# Patient Record
Sex: Female | Born: 1945 | Race: White | Hispanic: No | Marital: Married | Smoking: Never smoker
Health system: Southern US, Community
[De-identification: ages and names within clinical notes are randomized; demographics above are authoritative.]

## PROBLEM LIST (undated history)

## (undated) DIAGNOSIS — Z9889 Other specified postprocedural states: Secondary | ICD-10-CM

## (undated) DIAGNOSIS — M545 Low back pain, unspecified: Secondary | ICD-10-CM

## (undated) DIAGNOSIS — R740 Nonspecific elevation of levels of transaminase and lactic acid dehydrogenase [LDH]: Secondary | ICD-10-CM

## (undated) DIAGNOSIS — R002 Palpitations: Secondary | ICD-10-CM

## (undated) DIAGNOSIS — R142 Eructation: Secondary | ICD-10-CM

## (undated) DIAGNOSIS — M2550 Pain in unspecified joint: Secondary | ICD-10-CM

## (undated) DIAGNOSIS — R7309 Other abnormal glucose: Secondary | ICD-10-CM

## (undated) DIAGNOSIS — F411 Generalized anxiety disorder: Secondary | ICD-10-CM

## (undated) DIAGNOSIS — R5383 Other fatigue: Secondary | ICD-10-CM

## (undated) DIAGNOSIS — Z87440 Personal history of urinary (tract) infections: Secondary | ICD-10-CM

## (undated) DIAGNOSIS — R0789 Other chest pain: Secondary | ICD-10-CM

## (undated) DIAGNOSIS — R7401 Elevation of levels of liver transaminase levels: Secondary | ICD-10-CM

## (undated) DIAGNOSIS — K449 Diaphragmatic hernia without obstruction or gangrene: Secondary | ICD-10-CM

## (undated) DIAGNOSIS — R143 Flatulence: Secondary | ICD-10-CM

## (undated) DIAGNOSIS — R112 Nausea with vomiting, unspecified: Secondary | ICD-10-CM

## (undated) DIAGNOSIS — C679 Malignant neoplasm of bladder, unspecified: Secondary | ICD-10-CM

## (undated) DIAGNOSIS — R141 Gas pain: Secondary | ICD-10-CM

## (undated) DIAGNOSIS — R269 Unspecified abnormalities of gait and mobility: Secondary | ICD-10-CM

## (undated) DIAGNOSIS — R5381 Other malaise: Secondary | ICD-10-CM

## (undated) DIAGNOSIS — F0280 Dementia in other diseases classified elsewhere without behavioral disturbance: Secondary | ICD-10-CM

## (undated) DIAGNOSIS — K219 Gastro-esophageal reflux disease without esophagitis: Secondary | ICD-10-CM

## (undated) DIAGNOSIS — N3281 Overactive bladder: Secondary | ICD-10-CM

## (undated) DIAGNOSIS — R1013 Epigastric pain: Secondary | ICD-10-CM

## (undated) DIAGNOSIS — E785 Hyperlipidemia, unspecified: Secondary | ICD-10-CM

## (undated) DIAGNOSIS — G2581 Restless legs syndrome: Secondary | ICD-10-CM

## (undated) DIAGNOSIS — G3183 Dementia with Lewy bodies: Secondary | ICD-10-CM

## (undated) DIAGNOSIS — Z8669 Personal history of other diseases of the nervous system and sense organs: Secondary | ICD-10-CM

## (undated) DIAGNOSIS — M1990 Unspecified osteoarthritis, unspecified site: Secondary | ICD-10-CM

## (undated) DIAGNOSIS — R7402 Elevation of levels of lactic acid dehydrogenase (LDH): Secondary | ICD-10-CM

## (undated) DIAGNOSIS — M542 Cervicalgia: Secondary | ICD-10-CM

## (undated) DIAGNOSIS — E559 Vitamin D deficiency, unspecified: Secondary | ICD-10-CM

## (undated) DIAGNOSIS — K589 Irritable bowel syndrome without diarrhea: Secondary | ICD-10-CM

## (undated) HISTORY — DX: Other fatigue: R53.83

## (undated) HISTORY — DX: Cervicalgia: M54.2

## (undated) HISTORY — DX: Gastro-esophageal reflux disease without esophagitis: K21.9

## (undated) HISTORY — DX: Other chest pain: R07.89

## (undated) HISTORY — DX: Eructation: R14.2

## (undated) HISTORY — DX: Flatulence: R14.3

## (undated) HISTORY — PX: APPENDECTOMY: SHX54

## (undated) HISTORY — PX: TRIGGER FINGER RELEASE: SHX641

## (undated) HISTORY — PX: OTHER SURGICAL HISTORY: SHX169

## (undated) HISTORY — DX: Generalized anxiety disorder: F41.1

## (undated) HISTORY — DX: Epigastric pain: R10.13

## (undated) HISTORY — DX: Pain in unspecified joint: M25.50

## (undated) HISTORY — DX: Restless legs syndrome: G25.81

## (undated) HISTORY — DX: Overactive bladder: N32.81

## (undated) HISTORY — PX: BLADDER TUMOR EXCISION: SHX238

## (undated) HISTORY — DX: Malignant neoplasm of bladder, unspecified: C67.9

## (undated) HISTORY — DX: Personal history of other diseases of the nervous system and sense organs: Z86.69

## (undated) HISTORY — DX: Low back pain: M54.5

## (undated) HISTORY — DX: Other malaise: R53.81

## (undated) HISTORY — DX: Unspecified abnormalities of gait and mobility: R26.9

## (undated) HISTORY — DX: Vitamin D deficiency, unspecified: E55.9

## (undated) HISTORY — PX: LUMBAR LAMINECTOMY: SHX95

## (undated) HISTORY — DX: Irritable bowel syndrome, unspecified: K58.9

## (undated) HISTORY — DX: Palpitations: R00.2

## (undated) HISTORY — DX: Hyperlipidemia, unspecified: E78.5

## (undated) HISTORY — DX: Diaphragmatic hernia without obstruction or gangrene: K44.9

## (undated) HISTORY — PX: UPPER GI ENDOSCOPY: SHX6162

## (undated) HISTORY — DX: Low back pain, unspecified: M54.50

## (undated) HISTORY — DX: Elevation of levels of lactic acid dehydrogenase (LDH): R74.02

## (undated) HISTORY — DX: Dementia in other diseases classified elsewhere, unspecified severity, without behavioral disturbance, psychotic disturbance, mood disturbance, and anxiety: F02.80

## (undated) HISTORY — DX: Flatulence: R14.1

## (undated) HISTORY — PX: BUNIONECTOMY: SHX129

## (undated) HISTORY — DX: Dementia with Lewy bodies: G31.83

## (undated) HISTORY — DX: Elevation of levels of liver transaminase levels: R74.01

## (undated) HISTORY — DX: Nonspecific elevation of levels of transaminase and lactic acid dehydrogenase (ldh): R74.0

## (undated) HISTORY — DX: Other abnormal glucose: R73.09

## (undated) HISTORY — PX: BREAST SURGERY: SHX581

---

## 1998-06-04 ENCOUNTER — Other Ambulatory Visit: Admission: RE | Admit: 1998-06-04 | Discharge: 1998-06-04 | Payer: Self-pay | Admitting: Obstetrics and Gynecology

## 1998-10-04 ENCOUNTER — Other Ambulatory Visit: Admission: RE | Admit: 1998-10-04 | Discharge: 1998-10-04 | Payer: Self-pay | Admitting: Gastroenterology

## 1998-10-04 ENCOUNTER — Encounter (INDEPENDENT_AMBULATORY_CARE_PROVIDER_SITE_OTHER): Payer: Self-pay

## 1998-12-13 ENCOUNTER — Encounter: Admission: RE | Admit: 1998-12-13 | Discharge: 1999-03-13 | Payer: Self-pay | Admitting: Gastroenterology

## 1999-10-11 ENCOUNTER — Other Ambulatory Visit: Admission: RE | Admit: 1999-10-11 | Discharge: 1999-10-11 | Payer: Self-pay | Admitting: Obstetrics and Gynecology

## 2001-02-19 ENCOUNTER — Other Ambulatory Visit: Admission: RE | Admit: 2001-02-19 | Discharge: 2001-02-19 | Payer: Self-pay | Admitting: Obstetrics and Gynecology

## 2003-01-23 ENCOUNTER — Other Ambulatory Visit: Admission: RE | Admit: 2003-01-23 | Discharge: 2003-01-23 | Payer: Self-pay | Admitting: Obstetrics and Gynecology

## 2004-03-02 ENCOUNTER — Other Ambulatory Visit: Admission: RE | Admit: 2004-03-02 | Discharge: 2004-03-02 | Payer: Self-pay | Admitting: Obstetrics and Gynecology

## 2004-03-08 ENCOUNTER — Ambulatory Visit: Payer: Self-pay | Admitting: Gastroenterology

## 2004-04-04 ENCOUNTER — Ambulatory Visit: Payer: Self-pay | Admitting: Gastroenterology

## 2004-04-22 ENCOUNTER — Ambulatory Visit: Payer: Self-pay | Admitting: Gastroenterology

## 2004-05-06 ENCOUNTER — Ambulatory Visit: Payer: Self-pay | Admitting: Gastroenterology

## 2004-11-21 ENCOUNTER — Ambulatory Visit: Payer: Self-pay | Admitting: Internal Medicine

## 2005-02-06 ENCOUNTER — Encounter (INDEPENDENT_AMBULATORY_CARE_PROVIDER_SITE_OTHER): Payer: Self-pay | Admitting: *Deleted

## 2005-03-06 ENCOUNTER — Other Ambulatory Visit: Admission: RE | Admit: 2005-03-06 | Discharge: 2005-03-06 | Payer: Self-pay | Admitting: Obstetrics and Gynecology

## 2005-09-27 ENCOUNTER — Ambulatory Visit: Payer: Self-pay | Admitting: Internal Medicine

## 2005-10-04 ENCOUNTER — Ambulatory Visit: Payer: Self-pay

## 2005-10-16 ENCOUNTER — Ambulatory Visit: Payer: Self-pay | Admitting: Internal Medicine

## 2006-04-05 ENCOUNTER — Other Ambulatory Visit: Admission: RE | Admit: 2006-04-05 | Discharge: 2006-04-05 | Payer: Self-pay | Admitting: Obstetrics and Gynecology

## 2006-05-01 ENCOUNTER — Encounter: Admission: RE | Admit: 2006-05-01 | Discharge: 2006-05-01 | Payer: Self-pay | Admitting: Obstetrics and Gynecology

## 2006-05-09 ENCOUNTER — Ambulatory Visit: Payer: Self-pay | Admitting: Internal Medicine

## 2006-05-11 ENCOUNTER — Ambulatory Visit: Payer: Self-pay | Admitting: Internal Medicine

## 2006-05-11 LAB — CONVERTED CEMR LAB
ALT: 32 units/L (ref 0–40)
Total CHOL/HDL Ratio: 3.2
Triglycerides: 117 mg/dL (ref 0–149)
VLDL: 23 mg/dL (ref 0–40)

## 2006-05-14 ENCOUNTER — Ambulatory Visit: Payer: Self-pay | Admitting: Internal Medicine

## 2006-12-13 ENCOUNTER — Telehealth (INDEPENDENT_AMBULATORY_CARE_PROVIDER_SITE_OTHER): Payer: Self-pay | Admitting: *Deleted

## 2007-01-07 ENCOUNTER — Encounter (INDEPENDENT_AMBULATORY_CARE_PROVIDER_SITE_OTHER): Payer: Self-pay | Admitting: *Deleted

## 2007-01-07 DIAGNOSIS — E785 Hyperlipidemia, unspecified: Secondary | ICD-10-CM

## 2007-01-07 DIAGNOSIS — K219 Gastro-esophageal reflux disease without esophagitis: Secondary | ICD-10-CM | POA: Insufficient documentation

## 2007-01-07 DIAGNOSIS — Z8669 Personal history of other diseases of the nervous system and sense organs: Secondary | ICD-10-CM | POA: Insufficient documentation

## 2007-04-02 ENCOUNTER — Encounter: Admission: RE | Admit: 2007-04-02 | Discharge: 2007-04-02 | Payer: Self-pay | Admitting: Obstetrics and Gynecology

## 2007-04-15 ENCOUNTER — Other Ambulatory Visit: Admission: RE | Admit: 2007-04-15 | Discharge: 2007-04-15 | Payer: Self-pay | Admitting: Obstetrics and Gynecology

## 2007-04-26 ENCOUNTER — Ambulatory Visit: Payer: Self-pay | Admitting: Internal Medicine

## 2007-04-26 DIAGNOSIS — R0789 Other chest pain: Secondary | ICD-10-CM | POA: Insufficient documentation

## 2007-04-26 DIAGNOSIS — R002 Palpitations: Secondary | ICD-10-CM | POA: Insufficient documentation

## 2007-04-29 ENCOUNTER — Encounter (INDEPENDENT_AMBULATORY_CARE_PROVIDER_SITE_OTHER): Payer: Self-pay | Admitting: *Deleted

## 2007-05-06 ENCOUNTER — Encounter (INDEPENDENT_AMBULATORY_CARE_PROVIDER_SITE_OTHER): Payer: Self-pay | Admitting: *Deleted

## 2007-09-12 ENCOUNTER — Telehealth (INDEPENDENT_AMBULATORY_CARE_PROVIDER_SITE_OTHER): Payer: Self-pay | Admitting: *Deleted

## 2008-05-05 ENCOUNTER — Encounter: Admission: RE | Admit: 2008-05-05 | Discharge: 2008-05-05 | Payer: Self-pay | Admitting: Obstetrics and Gynecology

## 2008-05-13 ENCOUNTER — Other Ambulatory Visit: Admission: RE | Admit: 2008-05-13 | Discharge: 2008-05-13 | Payer: Self-pay | Admitting: Obstetrics and Gynecology

## 2008-05-13 ENCOUNTER — Encounter: Payer: Self-pay | Admitting: Obstetrics and Gynecology

## 2008-05-13 ENCOUNTER — Ambulatory Visit: Payer: Self-pay | Admitting: Obstetrics and Gynecology

## 2008-06-24 ENCOUNTER — Telehealth (INDEPENDENT_AMBULATORY_CARE_PROVIDER_SITE_OTHER): Payer: Self-pay | Admitting: *Deleted

## 2008-06-25 ENCOUNTER — Telehealth (INDEPENDENT_AMBULATORY_CARE_PROVIDER_SITE_OTHER): Payer: Self-pay | Admitting: *Deleted

## 2008-07-01 ENCOUNTER — Ambulatory Visit: Payer: Self-pay | Admitting: Internal Medicine

## 2008-07-01 LAB — CONVERTED CEMR LAB
Bilirubin Urine: NEGATIVE
Ketones, urine, test strip: NEGATIVE
Specific Gravity, Urine: <1.005
Urobilinogen, UA: 0.2
pH: 8

## 2008-07-05 LAB — CONVERTED CEMR LAB
ALT: 127 units/L — ABNORMAL HIGH (ref 0–35)
AST: 72 units/L — ABNORMAL HIGH (ref 0–37)
Albumin: 4.2 g/dL (ref 3.5–5.2)
Alkaline Phosphatase: 83 units/L (ref 39–117)
Basophils Absolute: 0 10*3/uL (ref 0.0–0.1)
Calcium: 9.4 mg/dL (ref 8.4–10.5)
Cholesterol: 196 mg/dL (ref 0–200)
Creatinine, Ser: 0.8 mg/dL (ref 0.4–1.2)
Eosinophils Absolute: 0.1 10*3/uL (ref 0.0–0.7)
Hgb A1c MFr Bld: 5.9 % (ref 4.6–6.5)
Lymphocytes Relative: 36.6 % (ref 12.0–46.0)
Lymphs Abs: 1.7 10*3/uL (ref 0.7–4.0)
MCHC: 35.1 g/dL (ref 30.0–36.0)
Monocytes Relative: 6.6 % (ref 3.0–12.0)
Platelets: 244 10*3/uL (ref 150.0–400.0)
RDW: 12.9 % (ref 11.5–14.6)
TSH: 2.04 microintl units/mL (ref 0.35–5.50)
Total Protein: 7.1 g/dL (ref 6.0–8.3)
Triglycerides: 170 mg/dL — ABNORMAL HIGH (ref 0.0–149.0)
Vitamin B-12: 240 pg/mL (ref 211–911)

## 2008-07-07 ENCOUNTER — Encounter (INDEPENDENT_AMBULATORY_CARE_PROVIDER_SITE_OTHER): Payer: Self-pay | Admitting: *Deleted

## 2008-07-20 ENCOUNTER — Ambulatory Visit: Payer: Self-pay | Admitting: Internal Medicine

## 2008-07-20 DIAGNOSIS — F411 Generalized anxiety disorder: Secondary | ICD-10-CM | POA: Insufficient documentation

## 2008-07-20 DIAGNOSIS — R5381 Other malaise: Secondary | ICD-10-CM

## 2008-07-20 DIAGNOSIS — R740 Nonspecific elevation of levels of transaminase and lactic acid dehydrogenase [LDH]: Secondary | ICD-10-CM

## 2008-07-20 DIAGNOSIS — R5383 Other fatigue: Secondary | ICD-10-CM

## 2008-08-04 ENCOUNTER — Ambulatory Visit: Payer: Self-pay | Admitting: Internal Medicine

## 2008-08-05 LAB — CONVERTED CEMR LAB
Albumin: 4.1 g/dL (ref 3.5–5.2)
Alkaline Phosphatase: 50 units/L (ref 39–117)
Bilirubin, Direct: 0.1 mg/dL (ref 0.0–0.3)

## 2008-08-06 ENCOUNTER — Ambulatory Visit: Payer: Self-pay | Admitting: Internal Medicine

## 2008-08-06 ENCOUNTER — Encounter (INDEPENDENT_AMBULATORY_CARE_PROVIDER_SITE_OTHER): Payer: Self-pay | Admitting: *Deleted

## 2008-08-06 LAB — CONVERTED CEMR LAB
HDL goal, serum: 50 mg/dL
LDL Goal: 100 mg/dL

## 2008-10-22 ENCOUNTER — Ambulatory Visit: Payer: Self-pay | Admitting: Internal Medicine

## 2008-10-22 DIAGNOSIS — M542 Cervicalgia: Secondary | ICD-10-CM

## 2008-10-22 DIAGNOSIS — M545 Low back pain: Secondary | ICD-10-CM

## 2008-10-26 ENCOUNTER — Encounter (INDEPENDENT_AMBULATORY_CARE_PROVIDER_SITE_OTHER): Payer: Self-pay | Admitting: *Deleted

## 2008-10-27 ENCOUNTER — Telehealth (INDEPENDENT_AMBULATORY_CARE_PROVIDER_SITE_OTHER): Payer: Self-pay | Admitting: *Deleted

## 2009-02-06 DIAGNOSIS — C679 Malignant neoplasm of bladder, unspecified: Secondary | ICD-10-CM

## 2009-02-06 HISTORY — DX: Malignant neoplasm of bladder, unspecified: C67.9

## 2009-02-06 HISTORY — PX: COLONOSCOPY: SHX174

## 2009-06-29 ENCOUNTER — Encounter: Admission: RE | Admit: 2009-06-29 | Discharge: 2009-06-29 | Payer: Self-pay | Admitting: Obstetrics and Gynecology

## 2009-07-07 ENCOUNTER — Ambulatory Visit: Payer: Self-pay | Admitting: Obstetrics and Gynecology

## 2009-07-07 ENCOUNTER — Other Ambulatory Visit: Admission: RE | Admit: 2009-07-07 | Discharge: 2009-07-07 | Payer: Self-pay | Admitting: Obstetrics and Gynecology

## 2009-07-21 ENCOUNTER — Ambulatory Visit: Payer: Self-pay | Admitting: Internal Medicine

## 2009-08-18 ENCOUNTER — Encounter: Payer: Self-pay | Admitting: Internal Medicine

## 2009-09-07 ENCOUNTER — Ambulatory Visit: Payer: Self-pay | Admitting: Obstetrics and Gynecology

## 2009-09-23 ENCOUNTER — Ambulatory Visit: Payer: Self-pay | Admitting: Internal Medicine

## 2009-09-23 ENCOUNTER — Encounter: Payer: Self-pay | Admitting: Internal Medicine

## 2009-09-23 DIAGNOSIS — R7309 Other abnormal glucose: Secondary | ICD-10-CM

## 2009-09-23 DIAGNOSIS — M2550 Pain in unspecified joint: Secondary | ICD-10-CM | POA: Insufficient documentation

## 2009-09-23 DIAGNOSIS — R269 Unspecified abnormalities of gait and mobility: Secondary | ICD-10-CM | POA: Insufficient documentation

## 2009-09-24 LAB — CONVERTED CEMR LAB: Hgb A1c MFr Bld: 5.9 % (ref 4.6–6.5)

## 2009-10-04 ENCOUNTER — Encounter (INDEPENDENT_AMBULATORY_CARE_PROVIDER_SITE_OTHER): Payer: Self-pay | Admitting: *Deleted

## 2009-11-08 ENCOUNTER — Ambulatory Visit: Payer: Self-pay | Admitting: Internal Medicine

## 2009-11-08 LAB — CONVERTED CEMR LAB
Bilirubin Urine: NEGATIVE
Nitrite: NEGATIVE
Protein, U semiquant: NEGATIVE
Urobilinogen, UA: 0.2

## 2009-11-09 ENCOUNTER — Encounter: Payer: Self-pay | Admitting: Internal Medicine

## 2009-11-10 LAB — CONVERTED CEMR LAB
Albumin: 4.4 g/dL (ref 3.5–5.2)
BUN: 15 mg/dL (ref 6–23)
Basophils Absolute: 0 10*3/uL (ref 0.0–0.1)
CO2: 26 meq/L (ref 19–32)
Calcium: 9.3 mg/dL (ref 8.4–10.5)
Eosinophils Absolute: 0.1 10*3/uL (ref 0.0–0.7)
GFR calc non Af Amer: 64.12 mL/min (ref >60)
Glucose, Bld: 102 mg/dL — ABNORMAL HIGH (ref 70–99)
HCT: 41.8 % (ref 36.0–46.0)
Hemoglobin: 14.4 g/dL (ref 12.0–15.0)
Lymphs Abs: 2.1 10*3/uL (ref 0.7–4.0)
MCHC: 34.3 g/dL (ref 30.0–36.0)
Neutro Abs: 3.6 10*3/uL (ref 1.4–7.7)
Platelets: 295 10*3/uL (ref 150.0–400.0)
Potassium: 4.2 meq/L (ref 3.5–5.1)
RDW: 13.3 % (ref 11.5–14.6)
Sodium: 140 meq/L (ref 135–145)
TSH: 0.93 microintl units/mL (ref 0.35–5.50)
Total Bilirubin: 0.5 mg/dL (ref 0.3–1.2)

## 2009-11-11 ENCOUNTER — Telehealth (INDEPENDENT_AMBULATORY_CARE_PROVIDER_SITE_OTHER): Payer: Self-pay | Admitting: *Deleted

## 2009-12-02 ENCOUNTER — Ambulatory Visit: Payer: Self-pay | Admitting: Internal Medicine

## 2009-12-02 DIAGNOSIS — R142 Eructation: Secondary | ICD-10-CM

## 2009-12-02 DIAGNOSIS — R143 Flatulence: Secondary | ICD-10-CM

## 2009-12-02 DIAGNOSIS — K589 Irritable bowel syndrome without diarrhea: Secondary | ICD-10-CM

## 2009-12-02 DIAGNOSIS — R141 Gas pain: Secondary | ICD-10-CM

## 2009-12-06 LAB — CONVERTED CEMR LAB
Cholesterol: 220 mg/dL — ABNORMAL HIGH (ref 0–200)
HDL: 45.5 mg/dL (ref >39.00)
Triglycerides: 163 mg/dL — ABNORMAL HIGH (ref 0.0–149.0)
VLDL: 32.6 mg/dL (ref 0.0–40.0)

## 2009-12-07 ENCOUNTER — Encounter: Admission: RE | Admit: 2009-12-07 | Discharge: 2009-12-07 | Payer: Self-pay | Admitting: Internal Medicine

## 2009-12-13 ENCOUNTER — Telehealth (INDEPENDENT_AMBULATORY_CARE_PROVIDER_SITE_OTHER): Payer: Self-pay | Admitting: *Deleted

## 2009-12-14 ENCOUNTER — Ambulatory Visit: Payer: Self-pay | Admitting: Internal Medicine

## 2009-12-17 LAB — CONVERTED CEMR LAB
Ketones, ur: NEGATIVE mg/dL
Leukocytes, UA: NEGATIVE
Specific Gravity, Urine: 1.025 (ref 1.000–1.030)
Total Protein, Urine: NEGATIVE mg/dL
Urine Glucose: NEGATIVE mg/dL
pH: 6 (ref 5.0–8.0)

## 2009-12-20 ENCOUNTER — Ambulatory Visit: Payer: Self-pay | Admitting: Cardiology

## 2009-12-20 ENCOUNTER — Encounter: Payer: Self-pay | Admitting: Internal Medicine

## 2009-12-20 DIAGNOSIS — N329 Bladder disorder, unspecified: Secondary | ICD-10-CM | POA: Insufficient documentation

## 2009-12-23 ENCOUNTER — Telehealth: Payer: Self-pay | Admitting: Internal Medicine

## 2010-01-18 ENCOUNTER — Encounter: Payer: Self-pay | Admitting: Internal Medicine

## 2010-01-20 ENCOUNTER — Encounter: Payer: Self-pay | Admitting: Internal Medicine

## 2010-01-24 ENCOUNTER — Ambulatory Visit
Admission: RE | Admit: 2010-01-24 | Discharge: 2010-01-24 | Payer: Self-pay | Source: Home / Self Care | Attending: Urology | Admitting: Urology

## 2010-01-28 ENCOUNTER — Ambulatory Visit: Payer: Self-pay | Admitting: Internal Medicine

## 2010-01-28 DIAGNOSIS — R1013 Epigastric pain: Secondary | ICD-10-CM | POA: Insufficient documentation

## 2010-02-01 LAB — CONVERTED CEMR LAB
H Pylori IgG: NEGATIVE
Lipase: 29 units/L (ref 11.0–59.0)

## 2010-02-03 ENCOUNTER — Encounter: Payer: Self-pay | Admitting: Internal Medicine

## 2010-03-06 LAB — CONVERTED CEMR LAB
ALT: 34 units/L (ref 0–35)
AST: 22 units/L (ref 0–37)
BUN: 16 mg/dL (ref 6–23)
Basophils Absolute: 0 10*3/uL (ref 0.0–0.1)
Basophils Relative: 0.6 % (ref 0.0–1.0)
Bilirubin, Direct: 0.1 mg/dL (ref 0.0–0.3)
Bilirubin, Direct: 0.1 mg/dL (ref 0.0–0.3)
CO2: 32 meq/L (ref 19–32)
Calcium: 9.9 mg/dL (ref 8.4–10.5)
Chloride: 103 meq/L (ref 96–112)
Chloride: 99 meq/L (ref 96–112)
Cholesterol: 217 mg/dL — ABNORMAL HIGH (ref 0–200)
Creatinine, Ser: 0.7 mg/dL (ref 0.4–1.2)
Direct LDL: 133.6 mg/dL
Eosinophils Absolute: 0 10*3/uL (ref 0.0–0.7)
Eosinophils Relative: 0.5 % (ref 0.0–5.0)
GFR calc non Af Amer: 68 mL/min
Glucose, Bld: 103 mg/dL — ABNORMAL HIGH (ref 70–99)
Glucose, Bld: 106 mg/dL — ABNORMAL HIGH (ref 70–99)
HCT: 42.6 % (ref 36.0–46.0)
HCT: 44.8 % (ref 36.0–46.0)
Hemoglobin: 15 g/dL (ref 12.0–15.0)
LDL Cholesterol: 87 mg/dL (ref 0–99)
Lymphs Abs: 1.8 10*3/uL (ref 0.7–4.0)
MCHC: 34.7 g/dL (ref 30.0–36.0)
MCV: 89.7 fL (ref 78.0–100.0)
Monocytes Absolute: 0.3 10*3/uL (ref 0.1–1.0)
Monocytes Absolute: 0.3 10*3/uL (ref 0.2–0.7)
Neutrophils Relative %: 61.3 % (ref 43.0–77.0)
Neutrophils Relative %: 69.6 % (ref 43.0–77.0)
Platelets: 263 10*3/uL (ref 150.0–400.0)
RDW: 12.5 % (ref 11.5–14.6)
RDW: 13.6 % (ref 11.5–14.6)
Rhuematoid fact SerPl-aCnc: 25.2 intl units/mL — ABNORMAL HIGH (ref 0.0–20.0)
Sed Rate: 10 mm/hr (ref 0–22)
TSH: 1.09 microintl units/mL (ref 0.35–5.50)
TSH: 1.22 microintl units/mL (ref 0.35–5.50)
Total Bilirubin: 0.7 mg/dL (ref 0.3–1.2)
VLDL: 25 mg/dL (ref 0–40)
VLDL: 41.4 mg/dL — ABNORMAL HIGH (ref 0.0–40.0)
Vit D, 1,25-Dihydroxy: 41 (ref 30–89)
Vitamin B-12: 182 pg/mL — ABNORMAL LOW (ref 211–911)
WBC: 5.5 10*3/uL (ref 4.5–10.5)
WBC: 6.4 10*3/uL (ref 4.5–10.5)

## 2010-03-08 ENCOUNTER — Telehealth (INDEPENDENT_AMBULATORY_CARE_PROVIDER_SITE_OTHER): Payer: Self-pay | Admitting: *Deleted

## 2010-03-08 NOTE — Miscellaneous (Signed)
Summary: Orders Update  Clinical Lists Changes  Problems: Added new problem of HYPERGLYCEMIA, FASTING (ICD-790.29) Added new problem of ARTHRALGIA (ICD-719.40) Orders: Added new Service order of Venipuncture (16109) - Signed Added new Test order of TLB-A1C / Hgb A1C (Glycohemoglobin) (83036-A1C) - Signed Added new Test order of T- * Misc. Laboratory test [phone]) - Signed

## 2010-03-08 NOTE — Progress Notes (Signed)
Summary: Culture Results  Phone Note Outgoing Call Call back at Home Phone [phone]   Call placed by: Shonna Chock CMA,  November 11, 2009 4:46 PM Call placed to: Patient Details for Reason: Culture Results Summary of Call: Left message on machine(Home Number) for patient to return call when avaliable, Reason for call:   Significant UTI would have > 100,000 colonies. Cystitis suggested. Recommend generic Septra DS two times a day # 6 if still having dysuria. Force water up to 40 oz water/ day.   Shonna Chock CMA  November 11, 2009 4:47 PM   Follow-up for Phone Call        I called patient on [phone] number (cell) and discussed culture results. Patient in [place], Patient will try to locate a pharmacy and call back with their number Follow-up by: Shonna Chock CMA,  November 11, 2009 4:50 PM  Additional Follow-up for Phone Call Additional follow up Details #1::        I called patient back because the phones was shut off, Patient gave me the pharmacy number [phone], I called rx in (spoke with [name]) Additional Follow-up by: Shonna Chock CMA,  November 11, 2009 5:04 PM    New/Updated Medications: SEPTRA DS 800-160 MG TABS (SULFAMETHOXAZOLE-TRIMETHOPRIM) 1 by mouth two times a day

## 2010-03-08 NOTE — Miscellaneous (Signed)
Summary: Orders Update  Clinical Lists Changes  Problems: Added new problem of LESION, BLADDER (ICD-596.9) - Signed Orders: Added new Referral order of Urology Referral (Urology) - Signed

## 2010-03-08 NOTE — Consult Note (Signed)
Summary: [facility]  [facility]   Imported By: Lennie Odor 09/02/2009 09:58:30  _____________________________________________________________________  External Attachment:    Type:   Image     Comment:   External Document

## 2010-03-08 NOTE — Progress Notes (Signed)
Summary: Symptoms--Call from sister  Phone Note Call from Patient   Summary of Call: Patient sister called stating that she is calling against the pt wishes, but she is worried about her sister. She states that the patient has multiple vague symptoms/issues. She states that the patient was probably not up front with her multitude of symptoms. She states that the patient has been suffering from memory issues/forgetfulness, tremors, fatigue, upper abd pain/burning, bloating, and decreased eating.   Patient sister was advised that I can pass the information along, but we cannot discuss these issues or the patient care. She expressed understanding. I advised that she talk with her sister in order to come to visits with her or have her sister sign the DPR form stating that we can speak with her sister.  She is aware that until that time, we cannot violate the patients privacy rights. Initial call taken by: Lucious Groves CMA,  December 23, 2009 12:26 PM  Follow-up for Phone Call        Neurology appt is pending. The first appt was cancelled by Thayer Ohm due to  "diagnosis" & insurance concerns Follow-up by: Marga Melnick MD,  December 24, 2009 6:21 AM

## 2010-03-08 NOTE — Progress Notes (Signed)
Summary: XR results  Phone Note Call from Patient Call back at Home Phone [phone]   Summary of Call: Patient called for XR results. Please advise. Initial call taken by: Lucious Groves CMA,  December 13, 2009 4:45 PM  Follow-up for Phone Call        I apologize; when I saw vaginal ultrsound I thought this was study ordered by her Gyn. Most importantly there are no ovarian issues. Abenign 1/2inch fibroid of uterus is present. Incidentally a tiny (13 mm... there are 254 mm in an inch) bladder lesion is present. Neither of these should cause symptoms, but a CT of the bladder should be done simply to be cautios. Also please collect a urinalysis for completeness sake. Follow-up by: Marga Melnick MD,  December 14, 2009 5:48 AM  Additional Follow-up for Phone Call Additional follow up Details #1::        Patient aware of results, ok'd all information. Scheduled appointment for elam to do clean catch UA. Additional Follow-up by: Shonna Chock CMA,  December 14, 2009 11:29 AM

## 2010-03-08 NOTE — Assessment & Plan Note (Signed)
Summary: Overall not feeling well/scm   Vital Signs:  Patient profile:   65 year old female Weight:      167.2 pounds BMI:     27.92 Temp:     98.4 degrees F oral Pulse rate:   80 / minute Resp:     14 per minute BP sitting:   118 / 74  (left arm) Cuff size:   large  Vitals Entered By: Shonna Chock CMA (December 02, 2009 9:22 AM) CC: Overall not feeling well. Patient states " Something is not right, I am not myself and other people noticed it." , Fatigue, Heartburn, Abdominal pain   CC:  Overall not feeling well. Patient states " Something is not right, I am not myself and other people noticed it." , Fatigue, Heartburn, and Abdominal pain.  History of Present Illness:      This is a 65 year old woman who presents with multiple symptoms including fatigue.  The patient reports persistent fatigue.  The patient denies fever, night sweats, weight loss, exertional chest pain, dyspnea, cough, and hemoptysis.  The patient denies the following symptoms: leg swelling, orthopnea, PND, melena, adenopathy, severe snoring, daytime sleepiness, and skin changes.       The patient reports acid reflux, but denies sour taste in mouth, epigastric pain, and trouble swallowing.  The patient denies the following alarm features: dysphagia, hematemesis, and vomiting.  Symptoms are worse with spicy foods & lying down.  Prior evaluation has included EGD remotely.  The patient has found the following treatments to be effective: an antacid ( TUMS).        The patient also presents with abdominal  discomfort & bloating.Last Gyn exam < 6 months ; no issues found. She is unsure if bloating were present then.  The patient reports nausea,  intermittent diarrhea & constipation from IBS, and slight  anorexia.  The location of the pain is suprapubic.  The pain is described as intermittent, dull to  burning in quality.  The patient denies the following symptoms: dysuria, jaundice, dark urine, and vaginal bleeding.  Labs done 10/03  reviewed ;glucose non fasting was 102. Lipids due.  Current Medications (verified): 1)  Valium 5 Mg  Tabs (Diazepam) .... Take 1 Tablet By Mouth As Needed 2)  Ambien 5 Mg  Tabs (Zolpidem Tartrate) .Marland Kitchen.. 1 By Mouth Hs As Needed  Allergies: 1)  ! Prilosec 2)  ! Effexor 3)  ! Simvastatin  Review of Systems General:  Complains of sweats; denies chills. Eyes:  Denies blurring, double vision, and vision loss-both eyes. ENT:  Denies hoarseness. CV:  Complains of palpitations. GU:  Denies discharge and hematuria. Derm:  Denies changes in nail beds, dryness, and hair loss. Neuro:  Tingling in neck & LLE; Neurology appt pending. Heme:  Denies abnormal bruising and bleeding.  Physical Exam  General:  well-nourished,in no acute distress; alert,appropriate and cooperative throughout examination Eyes:  No corneal or conjunctival inflammation noted. Nolid lag Perrla.  Mouth:  Oral mucosa and oropharynx without lesions or exudates.  Teeth in good repair. No pharyngeal erythema.   Neck:  No deformities, masses, or tenderness noted. Lungs:  Normal respiratory effort, chest expands symmetrically. Lungs are clear to auscultation, no crackles or wheezes. Heart:  Normal rate and regular rhythm. S1 and S2 normal without gallop, murmur, click, rub or other extra sounds. Abdomen:  Bowel sounds positive,abdomen soft and non-tender without masses, organomegaly or hernias noted. Rectal:  Stool cards given to her by Dr Eda Paschal Pulses:  R and L carotid,radial,dorsalis pedis and posterior tibial pulses are full and equal bilaterally Extremities:  No clubbing, cyanosis, edema Neurologic:  alert & oriented X3 and DTRs symmetrical and normal.   Skin:  Intact without suspicious lesions or rashes Cervical Nodes:  No lymphadenopathy noted Axillary Nodes:  No palpable lymphadenopathy Psych:  memory intact for recent and remote, flat affect, and subdued.     Impression & Recommendations:  Problem # 1:  FATIGUE  (ICD-780.79)  Problem # 2:  IRRITABLE BOWEL SYNDROME (ICD-564.1)  Orders: Radiology Referral (Radiology)  Problem # 3:  ABDOMINAL BLOATING (ICD-787.3)  Orders: Radiology Referral (Radiology)  Problem # 4:  HYPERGLYCEMIA, FASTING (ICD-790.29)  Orders: Venipuncture (75643) TLB-A1C / Hgb A1C (Glycohemoglobin) (83036-A1C)  Complete Medication List: 1)  Valium 5 Mg Tabs (Diazepam) .... Take 1 tablet by mouth as needed 2)  Ambien 5 Mg Tabs (Zolpidem tartrate) .Marland Kitchen.. 1 by mouth hs as needed 3)  Hyoscyamine Sulfate 0.125 Mg Subl (Hyoscyamine sulfate) .Marland Kitchen.. 1 every 6 hrs as needed for bloating  Other Orders: TLB-Lipid Panel (80061-LIPID)  Patient Instructions: 1)  Complete stool cards . Align once daily until bowels are normal. Prescriptions: HYOSCYAMINE SULFATE 0.125 MG SUBL (HYOSCYAMINE SULFATE) 1 every 6 hrs as needed for bloating  #30 x 1   Entered and Authorized by:   Marga Melnick MD   Signed by:   Marga Melnick MD on 12/02/2009   Method used:   Print then Give to Patient   RxID:   [id number]    Orders Added: 1)  Est. Patient Level IV [60109] 2)  Venipuncture [32355] 3)  TLB-Lipid Panel [80061-LIPID] 4)  TLB-A1C / Hgb A1C (Glycohemoglobin) [83036-A1C] 5)  Radiology Referral [Radiology]

## 2010-03-08 NOTE — Assessment & Plan Note (Signed)
Summary: burning when urinating /blood/cbs   Vital Signs:  Patient profile:   65 year old female Weight:      172 pounds Temp:     98.4 degrees F oral Pulse rate:   76 / minute Resp:     16 per minute BP sitting:   144 / 80  (left arm) Cuff size:   large  Vitals Entered By: Shonna Chock CMA (November 08, 2009 2:35 PM) CC: Burning x 1.5 weeks and blood in urine x 1 day (Saturday), Dysuria, Fatigue   CC:  Burning x 1.5 weeks and blood in urine x 1 day (Saturday), Dysuria, and Fatigue.  History of Present Illness: Dysuria      This is a 65 year old woman who presents with Dysuria X 7 days.  The patient reports burning with urination, urinary frequency(also a chronic issue), urgency, and hematuria, but denies vaginal discharge.  Associated symptoms include nausea and back pain( also chronic issue).  The patient denies the following associated symptoms: vomiting, fever, shaking chills, flank pain, abdominal pain, pelvic pain, and arthralgias. Rx: none thus far.The patient denies the following risk factors: diabetes, prior antibiotics, immunosuppression, and history of pyelonephritis.  History is significant for no chronic urinary tract problems. PMH of UTIs X 3-4 with frank hematuria , last  treated  in 2009.Dr Eda Paschal , [facility],  was seen < 6 months ago. Fatigue      The patient also presents with Fatigue.  The patient reports persistent fatigue and fatigue without  exertion.  The patient denies night sweats, weight loss, exertional chest pain, dyspnea, cough, and hemoptysis.  The patient denies the following symptoms: leg swelling, orthopnea, PND, melena, adenopathy, severe snoring, daytime sleepiness, and skin changes.  Depressive symptoms include poor sleep.    Current Medications (verified): 1)  Valium 5 Mg  Tabs (Diazepam) .... Take 1 Tablet By Mouth As Needed 2)  Ambien 5 Mg  Tabs (Zolpidem Tartrate) .Marland Kitchen.. 1 By Mouth Hs As Needed  Allergies: 1)  ! Prilosec 2)  ! Effexor 3)  !  Simvastatin  Review of Systems Derm:  Denies changes in nail beds and hair loss. Neuro:  Complains of disturbances in coordination and poor balance; She deferrred Neurology consult pending long term care  coverage evaluation.  Physical Exam  General:  well-nourished,in no acute distress; alert,appropriate and cooperative throughout examination Head:  Decreased facial expression Neck:  No deformities, masses, or tenderness noted. Lungs:  Normal respiratory effort, chest expands symmetrically. Lungs are clear to auscultation, no crackles or wheezes. Heart:  Normal rate and regular rhythm. S1 and S2 normal without gallop, murmur, click, rub or other extra sounds. Abdomen:  Bowel sounds positive,abdomen soft and non-tender without masses, organomegaly or hernias noted. Msk:  No flank tenderness; negative SLR Extremities:  No cogwheeling Neurologic:  alert & oriented X3, strength normal in all extremities, gait abnormal ( decreased arm swing; gait slightly choppy), and DTRs symmetrical and normal.  Fine tremor of RUE > LUE Skin:  Intact without suspicious lesions or rashes Cervical Nodes:  No lymphadenopathy noted Axillary Nodes:  No palpable lymphadenopathy Psych:  memory intact for recent and remote, flat affect, and subdued.     Impression & Recommendations:  Problem # 1:  DYSURIA (ICD-788.1)  Orders: UA Dipstick w/o Micro (manual) (46962) Specimen Handling (99000) T-Culture, Urine (95284-13244)  Problem # 2:  FATIGUE (ICD-780.79)  Orders: Venipuncture (01027) TLB-BMP (Basic Metabolic Panel-BMET) (80048-METABOL) TLB-CBC Platelet - w/Differential (85025-CBCD) TLB-Hepatic/Liver Function Pnl (80076-HEPATIC) TLB-TSH (  Thyroid Stimulating Hormone) (84443-TSH) Specimen Handling (40981)  Problem # 3:  GAIT DISTURBANCE (ICD-781.2)  R/O Parkinson's  Orders: Neurology Referral (Neuro)  Complete Medication List: 1)  Valium 5 Mg Tabs (Diazepam) .... Take 1 tablet by mouth as  needed 2)  Ambien 5 Mg Tabs (Zolpidem tartrate) .Marland Kitchen.. 1 by mouth hs as needed  Other Orders: Admin 1st Vaccine (19147) Flu Vaccine 46yrs + (82956)  Patient Instructions: 1)  Drink as much fluid as you can tolerate for the next few days.  Laboratory Results   Urine Tests    Routine Urinalysis   Color: lt. yellow Appearance: Clear Glucose: negative   (Normal Range: Negative) Bilirubin: negative   (Normal Range: Negative) Ketone: negative   (Normal Range: Negative) Spec. Gravity: 1.010   (Normal Range: 1.003-1.035) Blood: negative   (Normal Range: Negative) pH: 7.0   (Normal Range: 5.0-8.0) Protein: negative   (Normal Range: Negative) Urobilinogen: 0.2   (Normal Range: 0-1) Nitrite: negative   (Normal Range: Negative) Leukocyte Esterace: negative   (Normal Range: Negative)    Comments: Sent for culture due to symptoms   Flu Vaccine Consent Questions     Do you have a history of severe allergic reactions to this vaccine? no    Any prior history of allergic reactions to egg and/or gelatin? no    Do you have a sensitivity to the preservative Thimersol? no    Do you have a past history of Guillan-Barre Syndrome? no    Do you currently have an acute febrile illness? no    Have you ever had a severe reaction to latex? no    Vaccine information given and explained to patient? yes    Are you currently pregnant? no    Lot Number:[id number]   Exp Date:6/3 0/2012   Site Given  Left Deltoid IMyte Esterace: negative   (Normal Range: Negative)    Comments: Sent for culture due to symptoms    .lbflu

## 2010-03-08 NOTE — Assessment & Plan Note (Signed)
Summary: cpx//pt will be fasting/lch   Vital Signs:  Patient profile:   65 year old female Height:      65 inches Weight:      173.8 pounds BMI:     29.03 Temp:     98.6 degrees F oral Resp:     16 per minute BP sitting:   108 / 76  (left arm) Cuff size:   large  Vitals Entered By: Shonna Chock (July 21, 2009 11:17 AM) CC: CPX with fasting labs , Lipid Management Comments REVIEWED MED LIST, PATIENT AGREED DOSE AND INSTRUCTION CORRECT  **Height recorded with shoes on**   CC:  CPX with fasting labs  and Lipid Management.  History of Present Illness: Kristina Carr is here for a physical; she has diffuse back pain since age 89. It has been progressive over 2 years up  to  level 8 with  dramatic impact on gait & balance.Cervical & lumbar spine films  in 10/2008 revealed spondylosis with decreased ROM. Physical Therapy of no benefit.  Lipid Management History:      Positive NCEP/ATP III risk factors include female age 29 years old or older and HDL cholesterol less than 40.  Negative NCEP/ATP III risk factors include non-diabetic, no family history for ischemic heart disease, non-tobacco-user status, non-hypertensive, no ASHD (atherosclerotic heart disease), no prior stroke/TIA, no peripheral vascular disease, and no history of aortic aneurysm.     Allergies: 1)  ! Prilosec 2)  ! Effexor 3)  ! Simvastatin  Past History:  Past Medical History: ACID REFLUX DISEASE (ICD-530.81) RESTLESS LEG SYNDROME, HX OF (ICD-V12.49) DYSLIPIDEMIA (ICD-272.4); NMR 2007: LDL 148(2458/1873) ,HDL 41, TG 154. LDL goal = < 100.[place] LDL goal = < 130.  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 Eyes:  Denies blurring, double vision, and vision loss-both eyes. ENT:  Denies difficulty swallowing and hoarseness. CV:  Complains of palpitations; denies chest pain or discomfort, leg cramps with exertion, swelling of feet, and swelling of hands; Mild DOE. Resp:  Denies cough and sputum productive. GI:  Denies abdominal pain, bloody stools, dark tarry stools, and indigestion; Gyn gave her stool cards. GU:  Complains of urinary frequency; denies discharge, dysuria, and hematuria; Dr Eda Paschal Rxed med for frequency. MS:  See HPI; Complains of loss of strength, low back pain, mid back pain, muscle weakness, and thoracic pain; denies joint redness, joint swelling, and cramps. Derm:  Denies changes in nail beds, dryness, hair loss, lesion(s), and rash. Neuro:  Complains of tingling; denies brief paralysis, numbness, and weakness; Tingling L shoulder. Monor tremor RUE. Psych:  Denies anxiety, depression, easily angered, easily tearful, and irritability. Endo:  Denies cold intolerance, excessive hunger, excessive thirst, excessive urination, and heat intolerance. Heme:  Denies abnormal bruising and bleeding. Allergy:  Complains of itching eyes, seasonal allergies, and sneezing; Claritin as  needed .  Physical Exam  General:   well-nourished but uncomfortable w/o acute distress; alert,appropriate and cooperative throughout examination Head:  Normocephalic and atraumatic without obvious abnormalities. Eyes:  No corneal or conjunctival inflammation noted. Perrla. Funduscopic exam benign,  without hemorrhages, exudates or papilledema. Ears:  External ear exam shows no significant lesions or deformities.  Otoscopic examination reveals clear canals, tympanic membranes are intact bilaterally without bulging, retraction, inflammation or discharge. Hearing is grossly normal bilaterally. Nose:  External nasal examination shows no deformity or inflammation. Nasal mucosa are pink and moist without lesions or exudates. Mouth:  Oral mucosa and oropharynx without lesions or exudates.  Teeth in good repair. Neck:  No deformities, masses, or tenderness noted. Breasts:  Dr Eda Paschal Lungs:  Normal respiratory effort, chest expands symmetrically. Lungs are clear to auscultation, no crackles or wheezes. Heart:  Normal rate and regular rhythm. S1 and S2 normal without gallop, murmur, click, rub . S4 Abdomen:  Bowel sounds positive,abdomen soft and non-tender without masses, organomegaly or hernias noted. Rectal:  to complete stool cards Genitalia:  Dr Eda Paschal Msk:  No deformity or scoliosis noted of thoracic or lumbar spine but spine straightened.  Classic "crawl" down exam table Pulses:  R and L carotid,radial,dorsalis pedis and posterior tibial pulses are full and equal bilaterally Extremities:  No clubbing, cyanosis, edema, or deformity noted. Neurologic:  alert & oriented X3, strength normal in all extremities, and DTRs symmetrical and normal.   Skin:  Intact without suspicious lesions or rashes Cervical Nodes:  No lymphadenopathy noted Axillary Nodes:  No palpable lymphadenopathy Psych:  memory intact for recent and remote, normally interactive, and good eye contact.     Impression & Recommendations:  Problem # 1:  ROUTINE GENERAL MEDICAL EXAM@HEALTH  CARE FACL (ICD-V70.0)  Orders: Venipuncture (29562) TLB-Lipid Panel (80061-LIPID) TLB-BMP (Basic Metabolic Panel-BMET) (80048-METABOL) TLB-CBC Platelet - w/Differential (85025-CBCD) TLB-Hepatic/Liver Function Pnl  (80076-HEPATIC) TLB-TSH (Thyroid Stimulating Hormone) (84443-TSH) TLB-Rheumatoid Factor (RA) (13086-VH) TLB-Sedimentation Rate (ESR) (85652-ESR) EKG w/ Interpretation (93000) TLB-Magnesium (Mg) (83735-MG)  Problem # 2:  NECK PAIN, CHRONIC (ICD-723.1)  Her updated medication list for this problem includes:    Adult Aspirin Ec Low Strength 81 Mg Tbec (Aspirin) .Marland Kitchen... Take 1 tablet by mouth daily as directed    Propoxyphene N-apap 100-650 Mg Tabs (Propoxyphene n-apap) .Marland Kitchen... 1 q 6 hrs as needed  Orders: Venipuncture (84696) TLB-Rheumatoid Factor (RA) (29528-UX) TLB-Sedimentation Rate (ESR) (85652-ESR) Orthopedic Referral (Ortho)  Problem # 3:  LOW BACK PAIN, CHRONIC (ICD-724.2)  Her updated medication list for this problem includes:    Adult Aspirin Ec Low Strength 81 Mg Tbec (Aspirin) .Marland Kitchen... Take 1 tablet by mouth daily as directed    Propoxyphene N-apap 100-650 Mg Tabs (Propoxyphene n-apap) .Marland Kitchen... 1 q 6 hrs as needed  Orders: Venipuncture (32440) TLB-Rheumatoid Factor (RA) (10272-ZD) TLB-Sedimentation Rate (ESR) (85652-ESR) Orthopedic Referral (Ortho)  Problem # 4:  PALPITATIONS (ICD-785.1)  Orders: Venipuncture (66440) TLB-BMP (Basic Metabolic Panel-BMET) (80048-METABOL) TLB-TSH (Thyroid Stimulating Hormone) (84443-TSH) TLB-Magnesium (Mg) (83735-MG)  Problem # 5:  DYSLIPIDEMIA (ICD-272.4)  Orders: Venipuncture (34742) TLB-Lipid Panel (80061-LIPID)  Complete Medication List: 1)  Adult Aspirin Ec Low Strength 81 Mg Tbec (Aspirin) .... Take 1 tablet by mouth daily as directed 2)  Multivitamins Tabs (Multiple vitamin) .... Take 1 tablet by mouth daily 3)  Valium 5 Mg Tabs (Diazepam) .... Take 1 tablet by mouth as needed 4)  Ambien 5 Mg Tabs (Zolpidem tartrate) .Marland Kitchen.. 1 by mouth hs as needed 5)  Citalopram Hydrobromide 20 Mg Tabs (Citalopram  hydrobromide) .Marland Kitchen.. 1 once daily 6)  Propoxyphene N-apap 100-650 Mg Tabs (Propoxyphene n-apap) .Marland Kitchen.. 1 q 6 hrs as needed  Lipid  Assessment/Plan:      Based on NCEP/ATP III, the patient's risk factor category is "2 or more risk factors and a calculated 10 year CAD risk of < 20%".  The patient's lipid goals are as follows: Total cholesterol goal is 200; LDL cholesterol goal is 100; HDL cholesterol goal is 50; Triglyceride goal is 150.  Her LDL cholesterol goal has not been met.  Secondary causes for hyperlipidemia have been ruled out.  She has been counseled on adjunctive measures for lowering her cholesterol and has been provided with dietary instructions.    Patient Instructions: 1)  Assess response to Cymbalta  samples.Avoid stimulants as discussed because of palpitations.Complete stool cards.

## 2010-03-08 NOTE — Assessment & Plan Note (Signed)
Summary: eval parkinson/cbs   Vital Signs:  Patient profile:   65 year old female Height:      65 inches (165.10 cm) Weight:      170.38 pounds (77.45 kg) BMI:     28.46 Temp:     98.7 degrees F (37.06 degrees C) oral Resp:     15 per minute BP sitting:   120 / 70  (left arm) Cuff size:   regular  Vitals Entered By: Lucious Groves CMA (September 23, 2009 1:43 PM) CC: Parkinsons eval./kb Is Patient Diabetic? No Pain Assessment Patient in pain? no      Comments Patient states that she is not taking Citalopram or Propoxyphene. Lucious Groves CMA  September 23, 2009 1:44 PM    CC:  Parkinsons eval./kb.  History of Present Illness: Dr Ethelene Hal had referred her to PT; the Physical Therapist  questioned Parkinson's based on gait & posture. She has noted "micrographia", some anosmia,  loss of balance,  & decreased arm swings.No FH of Parinson's Disease.Med list reviewed ; no agent which might cause pseudo Parkinson's present.  Current Medications (verified): 1)  Adult Aspirin Ec Low Strength 81 Mg  Tbec (Aspirin) .... Take 1 Tablet By Mouth Daily As Directed 2)  Multivitamins   Tabs (Multiple Vitamin) .... Take 1 Tablet By Mouth Daily 3)  Valium 5 Mg  Tabs (Diazepam) .... Take 1 Tablet By Mouth As Needed 4)  Ambien 5 Mg  Tabs (Zolpidem Tartrate) .Marland Kitchen.. 1 By Mouth Hs As Needed 5)  Citalopram Hydrobromide 20 Mg Tabs (Citalopram Hydrobromide) .Marland Kitchen.. 1 Once Daily 6)  Propoxyphene N-Apap 100-650 Mg Tabs (Propoxyphene N-Apap) .Marland Kitchen.. 1 Q 6 Hrs As Needed  Allergies (verified): 1)  ! Prilosec 2)  ! Effexor 3)  ! Simvastatin  Review of Systems GU:  Complains of incontinence. Neuro:  Complains of difficulty with concentration, disturbances in coordination, memory loss, numbness, poor balance, and tingling; denies brief paralysis; N&T L shoulder.  Physical Exam  General:  well-nourished,in no acute distress; alert,appropriate and cooperative throughout examination, no definite mask facies Eyes:  No corneal  or conjunctival inflammation noted. EOMI. Perrla. Field of  Vision grossly normal. Extremities:  No clubbing, cyanosis, edema.No cogwheeling  Neurologic:  alert & oriented X3, cranial nerves II-XII intact, strength normal in all extremities, sensation intact to light touch over face  gait with reduced arm swing & choppy steps with turns , and DTRs symmetrical and 1&1/2. RAM WNL but brisk tremor of hands when stopped   Impression & Recommendations:  Problem # 1:  GAIT DISTURBANCE (ICD-781.2) R/O Parkinson's  Orders: Neurology Referral (Neuro)  Complete Medication List: 1)  Adult Aspirin Ec Low Strength 81 Mg Tbec (Aspirin) .... Take 1 tablet by mouth daily as directed 2)  Multivitamins Tabs (Multiple vitamin) .... Take 1 tablet by mouth daily 3)  Valium 5 Mg Tabs (Diazepam) .... Take 1 tablet by mouth as needed 4)  Ambien 5 Mg Tabs (Zolpidem tartrate) .Marland Kitchen.. 1 by mouth hs as needed 5)  Citalopram Hydrobromide 20 Mg Tabs (Citalopram hydrobromide) .Marland Kitchen.. 1 once daily 6)  Propoxyphene N-apap 100-650 Mg Tabs (Propoxyphene n-apap) .Marland Kitchen.. 1 q 6 hrs as needed  Patient Instructions: 1)  Continue Physical Therapy until Neurology  consultation.

## 2010-03-08 NOTE — Letter (Signed)
Summary: Primary Care Consult Scheduled Letter  [facility]  [address] [place], [place] [address]   Phone: [phone]  Fax: [phone]      10/04/2009 MRN: [id number]  [facility] [address] [place], [place]  [address]    Dear Ms. Pond,    We have scheduled an appointment for you.  At the recommendation of Dr. Marga Melnick, we have scheduled you a consult with Dr. Lesia Sago of [facility] on 10-29-2009 at 2:00pm.  Their address is [address], [place] [address]. The office phone number is [phone].  If this appointment day and time is not convenient for you, please feel free to call the office of the doctor you are being referred to at the number listed above and reschedule the appointment.    It is important for you to keep your scheduled appointments. We are here to make sure you are given good patient care.   Thank you,    Renee, Patient Care Coordinator [facility] [facility]

## 2010-03-10 DIAGNOSIS — K449 Diaphragmatic hernia without obstruction or gangrene: Secondary | ICD-10-CM | POA: Insufficient documentation

## 2010-03-10 NOTE — Assessment & Plan Note (Signed)
Summary: DISCUSS PARKINSONS AND NAUSEA/KB   Vital Signs:  Patient profile:   65 year old female Weight:      161.8 pounds BMI:     27.02 Temp:     98.4 degrees F oral Pulse rate:   92 / minute Resp:     16 per minute BP sitting:   122 / 80  (left arm) Cuff size:   large  Vitals Entered By: Shonna Chock CMA (January 28, 2010 7:59 AM) CC: Overall not feeling well x several months , Heartburn   CC:  Overall not feeling well x several months  and Heartburn.  History of Present Illness:      This is a 65 year old woman who presents with  intermittent nausea for months.This preceded starting  the Requip. No other definite triggers present.  The patient reports acid reflux, sour taste in mouth, occasioanl  epigastric pain, and trouble swallowing pills occasionally, but denies weight loss and weight gain.  The patient denies the following alarm features: melena, dysphagia, hematemesis, and vomiting. She has had occasional "pink" on tissue post BMs. FOB done @ Dr Verl Dicker office. Prior evaluation has included EGD which was negative.  The patient has found the following treatments to be partially effective: an H2 blocker.  Single minimal LFT elevated in 6/11 resolved on F/U.  Current Medications (verified): 1)  Valium 5 Mg  Tabs (Diazepam) .... Take 1 Tablet By Mouth As Needed 2)  Ambien 5 Mg  Tabs (Zolpidem Tartrate) .Marland Kitchen.. 1 By Mouth Hs As Needed 3)  Requip-? Dose .Marland Kitchen.. 1 By Mouth Once Daily Then Increase To 2 By Mouth Once Daily  Allergies: 1)  ! Prilosec 2)  ! Effexor 3)  ! Simvastatin  Physical Exam  General:  in no acute distress; alert,appropriate and cooperative throughout examination Eyes:  No corneal or conjunctival inflammation noted.No icterus Mouth:  No pharyngeal erythema.   Lungs:  Normal respiratory effort, chest expands symmetrically. Lungs are clear to auscultation, no crackles or wheezes. Heart:  Normal rate and regular rhythm. S1 and S2 normal without gallop, murmur,  click, rub or other extra sounds. Abdomen:  Bowel sounds positive,abdomen soft and non-tender without masses, organomegaly or hernias noted. Skin:  Intact without suspicious lesions or rashes. No juaundice Cervical Nodes:  No lymphadenopathy noted Axillary Nodes:  No palpable lymphadenopathy Psych:  flat affect and subdued.     Impression & Recommendations:  Problem # 1:  ESOPHAGEAL REFLUX (ICD-530.81)   Nausea; some epigastric pain The following medications were removed from the medication list:    Hyoscyamine Sulfate 0.125 Mg Subl (Hyoscyamine sulfate) .Marland Kitchen... 1 every 6 hrs as needed for bloating Her updated medication list for this problem includes:    Ranitidine Hcl 150 Mg Tabs (Ranitidine hcl) .Marland Kitchen... 1 two times a day pre meals  Orders: Venipuncture (04540) TLB-Amylase (82150-AMYL) TLB-Lipase (83690-LIPASE) TLB-H. Pylori Abs(Helicobacter Pylori) (86677-HELICO)  Problem # 2:  EPIGASTRIC DISCOMFORT (ICD-789.06)  Orders: Venipuncture (98119) TLB-Amylase (82150-AMYL) TLB-Lipase (83690-LIPASE) TLB-H. Pylori Abs(Helicobacter Pylori) (86677-HELICO)  Complete Medication List: 1)  Valium 5 Mg Tabs (Diazepam) .... Take 1 tablet by mouth as needed 2)  Ambien 5 Mg Tabs (Zolpidem tartrate) .Marland Kitchen.. 1 by mouth hs as needed 3)  Requip-? Dose  .Marland KitchenMarland Kitchen. 1 by mouth once daily then increase to 2 by mouth once daily 4)  Ranitidine Hcl 150 Mg Tabs (Ranitidine hcl) .Marland Kitchen.. 1 two times a day pre meals  Patient Instructions: 1)  Verify stool card results.Gi referral if no better  with ranitidine . 2)  Avoid foods high in acid (tomatoes, citrus juices, spicy foods). Avoid eating within two hours of lying down or before exercising. Do not over eat; try smaller more frequent meals. Elevate head of bed twelve inches when sleeping. Prescriptions: RANITIDINE HCL 150 MG TABS (RANITIDINE HCL) 1 two times a day pre meals  #60 x 2   Entered and Authorized by:   Marga Melnick MD   Signed by:   Marga Melnick MD on  01/28/2010   Method used:   Print then Give to Patient   RxID:   [id number]    Orders Added: 1)  Est. Patient Level III [72536] 2)  Venipuncture [64403] 3)  TLB-Amylase [82150-AMYL] 4)  TLB-Lipase [83690-LIPASE] 5)  TLB-H. Pylori Abs(Helicobacter Pylori) [86677-HELICO]  Appended Document: DISCUSS PARKINSONS AND NAUSEA/KB

## 2010-03-10 NOTE — Consult Note (Signed)
Summary: [facility]  [facility]   Imported By: Lanelle Bal 02/02/2010 10:31:07  _____________________________________________________________________  External Attachment:    Type:   Image     Comment:   External Document

## 2010-03-10 NOTE — Consult Note (Signed)
Summary: [facility]  [facility]   Imported By: Lanelle Bal 02/02/2010 11:09:10  _____________________________________________________________________  External Attachment:    Type:   Image     Comment:   External Document

## 2010-03-10 NOTE — Letter (Signed)
Summary: [facility]  [facility]   Imported By: Lanelle Bal 02/14/2010 09:43:12  _____________________________________________________________________  External Attachment:    Type:   Image     Comment:   External Document

## 2010-03-11 ENCOUNTER — Ambulatory Visit (INDEPENDENT_AMBULATORY_CARE_PROVIDER_SITE_OTHER): Payer: 59 | Admitting: Gastroenterology

## 2010-03-11 ENCOUNTER — Other Ambulatory Visit: Payer: Self-pay | Admitting: Gastroenterology

## 2010-03-11 ENCOUNTER — Encounter (INDEPENDENT_AMBULATORY_CARE_PROVIDER_SITE_OTHER): Payer: Self-pay | Admitting: *Deleted

## 2010-03-11 ENCOUNTER — Other Ambulatory Visit: Payer: 59

## 2010-03-11 ENCOUNTER — Encounter: Payer: Self-pay | Admitting: Gastroenterology

## 2010-03-11 DIAGNOSIS — R141 Gas pain: Secondary | ICD-10-CM

## 2010-03-11 DIAGNOSIS — J328 Other chronic sinusitis: Secondary | ICD-10-CM | POA: Insufficient documentation

## 2010-03-11 DIAGNOSIS — R1084 Generalized abdominal pain: Secondary | ICD-10-CM | POA: Insufficient documentation

## 2010-03-11 DIAGNOSIS — G20 Parkinson's disease: Secondary | ICD-10-CM | POA: Insufficient documentation

## 2010-03-11 DIAGNOSIS — R142 Eructation: Secondary | ICD-10-CM

## 2010-03-11 DIAGNOSIS — G20A1 Parkinson's disease without dyskinesia, without mention of fluctuations: Secondary | ICD-10-CM | POA: Insufficient documentation

## 2010-03-11 DIAGNOSIS — K219 Gastro-esophageal reflux disease without esophagitis: Secondary | ICD-10-CM

## 2010-03-11 DIAGNOSIS — Z800 Family history of malignant neoplasm of digestive organs: Secondary | ICD-10-CM

## 2010-03-11 DIAGNOSIS — R143 Flatulence: Secondary | ICD-10-CM

## 2010-03-11 LAB — SEDIMENTATION RATE: Sed Rate: 12 mm/hr (ref 0–22)

## 2010-03-16 NOTE — Procedures (Signed)
Summary: Colonoscopy   Colonoscopy  Procedure date:  04/04/2004  Findings:      Location:  [facility].   Patient Name: Kristina Carr, Kristina Carr MRN:  Procedure Procedures: Colonoscopy CPT: [phone].  Personnel: Endoscopist: Vania Rea. Jarold Motto, MD.  Referred By: Titus Dubin. Alwyn Ren, MD.  Exam Location: Exam performed in Outpatient Clinic. Outpatient  Patient Consent: Procedure, Alternatives, Risks and Benefits discussed, consent obtained, from patient. Consent was obtained by the RN.  Indications Symptoms: Constipation Diarrhea Abdominal pain / bloating.  History  Current Medications: Patient is not currently taking Coumadin.  Pre-Exam Physical: Performed Apr 04, 2004. Cardio-pulmonary exam, Rectal exam, Abdominal exam, Extremity exam, Mental status exam WNL.  Exam Exam: Extent of exam reached: Cecum, extent intended: Cecum.  The cecum was identified by appendiceal orifice and IC valve. Patient position: left side to back. Duration of exam: 25 minutes. Colon retroflexion performed. Images taken. ASA Classification: I. Tolerance: excellent.  Monitoring: Pulse and BP monitoring, Oximetry used. Supplemental O2 given. at 2 Liters.  Colon Prep Used Golytely for colon prep. Prep results: excellent.  Sedation Meds: Patient assessed and found to be appropriate for moderate (conscious) sedation. Fentanyl 100 mcg. given IV. Versed 9 mg. given IV.  Instrument(s): CF 140L. Serial [id number].  Findings - NORMAL EXAM: Cecum to Rectum. Not Seen: Polyps. AVM's. Colitis. Tumors. Melanosis. Crohn's. Diverticulosis. Hemorrhoids.    Comments: Could not see the cecal tip area,....Marland Kitchenvery redundant and tortuous colon noted. Assessment Normal examination.  Comments: Chronic IBS. Events  Unplanned Interventions: No intervention was required.  Plans Medication Plan: Continue current medications.  Disposition: After procedure patient sent to recovery. After recovery  patient sent home.  Scheduling/Referral: EGD, to [facility]. Jarold Motto, MD, on Apr 04, 2004.    CC: Titus Dubin. Alwyn Ren, MD  This report was created from the original endoscopy report, which was reviewed and signed by the above listed endoscopist.

## 2010-03-16 NOTE — Progress Notes (Signed)
Summary: referral  Phone Note Call from Patient   Summary of Call: Pt called stating that despite Ranitidine two times a day it does not resolve symptoms. Per dr Alfonse Flavors advisement will refer to GI for reassessment. Pt aware referral put in awaiting appt info.............Marland KitchenFelecia Deloach CMA  March 08, 2010 3:58 PM

## 2010-03-16 NOTE — Procedures (Signed)
Summary: Endoscopy   EGD  Procedure date:  04/04/2004  Findings:      Location: [facility]    EGD  Procedure date:  04/04/2004  Findings:      Location: [facility]   Patient Name: Kristina Carr, [place] MRN:  Procedure Procedures: Panendoscopy (EGD) CPT: 43235.    with biopsy(s)/brushing(s). CPT: [id number].  Personnel: Endoscopist: Vania Rea. Jarold Motto, MD.  Referred By: Titus Dubin. Alwyn Ren, MD.  Exam Location: Exam performed in Outpatient Clinic. Outpatient  Patient Consent: Procedure, Alternatives, Risks and Benefits discussed, consent obtained, from patient. Consent was obtained by the RN.  Indications Symptoms: Diarrhea. Reflux symptoms  History  Current Medications: Patient is not currently taking Coumadin.  Pre-Exam Physical: Performed Apr 04, 2004  Cardio-pulmonary exam, Abdominal exam, Extremity exam, Mental status exam WNL.  Exam Exam Info: Maximum depth of insertion Duodenum, intended Duodenum. Patient position: on left side. Duration of exam: 15 minutes. Vocal cords visualized. Gastric retroflexion performed. Images taken. ASA Classification: I. Tolerance: excellent.  Sedation Meds: Patient assessed and found to be appropriate for moderate (conscious) sedation. Cetacaine Spray 1 sprays given aerosolized. Versed 1 mg. given IV.  Monitoring: BP and pulse monitoring done. Oximetry used. Supplemental O2 given at 2 Liters.  Instrument(s): GIF 140. Serial [id number].   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 ICD9: Hernia, Hiatal: 553.3. - OTHER FINDING: in Distal Esophagus. Biopsy/Other Finding taken. Comments: Irregular Z-line biopsied.Marland KitchenMarland KitchenR/O Barrett's mucosa.  - Normal: Body to Duodenal 2nd Portion. Not Seen: Tumor. Mucosal abnormality. AVM's. Foreign body. Biopsy/Normal taken. Comments: R/O celiac disease...  - DIAGNOSTIC TEST: from Body. RUT done, results pending   Assessment  Diagnoses: 553.3: Hernia,  Hiatal. GERD.   Comments: Small bowel Bx. done. Events  Unplanned Intervention: No unplanned interventions were required.  Plans Medication(s): Continue current medications.  Patient Education: Patient given standard instructions for: Reflux.  Disposition: After procedure patient sent to recovery. After recovery patient sent home.  Scheduling: Follow-up prn.    CC: Titus Dubin. Alwyn Ren, MD  This report was created from the original endoscopy report, which was reviewed and signed by the above listed endoscopist.

## 2010-03-16 NOTE — Assessment & Plan Note (Addendum)
Summary: epigastric disconfort sch w renee [facility] copay and cx fee ad...   Vital Signs:  Patient profile:   65 year old female Height:      65 inches Weight:      162 pounds BMI:     27.06 Pulse rate:   84 / minute Pulse rhythm:   regular BP sitting:   98 / 68  (left arm)  Vitals Entered By: Milford Cage NCMA (March 11, 2010 9:20 AM)  History of Present Illness Visit Type: Initial Consult Primary GI MD: Sheryn Bison MD FACP FAGA Primary Provider: Marga Melnick, MD Requesting Provider: Marga Melnick, MD Chief Complaint: Epigastric discomfort after eating primarily off and on x 1 year diagnosed witih Parkinson's Disease and bladder Ca in December History of Present Illness:   65 year old Caucasian female who has had urologic surgery in December for bladder cancer. Also at that time she was diagnosed with Parkinson's disease and is under the care of Dr. Lesia Sago and takes Requip 2 mg b.i.d. She is doing well with her neurological and urologic problems.  She complains of chronic fatigue, vague diffuse discomfort, and severe constipation with lower bowel no gas, bloating, but no rectal bleeding or melena. She also denies specific upper GI complaints except for rather typical GERD with some associated throat clearing and acid reflux at night. Has been mild early satiety, anorexia, and a 10 pound weight loss. In the past she has been treated for GERD with Prilosec and add urticaria, and is now on ranitidine 150 mg twice a day with mild improvement in her upper GI complaints. There is no known history of gallbladder or liver disease, pancreatitis or hepatitis. Last endoscopy and colonoscopy were 5-6 years ago.  She denies any specific food intolerances but does have excessive gas and bloating. 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 Aleve. Family history is remarkable for a mother with colon cancer age 31 and a sister with polyps. She has had previous lumbar  laminectomies and appendectomy. She denies abuse of alcohol or cigarettes. Other problems include restless leg syndrome and hyperlipidemia.   GI Review of Systems    Reports abdominal pain, acid reflux, belching, and  bloating.     Location of  Abdominal pain: upper abdomen.    Denies chest pain, dysphagia with liquids, dysphagia with solids, heartburn, loss of appetite, nausea, vomiting, vomiting blood, weight loss, and  weight gain.      Reports change in bowel habits, constipation, and  diarrhea.     Denies anal fissure, black tarry stools, diverticulosis, fecal incontinence, heme positive stool, hemorrhoids, irritable bowel syndrome, jaundice, light color stool, liver problems, rectal bleeding, and  rectal pain.  Current Medications (verified): 1)  Valium 5 Mg  Tabs (Diazepam) .... Take 1 Tablet By Mouth As Needed 2)  Ambien 5 Mg  Tabs (Zolpidem Tartrate) .Marland Kitchen.. 1 By Mouth Hs As Needed 3)  Requip 2 Mg Tabs (Ropinirole Hcl) .Marland Kitchen.. 1 Po  Two Times A Day 4)  Ranitidine Hcl 150 Mg Tabs (Ranitidine Hcl) .Marland Kitchen.. 1 Two Times A Day Pre Meals 5)  Vesicare 5 Mg Tabs (Solifenacin Succinate) .Marland Kitchen.. 1 By Mouth Once Daily 6)  Aspirin 81 Mg Tbec (Aspirin) .Marland Kitchen.. 1 By Mouth Once Daily  Allergies (verified): 1)  ! Prilosec 2)  ! Effexor 3)  ! Simvastatin  Past History:  Past medical, surgical, family and social histories (including risk factors) reviewed for relevance to current acute and chronic problems.  Past Medical History: ACID REFLUX DISEASE (ICD-530.81) RESTLESS LEG SYNDROME, HX OF (ICD-V12.49) DYSLIPIDEMIA (ICD-272.4); NMR 2007: LDL 148(2458/1873) ,HDL 41, TG 154. LDL goal = < 100.[place] LDL goal = < 130.  Current Problems:  HIATAL HERNIA WITH REFLUX (ICD-553.3) EPIGASTRIC DISCOMFORT (ICD-789.06) ESOPHAGEAL REFLUX (ICD-530.81) LESION, BLADDER (ICD-596.9)  Bladder Cancer ABDOMINAL BLOATING (ICD-787.3) IRRITABLE BOWEL SYNDROME (ICD-564.1) ARTHRALGIA (ICD-719.40) HYPERGLYCEMIA, FASTING  (ICD-790.29) GAIT DISTURBANCE (ICD-781.2) NECK PAIN, CHRONIC (ICD-723.1) LOW BACK PAIN, CHRONIC (ICD-724.2) NONSPEC ELEVATION OF LEVELS OF TRANSAMINASE/LDH (ICD-790.4) FATIGUE (ICD-780.79) ANXIETY STATE, UNSPECIFIED (ICD-300.00) PALPITATIONS (ICD-785.1) CHEST PAIN, ATYPICAL (ICD-786.59) Hx of ACID REFLUX DISEASE (ICD-530.81) RESTLESS LEG SYNDROME, HX OF (ICD-V12.49) DYSLIPIDEMIA (ICD-272.4)] Parkinson's Disease  Past Surgical History: Appendectomy Lumbar laminectomy x 3, Dr Phillipd & Dr Roxan Hockey Right breast surgery- benign lesion Left foot surgery for bunion 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 Father: Cardiac arrthymias, lung cancer Mother: Colon cancer, breast cancer, Gyn  cancer, HTN Siblings: sister: palpitations MGM:  stomach cancer Maternal aunt:  Breast cancer  (died 56) PGF:  Prostate cancer PGGF:  Prostate cancer  Social History: Reviewed history from 07/21/2009 and no changes required. Never Smoked Alcohol use-no Diet none Occupation: Volunteer @ [employer] Regular exercise-no due to back issues Married 1 boy  Review of Systems       The patient complains of anxiety-new, back pain, fatigue, urination - excessive, and voice change.  The patient denies allergy/sinus, anemia, arthritis/joint pain, blood in urine, breast changes/lumps, change in vision, confusion, cough, coughing up blood, depression-new, fainting, fever, headaches-new, hearing problems, heart murmur, heart rhythm changes, itching, menstrual pain, muscle pains/cramps, night sweats, nosebleeds, pregnancy symptoms, shortness of breath, skin rash, sleeping problems, sore throat, swelling of feet/legs, swollen lymph glands, thirst - excessive , urination - excessive , urination  changes/pain, urine leakage, and vision changes.   ENT:  Complains of hoarseness; denies earache, ear discharge, tinnitus, decreased hearing, nasal congestion, loss of smell, nosebleeds, sore throat, and difficulty swallowing. GI:  Complains of indigestion/heartburn, abdominal pain, gas/bloating, and constipation; denies difficulty swallowing, pain on swallowing, nausea, vomiting, vomiting blood, jaundice, diarrhea, change in bowel habits, bloody BM's, black BMs, and fecal incontinence. GU:  Complains of urinary frequency; denies urinary burning, blood in urine, nocturnal urination, abnormal vaginal bleeding, amenorrhea, menorrhagia, vaginal discharge, pelvic pain, genital sores, painful intercourse, and decreased libido. Neuro:  Complains of abnormal sensation and restless legs; denies weakness, paralysis, seizures, syncope, tremors, vertigo, transient blindness, frequent falls, frequent headaches, difficulty walking, headache, sciatica, radiculopathy other:, memory loss, and confusion; some movement disorder better on Requip medication. She also takes VESIcare for spastic bladder, daily aspirin, and p.r.n. Ambien and Valium. Psych:  Complains of depression, anxiety, and memory loss; denies suicidal ideation, hallucinations, paranoia, phobia, and confusion. Heme:  Denies bruising, bleeding, enlarged lymph nodes, and pagophagia.  Physical Exam  General:  Well developed, well nourished, no acute distress.healthy appearing.   Head:  Normocephalic and atraumatic. Eyes:  PERRLA, no icterus.exam deferred to patient's ophthalmologist.   Neck:  Supple; no masses or thyromegaly. Lungs:  Clear throughout to auscultation. Heart:  Regular rate and rhythm; no murmurs, rubs,  or bruits. Abdomen:  Soft, nontender and nondistended. No masses, hepatosplenomegaly or hernias noted. Normal bowel sounds. Rectal:  Normal exam.hemocult negative.   Msk:  Symmetrical with no gross deformities. Normal  posture.  Extremities:  No clubbing, cyanosis, edema or deformities noted. Neurologic:  Alert and  oriented x4;  grossly normal neurologically. Skin:  Intact without significant lesions or rashes. Cervical Nodes:  No significant cervical adenopathy. Psych:  Alert and cooperative. Normal mood and affect.depressed affect and anxious.     Impression & Recommendations:  Problem # 1:  ABDOMINAL PAIN, GENERALIZED (ICD-789.07) Assessment Unchanged Probable constipation predominant IBS exacerbated by recent medical illnesses, anxiety and depression. I have given her anti-spasmodic to use on a p.r.n. basis and have scheduled endoscopy and colonoscopy. Also recommended regular MiraLax at bedtime. Orders: Colon/Endo (Colon/Endo) TLB-Sedimentation Rate (ESR) (85652-ESR) TLB-CRP-High Sensitivity (C-Reactive Protein) (86140-FCRP)  Problem # 2:  ESOPHAGEAL REFLUX (ICD-530.81) Assessment: Deteriorated Previous reaction to PPI medications. She is to continue ranitidine 150 mg twice a day with standard antireflux maneuvers. At the time of endoscopy we will do small bowel biopsy to exclude celiac disease and also examination for H. pylori. Orders: Colon/Endo (Colon/Endo) TLB-Sedimentation Rate (ESR) (85652-ESR) TLB-CRP-High Sensitivity (C-Reactive Protein) (86140-FCRP)  Problem # 3:  IRRITABLE BOWEL SYNDROME (ICD-564.1) Assessment: Deteriorated  Problem # 4:  FATIGUE (ICD-780.79) Assessment: Deteriorated probably related to anxiety and depression.  Problem # 5:  PARKINSON'S DISEASE (ICD-332.0) Assessment: Improved continue followup with Dr. Lesia Sago as planned.  Patient Instructions: 1)  You have been scheduled for an endoscopy and colonoscopy. Please follow written instructions that were given to you at your office visit today.  2)  Please pick up your prescription for Moviprep at the pharmacy. An electronic presription has already been sent. 3)  Your physician requests that you go to the  basement floor of our office to have the following labwork completed before leaving today: CRP, Sedmimentation Rate 4)  Copy sent to : Dr Marga Melnick and Dr. Lesia Sago in neurology 5)  The medication list was reviewed and reconciled.  All changed / newly prescribed medications were explained.  A complete medication list was provided to the patient / caregiver. 6)  Constipation and Hemorrhoids brochure given.  Prescriptions: MOVIPREP 100 GM  SOLR (PEG-KCL-NACL-NASULF-NA ASC-C) As per prep instructions.  #1 x 0   Entered by:   Lamona Curl CMA (AAMA)   Authorized by:   Mardella Layman MD [facility]   Signed by:   Lamona Curl CMA (AAMA) on 03/11/2010   Method used:   Electronically to        [facility] [phone]* (retail)       [address]       [place], [place]  [address]       Ph: [phone]       Fax:    RxID:   [id number]    Orders Added: 1)  Colon/Endo [Colon/Endo] 2)  TLB-Sedimentation Rate (ESR) [85652-ESR] 3)  TLB-CRP-High Sensitivity (C-Reactive Protein) [86140-FCRP]

## 2010-03-16 NOTE — Letter (Signed)
Summary: [facility] Instructions  [facility]  [address] [place], [place] [address]   Phone: [phone]  Fax: [phone]       Kristina Carr    August 25, 1945    MRN: [id number]        Procedure Day /Date: Monday 03/21/10     Arrival Time: 2:00 pm     Procedure Time: 3:00 pm     Location of Procedure:                    _ x_  [facility] (4th Floor)  PREPARATION FOR COLONOSCOPY WITH MOVIPREP   Starting 5 days prior to your procedure 03/17/10 do not eat nuts, seeds, popcorn, corn, beans, peas,  salads, or any raw vegetables.  Do not take any fiber supplements (e.g. Metamucil, Citrucel, and Benefiber).  THE DAY BEFORE YOUR PROCEDURE         DATE: 03/20/10  DAY: Sunday  1.  Drink clear liquids the entire day-NO SOLID FOOD  2.  Do not drink anything colored red or purple.  Avoid juices with pulp.  No orange juice.  3.  Drink at least 64 oz. (8 glasses) of fluid/clear liquids during the day to prevent dehydration and help the prep work efficiently.  CLEAR LIQUIDS INCLUDE: Water Jello Ice Popsicles Tea (sugar ok, no milk/cream) Powdered fruit flavored drinks Coffee (sugar ok, no milk/cream) Gatorade Juice: apple, white grape, white cranberry  Lemonade Clear bullion, consomm, broth Carbonated beverages (any kind) Strained chicken noodle soup Hard Candy                             4.  In the morning, mix first dose of MoviPrep solution:    Empty 1 Pouch A and 1 Pouch B into the disposable container    Add lukewarm drinking water to the top line of the container. Mix to dissolve    Refrigerate (mixed solution should be used within 24 hrs)  5.  Begin drinking the prep at 5:00 p.m. The MoviPrep container is divided by 4 marks.   Every 15 minutes drink the solution down to the next mark (approximately 8 oz) until the full liter is complete.   6.  Follow completed prep with 16 oz of clear liquid of your choice (Nothing red or purple).  Continue  to drink clear liquids until bedtime.  7.  Before going to bed, mix second dose of MoviPrep solution:    Empty 1 Pouch A and 1 Pouch B into the disposable container    Add lukewarm drinking water to the top line of the container. Mix to dissolve    Refrigerate  THE DAY OF YOUR PROCEDURE      DATE: 03/21/10 DAY: Monday  Beginning at 10:00 a.m. (5 hours before procedure):         1. Every 15 minutes, drink the solution down to the next mark (approx 8 oz) until the full liter is complete.  2. Follow completed prep with 16 oz. of clear liquid of your choice.    3. You may drink clear liquids until 1:00 pm (2 HOURS BEFORE PROCEDURE).   MEDICATION INSTRUCTIONS  Unless otherwise instructed, you should take regular prescription medications with a small sip of water   as early as possible the morning of your procedure.        OTHER INSTRUCTIONS  You will need a responsible adult at least 63 years  of age to accompany you and drive you home.   This person must remain in the waiting room during your procedure.  Wear loose fitting clothing that is easily removed.  Leave jewelry and other valuables at home.  However, you may wish to bring a book to read or  an iPod/MP3 player to listen to music as you wait for your procedure to start.  Remove all body piercing jewelry and leave at home.  Total time from sign-in until discharge is approximately 2-3 hours.  You should go home directly after your procedure and rest.  You can resume normal activities the  day after your procedure.  The day of your procedure you should not:   Drive   Make legal decisions   Operate machinery   Drink alcohol   Return to work  You will receive specific instructions about eating, activities and medications before you leave.    The above instructions have been reviewed and explained to me by   _______________________    I fully understand and can verbalize these instructions  _____________________________ Date _________

## 2010-03-18 ENCOUNTER — Telehealth: Payer: Self-pay | Admitting: Gastroenterology

## 2010-03-21 ENCOUNTER — Encounter: Payer: Self-pay | Admitting: Gastroenterology

## 2010-03-21 ENCOUNTER — Other Ambulatory Visit: Payer: Self-pay | Admitting: Gastroenterology

## 2010-03-21 ENCOUNTER — Encounter (AMBULATORY_SURGERY_CENTER): Payer: 59 | Admitting: Gastroenterology

## 2010-03-21 DIAGNOSIS — Z1211 Encounter for screening for malignant neoplasm of colon: Secondary | ICD-10-CM

## 2010-03-21 DIAGNOSIS — K5730 Diverticulosis of large intestine without perforation or abscess without bleeding: Secondary | ICD-10-CM

## 2010-03-21 DIAGNOSIS — K2980 Duodenitis without bleeding: Secondary | ICD-10-CM

## 2010-03-21 DIAGNOSIS — K2940 Chronic atrophic gastritis without bleeding: Secondary | ICD-10-CM

## 2010-03-21 DIAGNOSIS — K2990 Gastroduodenitis, unspecified, without bleeding: Secondary | ICD-10-CM

## 2010-03-21 DIAGNOSIS — K2900 Acute gastritis without bleeding: Secondary | ICD-10-CM | POA: Insufficient documentation

## 2010-03-21 DIAGNOSIS — R109 Unspecified abdominal pain: Secondary | ICD-10-CM

## 2010-03-22 LAB — HELICOBACTER PYLORI SCREEN-BIOPSY: UREASE: NEGATIVE

## 2010-03-23 ENCOUNTER — Ambulatory Visit: Payer: 59 | Attending: Neurology | Admitting: Physical Therapy

## 2010-03-23 ENCOUNTER — Encounter: Payer: 59 | Admitting: Gastroenterology

## 2010-03-23 ENCOUNTER — Encounter (INDEPENDENT_AMBULATORY_CARE_PROVIDER_SITE_OTHER): Payer: Self-pay | Admitting: *Deleted

## 2010-03-23 DIAGNOSIS — R269 Unspecified abnormalities of gait and mobility: Secondary | ICD-10-CM | POA: Insufficient documentation

## 2010-03-23 DIAGNOSIS — IMO0001 Reserved for inherently not codable concepts without codable children: Secondary | ICD-10-CM | POA: Insufficient documentation

## 2010-03-23 DIAGNOSIS — G20 Parkinson's disease: Secondary | ICD-10-CM | POA: Insufficient documentation

## 2010-03-23 DIAGNOSIS — G20A1 Parkinson's disease without dyskinesia, without mention of fluctuations: Secondary | ICD-10-CM | POA: Insufficient documentation

## 2010-03-23 DIAGNOSIS — R259 Unspecified abnormal involuntary movements: Secondary | ICD-10-CM | POA: Insufficient documentation

## 2010-03-24 NOTE — Progress Notes (Signed)
Summary: resend rx  Phone Note Call from Patient Call back at Home Phone [phone]   Caller: Patient Call For: Dr Jarold Motto Reason for Call: Talk to Nurse Summary of Call: Patient states that her movi prep is not in the pharmacy please resend it. Initial call taken by: Tawni Levy,  March 18, 2010 8:25 AM  Follow-up for Phone Call        resent to [address] [facility] Follow-up by: Harlow Mares CMA Duncan Dull),  March 18, 2010 8:31 AM    Prescriptions: MOVIPREP 100 GM  SOLR (PEG-KCL-NACL-NASULF-NA ASC-C) As per prep instructions.  #1 x 0   Entered by:   Harlow Mares CMA (AAMA)   Authorized by:   Mardella Layman MD [facility]   Signed by:   Harlow Mares CMA (AAMA) on 03/18/2010   Method used:   Electronically to        [facility]. [phone]* (retail)       [address]       [place], [place]  [address]       Ph: [phone]       Fax: [phone]   RxID:   [id number]

## 2010-03-25 ENCOUNTER — Encounter: Payer: Self-pay | Admitting: Gastroenterology

## 2010-03-28 ENCOUNTER — Telehealth: Payer: Self-pay | Admitting: Gastroenterology

## 2010-03-29 ENCOUNTER — Encounter: Payer: Self-pay | Admitting: Gastroenterology

## 2010-03-30 NOTE — Procedures (Addendum)
Summary: Upper Endoscopy  Patient: [name] Note: All result statuses are Final unless otherwise noted.  Tests: (1) Upper Endoscopy (EGD)   EGD Upper Endoscopy       DONE     [facility]     [address]     [place], [place]  [address]           ENDOSCOPY PROCEDURE REPORT           PATIENT:  Kristina Carr, Kristina Carr  MR#:  [id number]     BIRTHDATE:  1946-02-02, 64 yrs. old  GENDER:  female           ENDOSCOPIST:  Vania Rea. Jarold Motto, MD, [facility]     Referred by:  Marga Melnick, M.D.           PROCEDURE DATE:  03/21/2010     PROCEDURE:  EGD with biopsy, [address]     ASA CLASS:  Class II     INDICATIONS:  abdominal pain despite treatment           MEDICATIONS:   There was residual sedation effect present from     prior procedure., Fentanyl 12.5 mcg IV, Versed 2 mg IV     TOPICAL ANESTHETIC:  Exactacain Spray           DESCRIPTION OF PROCEDURE:   After the risks benefits and     alternatives of the procedure were thoroughly explained, informed     consent was obtained.  The LB GIF-H180 [id number] endoscope was     introduced through the mouth and advanced to the second portion of     the duodenum, without limitations.  The instrument was slowly     withdrawn as the mucosa was fully examined.     <<PROCEDUREIMAGES>>           ULTRASONIC FINDINGS:   A hiatal hernia was found.  CM. HH NOTED.     Moderate gastritis was found in the body and the antrum of the     stomach. SEE PICTURES.DRIED HEME IN ANTRAL AREA.CLO AND REGULAR     BX. DONE.  Duodenitis was found. NO ULCERATION.SMALL BOWEL BX,     DONE.  The esophagus and gastroesophageal junction were completely     normal in appearance.    Retroflexed views revealed a hiatal     hernia.    The scope was then withdrawn from the patient and the     procedure completed.           COMPLICATIONS:  None           ENDOSCOPIC IMPRESSION:     1) Moderate gastritis in the body and the antrum of the stomach           2) Duodenitis     3)  Hiatal hernia     4) Normal esophagus     NSAID DAMAGE.R/O H.PYLORI.     RECOMMENDATIONS:     1) Await biopsy results     2) Rx CLO if positive     3) continue PPI           REPEAT EXAM:  No           ______________________________     Vania Rea. Jarold Motto, MD, [facility]           CC:           n.     eSIGNED:   Vania Rea. Jazzlynn Rawe at 03/21/2010 03:57 PM  Kristina Carr, Kristina Carr, [id number]  Note: An exclamation mark (!) indicates a result that was not dispersed into the flowsheet. Document Creation Date: 03/21/2010 3:57 PM _______________________________________________________________________  (1) Order result status: Final Collection or observation date-time: 03/21/2010 15:48 Requested date-time:  Receipt date-time:  Reported date-time:  Referring Physician:   Ordering Physician: Sheryn Bison [phone]) Specimen Source:  Source: Launa Grill Order Number: [id number] Lab site:

## 2010-03-30 NOTE — Letter (Signed)
Summary: Appt Reminder 2  [facility]  [address] [place], [place] [address]   Phone: [phone]  Fax: [phone]        March 23, 2010 MRN: [id number]    [facility] [address] [place], [place]  [address]    Dear Ms. Gordy,   You have a return appointment with Dr.Patterson on 04/12/2010 at 10:30am. Please remember to bring a complete list of the medicines you are taking, your insurance card and your co-pay.  If you have to cancel or reschedule this appointment, please call before 5:00 pm the evening before to avoid a cancellation fee.  If you have any questions or concerns, please call [phone].    Sincerely,    Harlow Mares CMA (AAMA)  Appended Document: Appt Reminder 2 tried multiple times to contact pt about his appt without a reurn call back so I mailed a letter.

## 2010-03-30 NOTE — Miscellaneous (Signed)
Summary: Carafate RX  Clinical Lists Changes  Medications: Removed medication of MOVIPREP 100 GM  SOLR (PEG-KCL-NACL-NASULF-NA ASC-C) As per prep instructions. Added new medication of CARAFATE 1 GM/10ML SUSP (SUCRALFATE) take after meals and at bedtime - Signed Rx of CARAFATE 1 GM/10ML SUSP (SUCRALFATE) take after meals and at bedtime;  #1 month x 1;  Signed;  Entered by: Harlow Mares CMA (AAMA);  Authorized by: Mardella Layman MD [facility];  Method used: Electronically to [facility] [phone]*, [address], [place], [place]  [address], Ph: [phone], Fax:    Prescriptions: CARAFATE 1 GM/10ML SUSP (SUCRALFATE) take after meals and at bedtime  #1 month x 1   Entered by:   Harlow Mares CMA (AAMA)   Authorized by:   Mardella Layman MD [facility]   Signed by:   Harlow Mares CMA (AAMA) on 03/21/2010   Method used:   Electronically to        [facility] [phone]* (retail)       [address]       [place], [place]  [address]       Ph: [phone]       Fax:    RxID:   [id number]

## 2010-03-30 NOTE — Miscellaneous (Signed)
Summary: clo test  Clinical Lists Changes  Problems: Added new problem of GASTRITIS, ACUTE W/O HEMORRHAGE (ICD-535.00) Orders: Added new Test order of TLB-H Pylori Screen Gastric Biopsy (83013-CLOTEST) - Signed 

## 2010-03-30 NOTE — Procedures (Signed)
Summary: Colonoscopy  Patient: [name] Note: All result statuses are Final unless otherwise noted.  Tests: (1) Colonoscopy (COL)   COL Colonoscopy           DONE     [facility]     [address]     [place], [place]  [address]           COLONOSCOPY PROCEDURE REPORT           PATIENT:  Kristina Carr, Kristina Carr  MR#:  [id number]     BIRTHDATE:  [birth year], 64 yrs. old  GENDER:  female     ENDOSCOPIST:  Vania Rea. Jarold Motto, MD, [facility]     REF. BY:  Marga Melnick, M.D.     PROCEDURE DATE:  03/21/2010     PROCEDURE:  Average-risk screening colonoscopy     G0121     ASA CLASS:  Class II     INDICATIONS:  Routine Risk Screening     MEDICATIONS:   Fentanyl 75 mcg IV, Versed 8 mg IV           DESCRIPTION OF PROCEDURE:   After the risks benefits and     alternatives of the procedure were thoroughly explained, informed     consent was obtained.  Digital rectal exam was performed and     revealed no abnormalities.   The LB 180AL [id number] endoscope was     introduced through the anus and advanced to the cecum, which was     identified by both the appendix and ileocecal valve, limited by a     redundant colon.    The quality of the prep was good, using     MoviPrep.  The instrument was then slowly withdrawn as the colon     was fully examined.     <<PROCEDUREIMAGES>>           FINDINGS:  Moderate diverticulosis was found in the sigmoid to     descending colon segments.  No polyps or cancers were seen.  This     was otherwise a normal examination of the colon.   Retroflexed     views in the rectum revealed no abnormalities.    The scope was     then withdrawn from the patient and the procedure completed.           COMPLICATIONS:  None     ENDOSCOPIC IMPRESSION:     1) Moderate diverticulosis in the sigmoid to descending colon     segments     2) No polyps or cancers     3) Otherwise normal examination     RECOMMENDATIONS:     1) Continue current colorectal screening  recommendations for     "routine risk" patients with a repeat colonoscopy in 10 years.     2) high fiber diet     REPEAT EXAM:  No           ______________________________     Vania Rea. Jarold Motto, MD, [facility]           CC:           n.     eSIGNED:   Vania Rea. Patterson at 03/21/2010 03:51 PM           Drucilla Schmidt, [id number]  Note: An exclamation mark (!) indicates a result that was not dispersed into the flowsheet. Document Creation Date: 03/21/2010 3:51 PM _______________________________________________________________________  (1) Order result status: Final Collection or observation date-time: 03/21/2010 15:38  Requested date-time:  Receipt date-time:  Reported date-time:  Referring Physician:   Ordering Physician: Sheryn Bison [phone]) Specimen Source:  Source: Launa Grill Order Number: [id number] Lab site:   Appended Document: Colonoscopy    Clinical Lists Changes  Observations: Added new observation of COLONNXTDUE: 03/2020 (03/21/2010 16:25)

## 2010-04-05 NOTE — Progress Notes (Signed)
Summary: Medication ?s  Phone Note Call from Patient Call back at [facility] Phone [phone]   Caller: Patient Call For: dR. pATTERSON Reason for Call: Talk to Nurse Summary of Call: pt has some questions/concerns for the nurse about her medications Initial call taken by: [place] Johnson,  March 28, 2010 12:59 PM  Follow-up for Phone Call        advised pt to take carafate after meals. also reminded her of her appt 04/12/2010 Follow-up by: Harlow Mares CMA (AAMA),  March 28, 2010 1:06 PM

## 2010-04-05 NOTE — Letter (Signed)
Summary: Patient [facility] Biopsy Results  [facility]  [address] [place], [place] [address]   Phone: [phone]  Fax: [phone]        March 29, 2010 MRN: [id number]    [facility] [address] [place], [place]  [address]    Dear Ms. Hankin,  I am pleased to inform you that the biopsies taken during your recent endoscopic examination did not show any evidence of cancer upon pathologic examination.  Additional information/recommendations:  __No further action is needed at this time.  Please follow-up with      your primary care physician for your other healthcare needs.  __ Please call [phone] to schedule a return visit to review      your condition.  x__ Continue with the treatment plan as outlined on the day of your      exam.Biopsies for helicobacter infection were negative. Please avoid any aspirin or nonsteroidal anti-inflammatory medication if possible.  __ You should have a repeat endoscopic examination for this problem              in _ months/years.   Please call us if you are having persistent problems or have questions about your condition that have not been fully answered at this time.  Sincerely,  Mardella Layman MD [facility]  This letter has been electronically signed by your physician.  Appended Document: Patient Notice-Endo Biopsy Results letter mailed

## 2010-04-05 NOTE — Letter (Signed)
Summary: Patient [name] Biopsy Results  [facility]  [address] [place], [place] [address]   Phone: [phone]  Fax: [phone]        March 25, 2010 MRN: [id number]    [facility] [address] [place], [place]  [address]    Dear Kristina Carr,  I am pleased to inform you that the biopsies taken during your recent endoscopic examination did not show any evidence of cancer upon pathologic examination.  Additional information/recommendations:  __No further action is needed at this time.  Please follow-up with      your primary care physician for your other healthcare needs.  __ Please call [phone] to schedule a return visit to review      your condition.  _x_ Continue with the treatment plan as outlined on the day of your      exam.H.pylori exam negative...  __ You should have a repeat endoscopic examination for this problem              in _ months/years.   Please call us if you are having persistent problems or have questions about your condition that have not been fully answered at this time.  Sincerely,  Mardella Layman MD [facility]  This letter has been electronically signed by your physician.  Appended Document: Patient Notice-Endo Biopsy Results letter mailed

## 2010-04-11 ENCOUNTER — Ambulatory Visit: Payer: 59 | Attending: Neurology | Admitting: Physical Therapy

## 2010-04-11 DIAGNOSIS — G20 Parkinson's disease: Secondary | ICD-10-CM | POA: Insufficient documentation

## 2010-04-11 DIAGNOSIS — R259 Unspecified abnormal involuntary movements: Secondary | ICD-10-CM | POA: Insufficient documentation

## 2010-04-11 DIAGNOSIS — IMO0001 Reserved for inherently not codable concepts without codable children: Secondary | ICD-10-CM | POA: Insufficient documentation

## 2010-04-11 DIAGNOSIS — R269 Unspecified abnormalities of gait and mobility: Secondary | ICD-10-CM | POA: Insufficient documentation

## 2010-04-11 DIAGNOSIS — G20A1 Parkinson's disease without dyskinesia, without mention of fluctuations: Secondary | ICD-10-CM | POA: Insufficient documentation

## 2010-04-12 ENCOUNTER — Ambulatory Visit: Payer: 59 | Admitting: Physical Therapy

## 2010-04-12 ENCOUNTER — Ambulatory Visit (INDEPENDENT_AMBULATORY_CARE_PROVIDER_SITE_OTHER): Payer: 59 | Admitting: Gastroenterology

## 2010-04-12 ENCOUNTER — Encounter: Payer: Self-pay | Admitting: Gastroenterology

## 2010-04-12 ENCOUNTER — Other Ambulatory Visit: Payer: Self-pay | Admitting: Gastroenterology

## 2010-04-12 DIAGNOSIS — R1013 Epigastric pain: Secondary | ICD-10-CM

## 2010-04-13 ENCOUNTER — Ambulatory Visit: Payer: 59 | Admitting: Physical Therapy

## 2010-04-13 ENCOUNTER — Ambulatory Visit (INDEPENDENT_AMBULATORY_CARE_PROVIDER_SITE_OTHER)
Admission: RE | Admit: 2010-04-13 | Discharge: 2010-04-13 | Disposition: A | Discharge status: Home / Self Care | Payer: 59 | Source: Ambulatory Visit | Attending: Gastroenterology | Admitting: Gastroenterology

## 2010-04-13 DIAGNOSIS — R109 Unspecified abdominal pain: Secondary | ICD-10-CM

## 2010-04-13 MED ORDER — IOHEXOL 300 MG/ML  SOLN
100.0000 mL | Freq: Once | INTRAMUSCULAR | Status: AC | PRN
  Administered 2010-04-13: 100 mL via INTRAVENOUS

## 2010-04-14 ENCOUNTER — Ambulatory Visit: Payer: 59 | Admitting: Physical Therapy

## 2010-04-18 ENCOUNTER — Ambulatory Visit (INDEPENDENT_AMBULATORY_CARE_PROVIDER_SITE_OTHER): Payer: 59 | Admitting: Physical Therapy

## 2010-04-18 ENCOUNTER — Ambulatory Visit: Payer: 59 | Admitting: Physical Therapy

## 2010-04-18 LAB — POCT HEMOGLOBIN-HEMACUE: Hemoglobin: 15.1 g/dL — ABNORMAL HIGH (ref 12.0–15.0)

## 2010-04-19 ENCOUNTER — Ambulatory Visit: Payer: 59 | Admitting: Rehabilitative and Restorative Service Providers"

## 2010-04-19 NOTE — Assessment & Plan Note (Signed)
Summary: follow up after ECL/lk   History of Present Illness Visit Type: Follow-up Visit Primary GI MD: Sheryn Bison MD FACP FAGA Primary Provider: Marga Melnick, MD Requesting Provider: n/a Chief Complaint: Patient here to f/u after endoscopy/colonoscopy. She states that she did get better, however, she is now having generalized abdominal discomfort and burning.  History of Present Illness:   This Patient is abdominal pain for many years which has defied diagnosis. Recent endoscopy and colonoscopy were unremarkable except for some minor gastroduodenitis with negative biopsies for H. pylori and also celiac disease. She the past had urticaria with Prilosec and takes ranitidine 150 mg twice a day. She's had no improvement with liquid Carafate and continues to complain of vague epigastric discomfort with nausea and bloating. She refuses to accept the diagnosis of irritable bowel syndrome.   GI Review of Systems    Reports abdominal pain, loss of appetite, and  vomiting.     Location of  Abdominal pain: generalized.    Denies acid reflux, belching, bloating, chest pain, dysphagia with liquids, dysphagia with solids, heartburn, nausea, vomiting blood, weight loss, and  weight gain.      Reports constipation, diverticulosis, and  irritable bowel syndrome.     Denies anal fissure, black tarry stools, change in bowel habit, diarrhea, fecal incontinence, heme positive stool, hemorrhoids, jaundice, light color stool, liver problems, rectal bleeding, and  rectal pain.    Current Medications (verified): 1)  Valium 5 Mg  Tabs (Diazepam) .... Take 1 Tablet By Mouth As Needed 2)  Ambien 5 Mg  Tabs (Zolpidem Tartrate) .Marland Kitchen.. 1 By Mouth Hs As Needed 3)  Requip 2 Mg Tabs (Ropinirole Hcl) .Marland Kitchen.. 1 Po  Two Times A Day 4)  Ranitidine Hcl 150 Mg Tabs (Ranitidine Hcl) .Marland Kitchen.. 1 Two Times A Day Pre Meals 5)  Vesicare 5 Mg Tabs (Solifenacin Succinate) .Marland Kitchen.. 1 By Mouth Once Daily 6)  Aspirin 81 Mg Tbec (Aspirin) .Marland Kitchen.. 1  By Mouth Once Daily 7)  Carafate 1 Gm/69ml Susp (Sucralfate) .... Take After Meals and At Bedtime  Allergies (verified): 1)  ! Prilosec 2)  ! Effexor 3)  ! Simvastatin  Past History:  Past medical, surgical, family and social histories (including risk factors) reviewed for relevance to current acute and chronic problems.  Past Medical History: Reviewed history from 03/11/2010 and no changes required. ACID REFLUX DISEASE (ICD-530.81) RESTLESS LEG SYNDROME, HX OF (ICD-V12.49) DYSLIPIDEMIA (ICD-272.4); NMR 2007: LDL 148(2458/1873) ,HDL 41, TG 154. LDL goal = < 100.[place] LDL goal = < 130.  Current Problems:  HIATAL HERNIA WITH REFLUX (ICD-553.3) EPIGASTRIC DISCOMFORT (ICD-789.06) ESOPHAGEAL REFLUX (ICD-530.81) LESION, BLADDER (ICD-596.9)  Bladder Cancer ABDOMINAL BLOATING (ICD-787.3) IRRITABLE BOWEL SYNDROME (ICD-564.1) ARTHRALGIA (ICD-719.40) HYPERGLYCEMIA, FASTING (ICD-790.29) GAIT DISTURBANCE (ICD-781.2) NECK PAIN, CHRONIC (ICD-723.1) LOW BACK PAIN, CHRONIC (ICD-724.2) NONSPEC ELEVATION OF LEVELS OF TRANSAMINASE/LDH (ICD-790.4) FATIGUE (ICD-780.79) ANXIETY STATE, UNSPECIFIED (ICD-300.00) PALPITATIONS (ICD-785.1) CHEST PAIN, ATYPICAL (ICD-786.59) Hx of ACID REFLUX DISEASE (ICD-530.81) RESTLESS LEG SYNDROME, HX OF (ICD-V12.49) DYSLIPIDEMIA (ICD-272.4)] Parkinson's Disease  Past Surgical History: Appendectomy Lumbar laminectomy x 3, Dr Phillipd & Dr Roxan Hockey Right breast surgery- benign lesion Left foot surgery for bunion 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 Father: Cardiac arrthymias, lung cancer Mother: Colon cancer, breast cancer, Gyn  cancer, HTN Siblings: sister: palpitations MGM:  stomach cancer  Maternal aunt:  Breast cancer   (died 50) PGF:  Prostate cancer PGGF:  Prostate cancer  Social History: Reviewed history from 03/11/2010 and no changes required. Never Smoked Alcohol use-yes-rare Diet none Occupation: Volunteer @ [employer] Regular exercise-no due to back issues-physical therapy Married 1 boy  Review of Systems       The patient complains of anxiety-new, back pain, depression-new, sleeping problems, sore throat, and voice change.  The patient denies allergy/sinus, anemia, arthritis/joint pain, blood in urine, breast changes/lumps, change in vision, confusion, cough, coughing up blood, fainting, fatigue, fever, headaches-new, hearing problems, heart murmur, heart rhythm changes, itching, menstrual pain, muscle pains/cramps, night sweats, nosebleeds, pregnancy symptoms, shortness of breath, skin rash, swelling of feet/legs, swollen lymph glands, thirst - excessive , urination - excessive , urination changes/pain, urine leakage, and vision changes.    Vital Signs:  Patient profile:   65 year old female Height:      65 inches Weight:      162 pounds BMI:     27.06 BSA:     1.81 Pulse rate:   84 / minute Pulse rhythm:   regular BP sitting:   100 / 62  (left arm)  Vitals Entered By: Lamona Curl CMA Duncan Dull) (April 12, 2010 11:11 AM)  Physical Exam  General:  Well developed, well nourished, no acute distress.healthy appearing.   Head:  Normocephalic and atraumatic. Eyes:  PERRLA, no icterus.exam deferred to patient's ophthalmologist.   Abdomen:  Soft, nontender and nondistended. No masses, hepatosplenomegaly or hernias noted. Normal bowel sounds.No distention masses or tenderness. Psych:  depressed affect and anxious.     Impression & Recommendations:  Problem # 1:  ABDOMINAL PAIN, GENERALIZED (ICD-789.07) Assessment Unchanged I am convinced this patient has functional GI complaints that are chronic in nature. Review of all her x-rays, endoscopic procedures, and labs was performed the day  over many years time. I decided to complete her workup a CT scan of the abdomen. Should this be unremarkable we will try AcipHex 20 mg a day and discontinue ranitidine. If she is not happy with this decision we'll refer her to [facility] for another opinion. Orders: CT Abdomen (CT Abd)  Problem # 2:  PARKINSON'S DISEASE (ICD-332.0) Assessment: Unchanged she is under neurology care on Requip 2 mg b.i.d.  Problem # 3:  ANXIETY STATE, UNSPECIFIED (ICD-300.00) Assessment: Unchanged she also recently has had cystoscopic removal some bladder tumors. She's had multiple surgical procedures as listed in her chart. I think we may need to consider psychiatric referral for management of her anxiety and depression. She currently is on Valium and Ambien. Only this decision to Dr. Alwyn Ren her primary care physician.  Patient Instructions: 1)  Copy sent to : Marga Melnick, MD 2)  Stop Carafate. 3)  Your CT Scan is schedule for 04/13/2010, please follow the seperate instructions. 4)  The medication list was reviewed and reconciled.  All changed / newly prescribed medications were explained.  A complete medication list was provided to the patient / caregiver.

## 2010-04-20 ENCOUNTER — Ambulatory Visit: Payer: 59 | Admitting: Physical Therapy

## 2010-04-21 ENCOUNTER — Ambulatory Visit: Payer: 59 | Admitting: Physical Therapy

## 2010-04-25 ENCOUNTER — Ambulatory Visit: Payer: 59 | Admitting: Physical Therapy

## 2010-04-26 ENCOUNTER — Telehealth: Payer: Self-pay | Admitting: Gastroenterology

## 2010-04-26 NOTE — Telephone Encounter (Signed)
She needs ov 

## 2010-04-26 NOTE — Telephone Encounter (Signed)
Ov made for 05/10/2010, pt aware

## 2010-04-26 NOTE — Telephone Encounter (Signed)
Patient states that she is doing no better still having some generalized abdominal pain now that her CT scan and EGD are normal she would like to know what the next step is for her since she is no better.

## 2010-04-27 ENCOUNTER — Ambulatory Visit: Payer: 59 | Admitting: Physical Therapy

## 2010-04-27 MED ORDER — RABEPRAZOLE SODIUM 20 MG PO TBEC: 20.0000 mg | DELAYED_RELEASE_TABLET | Freq: Every day | ORAL | Status: DC

## 2010-04-27 NOTE — Telephone Encounter (Signed)
Addended by: Harlow Mares on: 04/27/2010 08:21 AM   Modules accepted: Orders

## 2010-04-28 ENCOUNTER — Ambulatory Visit: Payer: 59 | Admitting: Physical Therapy

## 2010-05-02 ENCOUNTER — Ambulatory Visit: Payer: 59 | Admitting: Physical Therapy

## 2010-05-04 ENCOUNTER — Ambulatory Visit: Payer: 59 | Admitting: Physical Therapy

## 2010-05-05 ENCOUNTER — Ambulatory Visit: Payer: 59 | Admitting: Physical Therapy

## 2010-05-10 ENCOUNTER — Ambulatory Visit: Payer: 59 | Attending: Internal Medicine | Admitting: Physical Therapy

## 2010-05-10 ENCOUNTER — Ambulatory Visit (INDEPENDENT_AMBULATORY_CARE_PROVIDER_SITE_OTHER): Payer: 59 | Admitting: Gastroenterology

## 2010-05-10 ENCOUNTER — Encounter: Payer: Self-pay | Admitting: Gastroenterology

## 2010-05-10 DIAGNOSIS — G20 Parkinson's disease: Secondary | ICD-10-CM | POA: Insufficient documentation

## 2010-05-10 DIAGNOSIS — IMO0001 Reserved for inherently not codable concepts without codable children: Secondary | ICD-10-CM | POA: Insufficient documentation

## 2010-05-10 DIAGNOSIS — R269 Unspecified abnormalities of gait and mobility: Secondary | ICD-10-CM | POA: Insufficient documentation

## 2010-05-10 DIAGNOSIS — G20A1 Parkinson's disease without dyskinesia, without mention of fluctuations: Secondary | ICD-10-CM | POA: Insufficient documentation

## 2010-05-10 DIAGNOSIS — R259 Unspecified abnormal involuntary movements: Secondary | ICD-10-CM | POA: Insufficient documentation

## 2010-05-10 DIAGNOSIS — K219 Gastro-esophageal reflux disease without esophagitis: Secondary | ICD-10-CM

## 2010-05-10 MED ORDER — LUBIPROSTONE 8 MCG PO CAPS: 8.0000 ug | ORAL_CAPSULE | Freq: Two times a day (BID) | ORAL | Status: AC

## 2010-05-10 MED ORDER — POLYETHYLENE GLYCOL 3350 17 GM/SCOOP PO POWD: 17.0000 g | Freq: Every day | ORAL | Status: AC

## 2010-05-10 NOTE — Progress Notes (Signed)
This is a 65 year old Caucasian female with Parkinson's disease improved on Requip 2 mg twice a day, she's followed by Dr. Lesia Sago in neurology. She has gastroesophageal reflux disease which is improved on AcipHex 20 mg a day. In the past she was intolerant to PPIs therapy. She also has constipation predominant IBS with abdominal gas, Lodine, and vague abdominal discomfort. Of her symptoms have been complicated by anxiety and depression, right pain syndrome, and a history of bladder cancer with bladder emptying problems alleviated by VESIcare 5 mg a day. Unfortunately this does worsen her constipation, gas and bleeding. She's had extensive GI workups recently including endoscopy, colonoscopy, and CT scans of the abdomen. She had recent urologic referral and repeat cystoscopy by Dr. Manon Hilding.  I have reviewed this patient's present history, medical and surgical past history, allergies and medications.    Pertinent Review of Systems Negative   Physical Exam: Well-developed Caucasian female appearing her stated age with no obvious tremor or movement disorder. Her gait is slightly wide based but appears normal. There is no resting or intentional tremor. Examination oropharynx is unremarkable. Chest is clear there are no significant murmurs gallops or rubs. Her abdomen is nondistended without organomegaly, masses or tenderness. Bowels sounds are normal. There is no peripheral edema, phlebitis, or swollen joints. Mental status is generally normal.   Assessment and plan : Her as reflux is actually improved on PPI therapy, is difficult to convince this patient of any positive health initiatives. I've urged to continue daily AcipHex, ranitidine at bedtime, and office followup in several weeks' time. For her constipation I have advised nightly MiraLax 8 ounces, and we will give her a trial of Amitiza 8 mcg twice a day. Also she's been advised to use a high fiber diet with liberal by mouth fluids.  Encounter  Diagnoses  Name Primary?  . Esophageal reflux   . Constipation

## 2010-05-10 NOTE — Patient Instructions (Signed)
Your prescription(s) have been sent to you pharmacy.  Take Amitiza one tablet with meals twice a day. Take Miralax nightly.

## 2010-05-12 ENCOUNTER — Ambulatory Visit: Payer: 59

## 2010-05-12 ENCOUNTER — Ambulatory Visit: Payer: 59 | Admitting: Physical Therapy

## 2010-05-16 ENCOUNTER — Telehealth: Payer: Self-pay | Admitting: Gastroenterology

## 2010-05-16 NOTE — Telephone Encounter (Signed)
NO

## 2010-05-16 NOTE — Telephone Encounter (Signed)
Pt reports the Amitiza made her more nauseous- she took 6 tabs, but couldn't tolerate any more. Asked if she was following Dr Norval Gable instructions for high fiber diet, Miralax daily and to increase fluids; pt stated she is doing all these interventions. Dr Jarold Motto, pt wants to know if you have any more suggestions for her?

## 2010-05-16 NOTE — Telephone Encounter (Signed)
Informed pt Dr Jarold Motto has no new meds to offer; please continue the hi fiber diet, increased fluids and Miralax. Pt verbalized understanding.

## 2010-05-17 ENCOUNTER — Ambulatory Visit: Payer: 59 | Admitting: Occupational Therapy

## 2010-05-18 ENCOUNTER — Ambulatory Visit: Payer: 59 | Admitting: Occupational Therapy

## 2010-05-23 ENCOUNTER — Ambulatory Visit: Payer: 59 | Admitting: Physical Therapy

## 2010-05-23 ENCOUNTER — Encounter: Payer: 59 | Admitting: Occupational Therapy

## 2010-05-26 ENCOUNTER — Ambulatory Visit: Payer: 59 | Admitting: Physical Therapy

## 2010-05-31 ENCOUNTER — Ambulatory Visit: Payer: 59 | Admitting: Occupational Therapy

## 2010-05-31 ENCOUNTER — Encounter: Payer: Self-pay | Admitting: Gastroenterology

## 2010-05-31 ENCOUNTER — Ambulatory Visit (INDEPENDENT_AMBULATORY_CARE_PROVIDER_SITE_OTHER): Payer: 59 | Admitting: Gastroenterology

## 2010-05-31 VITALS — BP 120/68 | HR 88 | Ht 64.0 in | Wt 163.0 lb

## 2010-05-31 DIAGNOSIS — K5900 Constipation, unspecified: Secondary | ICD-10-CM

## 2010-05-31 DIAGNOSIS — K3184 Gastroparesis: Secondary | ICD-10-CM

## 2010-05-31 DIAGNOSIS — K219 Gastro-esophageal reflux disease without esophagitis: Secondary | ICD-10-CM

## 2010-05-31 DIAGNOSIS — K589 Irritable bowel syndrome without diarrhea: Secondary | ICD-10-CM

## 2010-05-31 NOTE — Progress Notes (Signed)
History of Present Illness: This is a 65 year old Caucasian female who has had extensive negative GI workups. She has suspected Parkinson's disease is on Requip 8 mg a day. He continues with early satiety, nausea vomiting, and constipation--- all refractory to numerous different medications and therapies. Recent endoscopy and colonoscopy were unremarkable including multiple biopsies.She Is concerned as she has celiac disease, but explained to her the small bowel biopsy was normal. His not responded to PPI therapy, in fact says she is allergic to these meds, Amitiza, MiraLax, and fiber supplements. Despite all these complaints she has no anorexia, weight loss, or systemic complaints. Current Medications, Allergies, Past Medical History, Past Surgical History, Family History and Social History were reviewed in [employer] record.   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 Other considerations are strong functional problems with associated anxiety and depression. I have ordered technetium gastric emptying scan and we'll proceed accordingly. She is not satisfied with her diagnosis and therapy, we were for her to [facility] for another opinion. Encounter Diagnosis  Name Primary?  . Gastroparesis Yes

## 2010-05-31 NOTE — Patient Instructions (Addendum)
Your Gastric Empty Scan in scheduled on 06/15/2010 9:00am, please arrive 15 min early at [facility] and have nothing to eat or drink after midnight. STOP YOUR ACIPHEX AND ZANTAC 24 HOURS BEFORE THE TEST

## 2010-06-01 ENCOUNTER — Encounter: Payer: 59 | Admitting: Occupational Therapy

## 2010-06-07 ENCOUNTER — Encounter: Payer: 59 | Admitting: Occupational Therapy

## 2010-06-14 ENCOUNTER — Ambulatory Visit: Payer: 59 | Attending: Neurology | Admitting: Occupational Therapy

## 2010-06-14 DIAGNOSIS — IMO0001 Reserved for inherently not codable concepts without codable children: Secondary | ICD-10-CM | POA: Insufficient documentation

## 2010-06-14 DIAGNOSIS — R269 Unspecified abnormalities of gait and mobility: Secondary | ICD-10-CM | POA: Insufficient documentation

## 2010-06-14 DIAGNOSIS — G20A1 Parkinson's disease without dyskinesia, without mention of fluctuations: Secondary | ICD-10-CM | POA: Insufficient documentation

## 2010-06-14 DIAGNOSIS — R259 Unspecified abnormal involuntary movements: Secondary | ICD-10-CM | POA: Insufficient documentation

## 2010-06-14 DIAGNOSIS — G20 Parkinson's disease: Secondary | ICD-10-CM | POA: Insufficient documentation

## 2010-06-15 ENCOUNTER — Other Ambulatory Visit (HOSPITAL_COMMUNITY): Payer: 59

## 2010-06-16 ENCOUNTER — Encounter (HOSPITAL_COMMUNITY)
Admission: RE | Admit: 2010-06-16 | Discharge: 2010-06-16 | Disposition: A | Discharge status: Home / Self Care | Payer: 59 | Source: Ambulatory Visit | Attending: Gastroenterology | Admitting: Gastroenterology

## 2010-06-16 ENCOUNTER — Encounter: Payer: 59 | Admitting: Occupational Therapy

## 2010-06-16 DIAGNOSIS — R142 Eructation: Secondary | ICD-10-CM | POA: Insufficient documentation

## 2010-06-16 DIAGNOSIS — R110 Nausea: Secondary | ICD-10-CM | POA: Insufficient documentation

## 2010-06-16 DIAGNOSIS — K3184 Gastroparesis: Secondary | ICD-10-CM | POA: Insufficient documentation

## 2010-06-16 DIAGNOSIS — R141 Gas pain: Secondary | ICD-10-CM | POA: Insufficient documentation

## 2010-06-16 MED ORDER — TECHNETIUM TC 99M SULFUR COLLOID: 1.9000 | Freq: Once | INTRAVENOUS | Status: AC | PRN

## 2010-06-17 ENCOUNTER — Telehealth: Payer: Self-pay | Admitting: *Deleted

## 2010-06-17 NOTE — Telephone Encounter (Signed)
Patient will think about [facility] referral and call back.

## 2010-06-17 NOTE — Telephone Encounter (Signed)
Message copied by Harlow Mares on Fri Jun 17, 2010  9:15 AM ------      Message from: Jarold Motto, DAVID      Created: Fri Jun 17, 2010  8:38 AM       Normal exam...offer [facility] referral..

## 2010-08-03 ENCOUNTER — Other Ambulatory Visit: Payer: Self-pay | Admitting: Obstetrics and Gynecology

## 2010-08-03 DIAGNOSIS — Z1231 Encounter for screening mammogram for malignant neoplasm of breast: Secondary | ICD-10-CM

## 2010-08-16 ENCOUNTER — Ambulatory Visit
Admission: RE | Admit: 2010-08-16 | Discharge: 2010-08-16 | Disposition: A | Discharge status: Home / Self Care | Payer: 59 | Source: Ambulatory Visit | Attending: Obstetrics and Gynecology | Admitting: Obstetrics and Gynecology

## 2010-08-16 DIAGNOSIS — Z1231 Encounter for screening mammogram for malignant neoplasm of breast: Secondary | ICD-10-CM

## 2010-08-18 ENCOUNTER — Encounter: Payer: Self-pay | Admitting: Internal Medicine

## 2010-08-18 ENCOUNTER — Ambulatory Visit (INDEPENDENT_AMBULATORY_CARE_PROVIDER_SITE_OTHER): Payer: 59 | Admitting: Internal Medicine

## 2010-08-18 DIAGNOSIS — R079 Chest pain, unspecified: Secondary | ICD-10-CM

## 2010-08-18 LAB — CK TOTAL AND CKMB (NOT AT ARMC): Total CK: 90 U/L (ref 7–177)

## 2010-08-18 NOTE — Patient Instructions (Addendum)
The triggers for dyspepsia or "heart burn"  include stress; the "aspirin family" ; alcohol; peppermint; and caffeine (coffee, tea, cola, and chocolate). The aspirin family would include aspirin and the nonsteroidal agents such as ibuprofen &  Naproxen. Tylenol would not cause reflux. If having dyspepsia ; food & dink should be avoided for @ least 2 hors before going to bed. Please take  AcipHex before b'fast & eve meal.  Use pHisoHex to clean the buttocks area. Report  Pustules or fever.

## 2010-08-18 NOTE — Progress Notes (Signed)
  Subjective:    Patient ID: Kristina Carr, female    DOB: Feb 04, 1946, 65 y.o.   MRN: [id number]  HPI CHEST PAIN: Location: SS  Quality: sharp Duration: up to 25 minutes  Onset (rest, exertion): 7/7 in bed Radiation: no  Better with: no relievers  Worse with: no exacerbating factors  Symptoms History of Trauma/lifting: no  Nausea/vomiting: no  Diaphoresis: no  Shortness of breath: yes, minor  Pleuritic: no  Cough: no  Edema: no  Orthopnea: no  PND: no  Dizziness: no  Palpitations: yes; even w/o chest pain  Syncope: no  Indigestion: no, if Aciphex taken   Red Flags Worse with exertion: no  Recent Immobility: no  Tearing/radiation to back: no  FH: mother HTN; no CAD/MI       Review of Systems i She has  had skin lesions of the buttocks for years. They've recently   have become more tender with pressure     Objective:   Physical Exam General appearance is one of good health and nourishment w/o distress.  Eyes: No conjunctival inflammation or scleral icterus is present.  Oral exam: Dental hygiene is good; lips and gums are healthy appearing.There is no oropharyngeal erythema or exudate noted.   Heart:  Normal rate and regular rhythm. S1 and S2 normal without gallop, murmur, click, rub. S4 with slurring   Lungs:Chest clear to auscultation; no wheezes, rhonchi,rales ,or rubs present.No increased work of breathing.  There is no tenderness to palpation over the sternum   Abdomen: bowel sounds normal, soft and non-tender without masses, organomegaly or hernias noted.  No guarding or rebound   Skin:Warm & dry.  Intact without suspicious lesions or rashes ; no jaundice or tenting. Minor folliculitis over buttocks  All pulses intact without  bruits .No ischemic skin changes.    Lymphatic: No lymphadenopathy is noted about the head, neck, axilla  areas.  Psych:  Cognition and judgment appear intact. Alert, communicative  and cooperative with normal attention span and  concentration.             Assessment & Plan:  #1 chest pain atypical; most likely his reflux with esophageal spasm  #2 mild folliculitis of the buttocks without cellulitis. No evidence of any herpetic infection.  Plan: See orders and patient instructions

## 2010-08-23 ENCOUNTER — Encounter (INDEPENDENT_AMBULATORY_CARE_PROVIDER_SITE_OTHER): Payer: 59 | Admitting: Obstetrics and Gynecology

## 2010-08-23 ENCOUNTER — Other Ambulatory Visit (HOSPITAL_COMMUNITY)
Admission: RE | Admit: 2010-08-23 | Discharge: 2010-08-23 | Disposition: A | Discharge status: Home / Self Care | Payer: 59 | Source: Ambulatory Visit | Attending: Obstetrics and Gynecology | Admitting: Obstetrics and Gynecology

## 2010-08-23 ENCOUNTER — Other Ambulatory Visit: Payer: Self-pay | Admitting: Obstetrics and Gynecology

## 2010-08-23 DIAGNOSIS — Z124 Encounter for screening for malignant neoplasm of cervix: Secondary | ICD-10-CM | POA: Insufficient documentation

## 2010-08-23 DIAGNOSIS — Z01419 Encounter for gynecological examination (general) (routine) without abnormal findings: Secondary | ICD-10-CM

## 2010-08-23 DIAGNOSIS — R82998 Other abnormal findings in urine: Secondary | ICD-10-CM

## 2010-10-05 ENCOUNTER — Telehealth: Payer: Self-pay | Admitting: Internal Medicine

## 2010-10-05 MED ORDER — ZOSTER VACCINE LIVE 19400 UNT/0.65ML ~~LOC~~ SOLR: 0.6500 mL | Freq: Once | SUBCUTANEOUS | Status: DC

## 2010-10-05 NOTE — Telephone Encounter (Signed)
OK 

## 2010-10-05 NOTE — Telephone Encounter (Signed)
Patient aware rx sent to pharmacy.  

## 2010-11-14 ENCOUNTER — Ambulatory Visit (INDEPENDENT_AMBULATORY_CARE_PROVIDER_SITE_OTHER): Payer: 59

## 2010-11-14 DIAGNOSIS — Z23 Encounter for immunization: Secondary | ICD-10-CM

## 2011-01-12 ENCOUNTER — Encounter: Payer: Self-pay | Admitting: Internal Medicine

## 2011-01-12 ENCOUNTER — Ambulatory Visit (INDEPENDENT_AMBULATORY_CARE_PROVIDER_SITE_OTHER): Payer: 59 | Admitting: Internal Medicine

## 2011-01-12 VITALS — BP 120/88 | HR 92 | Temp 98.0°F | Resp 18 | Wt 175.0 lb

## 2011-01-12 DIAGNOSIS — J069 Acute upper respiratory infection, unspecified: Secondary | ICD-10-CM

## 2011-01-12 MED ORDER — AZITHROMYCIN 250 MG PO TABS: ORAL_TABLET | ORAL | Status: AC

## 2011-01-12 MED ORDER — BENZONATATE 100 MG PO CAPS
100.0000 mg | ORAL_CAPSULE | Freq: Three times a day (TID) | ORAL | Status: DC | PRN
Start: 2011-01-12 — End: 2011-01-26

## 2011-01-13 DIAGNOSIS — J069 Acute upper respiratory infection, unspecified: Secondary | ICD-10-CM | POA: Insufficient documentation

## 2011-01-13 NOTE — Progress Notes (Signed)
  Subjective:    Patient ID: Kristina Carr, female    DOB: 08-Apr-1945, 65 y.o.   MRN: [id number]  HPI Pt presents to clinic for evaluation of cough. Notes 4 day h/o NP cough. +associated nasal drainage. No f/c. No alleviating or exacerbating factors. No other complaints.  Past Medical History  Diagnosis Date  . Hiatal hernia   . Esophageal reflux   . Personal history of other disorders of nervous system and sense organs   . Other and unspecified hyperlipidemia   . Abdominal pain, epigastric   . Bladder cancer   . Flatulence, eructation, and gas pain   . Irritable bowel syndrome   . Pain in joint, site unspecified   . Other abnormal glucose   . Abnormality of gait   . Cervicalgia   . Lumbago   . Nonspecific elevation of levels of transaminase or lactic acid dehydrogenase (LDH)   . Other malaise and fatigue   . Anxiety state, unspecified   . Palpitations   . Other chest pain   . Restless leg syndrome   . Other and unspecified hyperlipidemia   . Parkinson disease    Past Surgical History  Procedure Date  . Appendectomy   . Lumbar laminectomy     X 3  . Breast surgery   . Bunionectomy     left  . Trigger finger release     X2  . Bladder tumor excision   . Cesarean section     reports that she has never smoked. She does not have any smokeless tobacco history on file. She reports that she does not drink alcohol or use illicit drugs. family history includes Breast cancer in her maternal aunt and mother; Colon cancer in her mother; Lung cancer in her father; Prostate cancer in her maternal grandfather, paternal grandfather, and unspecified family member; and Stomach cancer in her maternal grandmother. Allergies  Allergen Reactions  . Omeprazole     REACTION: rash  . Simvastatin     REACTION: LEG CRAMPS  . Venlafaxine     REACTION: nausea     Review of Systems see hpi     Objective:   Physical Exam  Nursing note and vitals reviewed. Constitutional: She appears  well-developed and well-nourished. No distress.  HENT:  Head: Normocephalic and atraumatic.  Right Ear: Tympanic membrane, external ear and ear canal normal.  Left Ear: Tympanic membrane, external ear and ear canal normal.  Nose: Nose normal.  Mouth/Throat: Oropharynx is clear and moist. No oropharyngeal exudate.  Eyes: Conjunctivae are normal. Right eye exhibits no discharge. Left eye exhibits no discharge. No scleral icterus.  Neck: Neck supple.  Cardiovascular: Normal rate, regular rhythm and normal heart sounds.   Pulmonary/Chest: Effort normal and breath sounds normal. No respiratory distress. She has no wheezes. She has no rales.  Lymphadenopathy:    She has no cervical adenopathy.  Neurological: She is alert.  Skin: Skin is warm and dry. She is not diaphoretic.  Psychiatric: She has a normal mood and affect.          Assessment & Plan:

## 2011-01-13 NOTE — Assessment & Plan Note (Signed)
Currently suspect viral etiology however given printed abx to hold. Begin abx if sx's do not improve after total duration of 8-10 days. Tessalon perles prn cough. Followup if no improvement or worsening.

## 2011-01-25 ENCOUNTER — Telehealth: Payer: Self-pay | Admitting: *Deleted

## 2011-01-25 NOTE — Telephone Encounter (Signed)
Pt c/o being in a store approx 1 hr ago and having chest pains that radiated down back of neck & throat, lasting about 10 minutes; she is not having any pain at this time but would like to know if you would like her to come into office for evaluation.?

## 2011-01-25 NOTE — Telephone Encounter (Signed)
Same day chest pain even if resolved should warrant [facility]  urgent care visit

## 2011-01-26 ENCOUNTER — Emergency Department (HOSPITAL_COMMUNITY)
Admission: EM | Admit: 2011-01-26 | Discharge: 2011-01-26 | Disposition: A | Discharge status: Nursing Home (Includes ICF) | Payer: 59 | Source: Home / Self Care | Attending: Emergency Medicine | Admitting: Emergency Medicine

## 2011-01-26 ENCOUNTER — Encounter (HOSPITAL_COMMUNITY): Payer: Self-pay | Admitting: *Deleted

## 2011-01-26 ENCOUNTER — Other Ambulatory Visit: Payer: Self-pay

## 2011-01-26 ENCOUNTER — Emergency Department (HOSPITAL_COMMUNITY): Payer: 59

## 2011-01-26 ENCOUNTER — Emergency Department (HOSPITAL_COMMUNITY)
Admission: EM | Admit: 2011-01-26 | Discharge: 2011-01-26 | Disposition: A | Discharge status: Home / Self Care | Payer: 59 | Attending: Emergency Medicine | Admitting: Emergency Medicine

## 2011-01-26 DIAGNOSIS — G20 Parkinson's disease: Secondary | ICD-10-CM | POA: Insufficient documentation

## 2011-01-26 DIAGNOSIS — R079 Chest pain, unspecified: Secondary | ICD-10-CM | POA: Insufficient documentation

## 2011-01-26 DIAGNOSIS — Z79899 Other long term (current) drug therapy: Secondary | ICD-10-CM | POA: Insufficient documentation

## 2011-01-26 DIAGNOSIS — K219 Gastro-esophageal reflux disease without esophagitis: Secondary | ICD-10-CM | POA: Insufficient documentation

## 2011-01-26 DIAGNOSIS — K589 Irritable bowel syndrome without diarrhea: Secondary | ICD-10-CM | POA: Insufficient documentation

## 2011-01-26 DIAGNOSIS — Z7982 Long term (current) use of aspirin: Secondary | ICD-10-CM | POA: Insufficient documentation

## 2011-01-26 DIAGNOSIS — E785 Hyperlipidemia, unspecified: Secondary | ICD-10-CM | POA: Insufficient documentation

## 2011-01-26 DIAGNOSIS — Z8551 Personal history of malignant neoplasm of bladder: Secondary | ICD-10-CM | POA: Insufficient documentation

## 2011-01-26 DIAGNOSIS — G20A1 Parkinson's disease without dyskinesia, without mention of fluctuations: Secondary | ICD-10-CM | POA: Insufficient documentation

## 2011-01-26 LAB — CBC
HCT: 43.1 % (ref 36.0–46.0)
Hemoglobin: 14.7 g/dL (ref 12.0–15.0)
MCH: 30.5 pg (ref 26.0–34.0)
MCHC: 34.1 g/dL (ref 30.0–36.0)
MCV: 89.4 fL (ref 78.0–100.0)
Platelets: 279 10*3/uL (ref 150–400)
RBC: 4.82 MIL/uL (ref 3.87–5.11)
RDW: 13.2 % (ref 11.5–15.5)
WBC: 7.8 10*3/uL (ref 4.0–10.5)

## 2011-01-26 LAB — BASIC METABOLIC PANEL
BUN: 18 mg/dL (ref 6–23)
CO2: 27 mEq/L (ref 19–32)
Calcium: 9.8 mg/dL (ref 8.4–10.5)
Chloride: 103 mEq/L (ref 96–112)
Creatinine, Ser: 0.83 mg/dL (ref 0.50–1.10)
GFR calc Af Amer: 85 mL/min — ABNORMAL LOW (ref >90)
GFR calc non Af Amer: 73 mL/min — ABNORMAL LOW (ref >90)
Glucose, Bld: 100 mg/dL — ABNORMAL HIGH (ref 70–99)
Potassium: 3.8 mEq/L (ref 3.5–5.1)
Sodium: 139 mEq/L (ref 135–145)

## 2011-01-26 NOTE — ED Notes (Signed)
Called to waiting area to evaluate this pt. Pt reports that "while at [employer] yesterday, I had some pain all of a sudden on my breasts. The pain went up my body and out my head. It lasted for about 15 minutes. I got to shaking, but that was probably my Parkinson's and nerves; I had to sit down. It lasted for about 15 minutes. I called my husband to come pick me up in case it happened again." Pt reports s/s resolved on their own. Pt denies nausea, vomiting, shortness of breath, dizziness during this episode. Called her doctor today and reported this...MD told her that she should be evaluated for same. At this time, pt is pain free, has no complaints. Pt speaks in complete multiple word sentences. Pt has not had any CP or associated s/s since this aforementioned event at [employer], per the pt. Pt

## 2011-01-26 NOTE — ED Notes (Signed)
Sent to ed from [facility] for eval of cp yesterday for 15 min while shopping. States the pain was in her epigastric area and radiated up 'behind my throat'. States she was 'shaking'. No nausea or vomiting.

## 2011-01-26 NOTE — ED Provider Notes (Addendum)
History     CSN: [id number]  Arrival date & time 01/26/11  1510   First MD Initiated Contact with Patient 01/26/11 1631      Chief Complaint  Patient presents with  . Chest Pain    HPI Comments: Pt with 15 min episode of sharp left sided CP lasting 15 min with radiation to neck and head with nausea, lightheadedness, weakness yesterday while shopping. Sat down and pain resolved. No diaphoresis, palpitations, cough, wheeze, SOB, abd pain. No palpitations, syncope. No change in diet. H/o atypical CP in past but only cardiac workup was stress test "years ago" per pt. No further episodes of pain since yesterday.   Patient is a 65 y.o. female presenting with chest pain. The history is provided by the patient. No language interpreter was used.  Chest Pain The chest pain began yesterday. Duration of episode(s) is 15 minutes. The pain is associated with exertion. The quality of the pain is described as sharp. Primary symptoms include fatigue. Pertinent negatives for primary symptoms include no fever, no shortness of breath, no cough, no wheezing, no palpitations and no abdominal pain.  Associated symptoms include weakness. She tried nothing for the symptoms. Risk factors include post-menopausal.  Her past medical history is significant for cancer and hyperlipidemia.     Past Medical History  Diagnosis Date  . Hiatal hernia   . Esophageal reflux   . Personal history of other disorders of nervous system and sense organs   . Other and unspecified hyperlipidemia   . Abdominal pain, epigastric   . Bladder cancer   . Flatulence, eructation, and gas pain   . Irritable bowel syndrome   . Pain in joint, site unspecified   . Other abnormal glucose   . Abnormality of gait   . Cervicalgia   . Lumbago   . Nonspecific elevation of levels of transaminase or lactic acid dehydrogenase (LDH)   . Other malaise and fatigue   . Anxiety state, unspecified   . Palpitations   . Other chest pain   .  Restless leg syndrome   . Other and unspecified hyperlipidemia   . Parkinson disease     Past Surgical History  Procedure Date  . Appendectomy   . Lumbar laminectomy     X 3  . Breast surgery   . Bunionectomy     left  . Trigger finger release     X2  . Bladder tumor excision   . Cesarean section     Family History  Problem Relation Age of Onset  . Lung cancer Father   . Colon cancer Mother   . Breast cancer Mother   . Stomach cancer Maternal Grandmother   . Breast cancer Maternal Aunt   . Prostate cancer Paternal Grandfather   . Prostate cancer      Paternal Elio Forget  . Prostate cancer Maternal Grandfather     History  Substance Use Topics  . Smoking status: Never Smoker   . Smokeless tobacco: Not on file  . Alcohol Use: No    OB History    Grav Para Term Preterm Abortions TAB SAB Ect Mult Living                  Review of Systems  Constitutional: Positive for fatigue. Negative for fever.  Respiratory: Negative for cough, shortness of breath and wheezing.   Cardiovascular: Positive for chest pain. Negative for palpitations.  Gastrointestinal: Negative for abdominal pain.  Musculoskeletal: Negative for back pain.  Skin: Negative for rash.  Neurological: Positive for tremors, weakness and light-headedness. Negative for syncope.    Allergies  Omeprazole; Simvastatin; and Venlafaxine  Home Medications   Current Outpatient Rx  Name Route Sig Dispense Refill  . ASPIRIN 81 MG PO TABS Oral Take 81 mg by mouth daily.      Marland Kitchen BENZONATATE 100 MG PO CAPS Oral Take 1 capsule (100 mg total) by mouth 3 (three) times daily as needed for cough. 30 capsule 0  . DIAZEPAM 5 MG PO TABS Oral Take 5 mg by mouth as needed.      Marland Kitchen METANX 2.8-25-2 MG PO TABS Oral Take 1 tablet by mouth daily.      . METHYLCELLULOSE (LAXATIVE) PO POWD Oral Take by mouth daily. Once daily     . POLYETHYLENE GLYCOL 3350 PO POWD Oral Take 17 g by mouth daily. Once daily     .  RABEPRAZOLE SODIUM 20 MG PO TBEC Oral Take 1 tablet (20 mg total) by mouth daily. 30 tablet 11  . ROPINIROLE HCL 12 MG PO TB24 Oral Take 1 tablet by mouth daily.      Marland Kitchen SOLIFENACIN SUCCINATE 5 MG PO TABS Oral Take 10 mg by mouth daily.      Marland Kitchen ZOLPIDEM TARTRATE 5 MG PO TABS Oral Take 5 mg by mouth at bedtime as needed.        BP 126/77  Pulse 85  Temp(Src) 98.4 F (36.9 C) (Oral)  Resp 18  SpO2 98%  Physical Exam  Nursing note and vitals reviewed. Constitutional: She is oriented to person, place, and time. She appears well-developed and well-nourished.  HENT:  Head: Normocephalic and atraumatic.  Eyes: Conjunctivae and EOM are normal. Pupils are equal, round, and reactive to light.  Neck: Normal range of motion. Neck supple.  Cardiovascular: Normal rate, regular rhythm, normal heart sounds and intact distal pulses.   No murmur heard. Pulmonary/Chest: Effort normal and breath sounds normal. No respiratory distress. She has no wheezes. She has no rales. She exhibits no tenderness.  Abdominal: Soft. Bowel sounds are normal. She exhibits no distension and no mass. There is no tenderness. There is no rebound and no guarding.  Musculoskeletal: Normal range of motion. She exhibits no edema and no tenderness.  Neurological: She is alert and oriented to person, place, and time.  Skin: Skin is warm and dry.  Psychiatric: She has a normal mood and affect. Her behavior is normal. Judgment and thought content normal.    ED Course  Procedures (including critical care time)  Labs Reviewed - No data to display No results found.   1. Chest pain     Date: 01/26/2011  Rate: 90   Rhythm: normal sinus rhythm  QRS Axis: RAD in first EKG, normal axis in EKG #2.   Intervals: normal  ST/T Wave abnormalities: TWI in I, aVL in ekg #1, resolved in EKG #2  Conduction Disutrbances:none  Narrative Interpretation:   Old EKG Reviewed: prev EKG normal    MDM  OTHER DATA REVIEWED: Nursing notes and  vital signs reviewed. Prior records reviewed. Has several RF for ACS including dysplipidemia, CA, postemopausal. intial EKG with RAD and TWI I, aVL but repeat EKG normal. VS acceptable. asxatic here. Transferring to ED. All questions answered, pt agreeable with plan to transfer.    Luiz Blare, MD 01/26/11 1731  Luiz Blare, MD 01/26/11 [phone]

## 2011-01-26 NOTE — ED Notes (Addendum)
Pt is here with complaints of Chest pain that occurred yesterday.  Pt describes the pain as shooting across her chest under her breast and up to her neck and face.  Duration approx 15-20 minutes.  Pain is now completely resolved.  Denies SOB or diaphoresis.  Pt does have history of acid reflux.

## 2011-01-26 NOTE — Telephone Encounter (Signed)
LMOM to inform patient. 

## 2011-01-26 NOTE — ED Provider Notes (Signed)
History     CSN: [id number]  Arrival date & time 01/26/11  1758   First MD Initiated Contact with Patient 01/26/11 2139      Chief Complaint  Patient presents with  . Chest Pain    (Consider location/radiation/quality/duration/timing/severity/associated sxs/prior treatment) Patient is a 64 y.o. female presenting with chest pain. The history is provided by the patient. No language interpreter was used.  Chest Pain The chest pain began yesterday. Duration of episode(s) is 20 minutes. Chest pain occurs rarely. The chest pain is resolved. Associated with: shopping. At its most intense, the pain is at 7/10. The pain is currently at 0/10. The severity of the pain is moderate. The quality of the pain is described as sharp. The pain radiates to the left neck. Pertinent negatives for primary symptoms include no fever, no fatigue, no syncope, no shortness of breath, no cough, no palpitations, no abdominal pain, no nausea, no vomiting and no altered mental status.  Pertinent negatives for associated symptoms include no lower extremity edema, no near-syncope, no numbness and no weakness. She tried nothing for the symptoms. Risk factors include no known risk factors.     Past Medical History  Diagnosis Date  . Hiatal hernia   . Esophageal reflux   . Personal history of other disorders of nervous system and sense organs   . Other and unspecified hyperlipidemia   . Abdominal pain, epigastric   . Bladder cancer   . Flatulence, eructation, and gas pain   . Irritable bowel syndrome   . Pain in joint, site unspecified   . Other abnormal glucose   . Abnormality of gait   . Cervicalgia   . Lumbago   . Nonspecific elevation of levels of transaminase or lactic acid dehydrogenase (LDH)   . Other malaise and fatigue   . Anxiety state, unspecified   . Palpitations   . Other chest pain   . Restless leg syndrome   . Other and unspecified hyperlipidemia   . Parkinson disease     Past Surgical  History  Procedure Date  . Appendectomy   . Lumbar laminectomy     X 3  . Breast surgery   . Bunionectomy     left  . Trigger finger release     X2  . Bladder tumor excision   . Cesarean section     Family History  Problem Relation Age of Onset  . Lung cancer Father   . Colon cancer Mother   . Breast cancer Mother   . Stomach cancer Maternal Grandmother   . Breast cancer Maternal Aunt   . Prostate cancer Paternal Grandfather   . Prostate cancer      Paternal Elio Forget  . Prostate cancer Maternal Grandfather     History  Substance Use Topics  . Smoking status: Never Smoker   . Smokeless tobacco: Not on file  . Alcohol Use: No    OB History    Grav Para Term Preterm Abortions TAB SAB Ect Mult Living                  Review of Systems  Constitutional: Negative for fever and fatigue.  Respiratory: Negative for cough and shortness of breath.   Cardiovascular: Positive for chest pain. Negative for palpitations, syncope and near-syncope.  Gastrointestinal: Negative for nausea, vomiting and abdominal pain.  Neurological: Negative for weakness and numbness.  Psychiatric/Behavioral: Negative for altered mental status.  All other systems reviewed and are negative.  Allergies  Omeprazole; Simvastatin; and Venlafaxine  Home Medications   Current Outpatient Rx  Name Route Sig Dispense Refill  . ASPIRIN 81 MG PO TABS Oral Take 81 mg by mouth daily.      Marland Kitchen DIAZEPAM 5 MG PO TABS Oral Take 5 mg by mouth every 8 (eight) hours as needed. For back spasms    . METHYLCELLULOSE (LAXATIVE) PO POWD Oral Take by mouth daily. Once daily     . POLYETHYLENE GLYCOL 3350 PO POWD Oral Take 17 g by mouth daily. Once daily     . RABEPRAZOLE SODIUM 20 MG PO TBEC Oral Take 1 tablet (20 mg total) by mouth daily. 30 tablet 11  . ROPINIROLE HCL 12 MG PO TB24 Oral Take 1 tablet by mouth daily.      Marland Kitchen SOLIFENACIN SUCCINATE 5 MG PO TABS Oral Take 10 mg by mouth daily.      Marland Kitchen ZOLPIDEM  TARTRATE 5 MG PO TABS Oral Take 5 mg by mouth at bedtime as needed. insomnia      BP 122/79  Pulse 116  Temp(Src) 97.9 F (36.6 C) (Oral)  Resp 20  SpO2 98%  Physical Exam  Nursing note and vitals reviewed. Constitutional: She is oriented to person, place, and time. She appears well-developed and well-nourished. No distress.  HENT:  Head: Normocephalic and atraumatic.  Eyes: EOM are normal. Pupils are equal, round, and reactive to light.  Neck: Normal range of motion. Neck supple.  Cardiovascular: Normal rate and regular rhythm.  Exam reveals no friction rub.   No murmur heard. Pulmonary/Chest: Effort normal and breath sounds normal. No respiratory distress. She has no wheezes. She has no rales.  Abdominal: Soft. She exhibits no distension. There is no tenderness. There is no rebound.  Musculoskeletal: Normal range of motion. She exhibits no edema.  Neurological: She is alert and oriented to person, place, and time.  Skin: She is not diaphoretic.    ED Course  Procedures (including critical care time)  Labs Reviewed  BASIC METABOLIC PANEL - Abnormal; Notable for the following:    Glucose, Bld 100 (*)    GFR calc non Af Amer 73 (*)    GFR calc Af Amer 85 (*)    All other components within normal limits  CBC  POCT I-STAT TROPONIN I  I-STAT TROPONIN I   Dg Chest 2 View  01/26/2011  *RADIOLOGY REPORT*  Clinical Data: Chest pain, cough, congestion.  CHEST - 2 VIEW  Comparison: None  Findings: Left basilar atelectasis.  Right lung is clear.  Heart is normal size.  No effusions or acute bony abnormality.  IMPRESSION: Left base atelectasis.  Original Report Authenticated By: Cyndie Chime, M.D.     1. Chest pain      EKG with normal sinus rhythm, no signs of acute ischemia. MDM  55F p/w chest pain episode that happened yesterday. No chest pain since. Had 15-20 minute episode that was sharp, substernal, and radiated to L neck/jaw. Onset while shopping. No SOB, N/V,  dizziness, double vision. Mild relief with sitting down. Denies recent DOE, orthopnea, focal weakness. Had similar chest pain once roughly six months ago and had a remote stress test. No hx of cardiac disease. Recent diagnoses of bladder cancer and Parkinson's within last year. Benign exam. EKG with normal sinus rhythm, no concern for acute ischemia. Troponin negative. With one episode and none since, one troponin sufficient. No concern for ongoing ischemia/ACS. Instructed to f/u with PCP for outpatient stress test  scheduling. Discharged home in stable condition.       Elwin Mocha, MD 01/27/11 [phone]

## 2011-01-27 ENCOUNTER — Telehealth: Payer: Self-pay | Admitting: Internal Medicine

## 2011-01-27 NOTE — Telephone Encounter (Signed)
Patient was advised to go to ed [id number] - ed doc suggested stress test - fyi to consider  patient has parkinsons - patients cell [phone]

## 2011-01-27 NOTE — Telephone Encounter (Signed)
ED note on the system please review and advise    KP

## 2011-01-28 ENCOUNTER — Other Ambulatory Visit: Payer: Self-pay | Admitting: Internal Medicine

## 2011-01-28 DIAGNOSIS — G20 Parkinson's disease: Secondary | ICD-10-CM

## 2011-01-28 DIAGNOSIS — R0789 Other chest pain: Secondary | ICD-10-CM

## 2011-01-28 NOTE — Telephone Encounter (Signed)
Recommend Cardiology consult ; she will probably need a Nuclear Rest Stress Test (non exercise)

## 2011-01-30 NOTE — Telephone Encounter (Signed)
Left message on cell informing patient of Dr.Hopper's recommendation

## 2011-02-02 NOTE — ED Provider Notes (Signed)
I saw and evaluated the patient, reviewed the resident's note and I agree with the findings and plan.  EKG:  Rhythm: normal sinus Rate: 83 Axis: normal Intervals:normal ST segments: normal  65yF with CP. 15 minute episode last night. Asymptomatic since. EKG non concerning and trop wnl. Non focal exam. Feel pt can be safely discharged at this time and v low risk for acs. outpt fu for stress testing.   Raeford Razor, MD 02/02/11 [phone]

## 2011-02-22 ENCOUNTER — Institutional Professional Consult (permissible substitution): Payer: 59 | Admitting: Cardiovascular Disease

## 2011-03-14 ENCOUNTER — Ambulatory Visit (INDEPENDENT_AMBULATORY_CARE_PROVIDER_SITE_OTHER): Payer: PRIVATE HEALTH INSURANCE | Admitting: Cardiovascular Disease

## 2011-03-14 ENCOUNTER — Encounter: Payer: Self-pay | Admitting: Cardiovascular Disease

## 2011-03-14 DIAGNOSIS — R079 Chest pain, unspecified: Secondary | ICD-10-CM

## 2011-03-14 DIAGNOSIS — K219 Gastro-esophageal reflux disease without esophagitis: Secondary | ICD-10-CM

## 2011-03-14 DIAGNOSIS — R269 Unspecified abnormalities of gait and mobility: Secondary | ICD-10-CM

## 2011-03-14 DIAGNOSIS — R0789 Other chest pain: Secondary | ICD-10-CM

## 2011-03-14 DIAGNOSIS — E785 Hyperlipidemia, unspecified: Secondary | ICD-10-CM

## 2011-03-14 NOTE — Progress Notes (Signed)
66 yo ref by [employer].  Bad episode of SSCP 12/12.  Lasted an hour or so.  Central pressure with diaphoresis.  History of GERD with multiple endoscopies but this felt different and was not associated with food.  Did not recur.  ECG in [facility] office 12/20 normal. Indicates normal stress test over 5 years ago.  Had a bad year last year with bladder cancer and Dx Parkinsons  Cancer Rx and prognosis fine  Sees Barron Alvine.  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 Difficulty walking.    ROS: Denies fever, malais, weight loss, blurry vision, decreased visual acuity, cough, sputum, SOB, hemoptysis, pleuritic pain, palpitaitons, heartburn, abdominal pain, melena, lower extremity edema, claudication, or rash.  All other systems reviewed and negative   General: Affect appropriate Healthy:  appears stated age HEENT: normal Neck supple with no adenopathy JVP normal no bruits no thyromegaly Lungs clear with no wheezing and good diaphragmatic motion Heart:  S1/S2 no murmur,rub, gallop or click PMI normal Abdomen: benighn, BS positve, no tenderness, no AAA no bruit.  No HSM or HJR Distal pulses intact with no bruits No edema Neuro non-focal Skin warm and dry No muscular weakness Unsteady gait  Medications Current Outpatient Prescriptions  Medication Sig Dispense Refill  . aspirin 81 MG tablet Take 81 mg by mouth daily.        . diazepam (VALIUM) 5 MG tablet Take 5 mg by mouth every 8 (eight) hours as needed. For back spasms      . methylcellulose (CITRUCEL) oral powder Take by mouth daily. Once daily       . polyethylene glycol powder (MIRALAX) powder Take 17 g by mouth daily. Once daily       . RABEprazole (ACIPHEX) 20 MG tablet Take 1 tablet (20 mg total) by mouth daily.  30 tablet  11  . Ropinirole HCl (REQUIP XL) 12 MG TB24 Take 1 tablet by mouth daily.        . solifenacin (VESICARE) 5 MG tablet Take 5 mg by mouth daily.       Marland Kitchen zolpidem (AMBIEN) 5  MG tablet Take 5 mg by mouth at bedtime as needed. insomnia        Allergies Omeprazole; Simvastatin; and Venlafaxine  Family History: Family History  Problem Relation Age of Onset  . Lung cancer Father   . Colon cancer Mother   . Breast cancer Mother   . Stomach cancer Maternal Grandmother   . Breast cancer Maternal Aunt   . Prostate cancer Paternal Grandfather   . Prostate cancer      Paternal Elio Forget  . Prostate cancer Maternal Grandfather     Social History: History   Social History  . Marital Status: Married    Spouse Name: N/A    Number of Children: 1  . Years of Education: N/A   Occupational History  . volunteer     [employer]   Social History Main Topics  . Smoking status: Never Smoker   . Smokeless tobacco: Not on file  . Alcohol Use: No  . Drug Use: No  . Sexually Active: Not on file   Other Topics Concern  . Not on file   Social History Narrative  . No narrative on file    Electrocardiogram:  Assessment and Plan

## 2011-03-14 NOTE — Assessment & Plan Note (Signed)
Multiple prev endoscopies May be source of SSCP  F/U GI and continue proton pump inhibitor

## 2011-03-14 NOTE — Assessment & Plan Note (Signed)
Cholesterol is at goal.  Continue current dose of statin and diet Rx.  No myalgias or side effects.  F/U  LFT's in 6 months. Lab Results  Component Value Date   LDLCALC 122* 07/01/2008

## 2011-03-14 NOTE — Assessment & Plan Note (Signed)
Severe episode of pain.  ECG ok  Unable to walk due to back pain and Parkinsons  F/U Lexiscan myovue

## 2011-03-14 NOTE — Patient Instructions (Signed)
Your physician recommends that you schedule a follow-up appointment in: AS NEEDED Your physician recommends that you continue on your current medications as directed. Please refer to the Current Medication list given to you today. Your physician has requested that you have a lexiscan myoview. For further information please visit [email or web address]. Please follow instruction sheet, as given. DX CHEST PAIN  

## 2011-03-14 NOTE — Assessment & Plan Note (Signed)
Continue Parkinson meds and F/U with neuro

## 2011-03-27 ENCOUNTER — Ambulatory Visit (HOSPITAL_COMMUNITY): Payer: PRIVATE HEALTH INSURANCE | Attending: Cardiovascular Disease | Admitting: Radiology

## 2011-03-27 DIAGNOSIS — R61 Generalized hyperhidrosis: Secondary | ICD-10-CM | POA: Insufficient documentation

## 2011-03-27 DIAGNOSIS — R5381 Other malaise: Secondary | ICD-10-CM | POA: Insufficient documentation

## 2011-03-27 DIAGNOSIS — R079 Chest pain, unspecified: Secondary | ICD-10-CM | POA: Insufficient documentation

## 2011-03-27 DIAGNOSIS — E785 Hyperlipidemia, unspecified: Secondary | ICD-10-CM | POA: Insufficient documentation

## 2011-03-27 DIAGNOSIS — R5383 Other fatigue: Secondary | ICD-10-CM | POA: Insufficient documentation

## 2011-03-27 MED ORDER — REGADENOSON 0.4 MG/5ML IV SOLN
0.4000 mg | Freq: Once | INTRAVENOUS | Status: AC
  Administered 2011-03-27: 0.4 mg via INTRAVENOUS

## 2011-03-27 MED ORDER — TECHNETIUM TC 99M TETROFOSMIN IV KIT
30.0000 | PACK | Freq: Once | INTRAVENOUS | Status: AC | PRN
  Administered 2011-03-27: 30 via INTRAVENOUS

## 2011-03-27 MED ORDER — TECHNETIUM TC 99M TETROFOSMIN IV KIT
10.0000 | PACK | Freq: Once | INTRAVENOUS | Status: AC | PRN
  Administered 2011-03-27: 10 via INTRAVENOUS

## 2011-03-27 NOTE — Progress Notes (Signed)
[facility] SITE 3 NUCLEAR MED [address] [place] [address] [phone]  Cardiology Nuclear Med Study  Kristina Carr is a 66 y.o. female [id number] September 04, 1945   Nuclear Med Background Indication for Stress Test:  Evaluation for Ischemia History: 2007 Myocardial Perfusion Study: (-) ischemia EF: 68% Cardiac Risk Factors: Lipids  Symptoms:  Chest Pain, Diaphoresis and Fatigue   Nuclear Pre-Procedure Caffeine/Decaff Intake:  None NPO After: 8:30pm   Lungs:  clear IV 0.9% NS with Angio Cath:  20g  IV Site: R Antecubital  IV Started by:  Stanton Kidney, EMT-P  Chest Size (in):  38 Cup Size: DDD  Height: 5' 4.25" (1.632 m)  Weight:  173 lb (78.472 kg)  BMI:  Body mass index is 29.46 kg/(m^2). Tech Comments: The patient attempted to walk the treadmill but was unable to keep up with it and became very fatigued.    Nuclear Med Study 1 or 2 day study: 1 day  Stress Test Type:  Treadmill/Lexiscan  Reading MD: Marca Ancona, MD  Order Authorizing Provider:  P.Nishan  Resting Radionuclide: Technetium 80m Tetrofosmin  Resting Radionuclide Dose: 11.0 mCi   Stress Radionuclide:  Technetium 39m Tetrofosmin  Stress Radionuclide Dose: 33.0 mCi           Stress Protocol Rest HR: 72 Stress HR: 116  Rest BP: 114/87 Stress BP: 123/57  Exercise Time (min): n/a METS: n/a   Predicted Max HR: 157 bpm % Max HR: 73.89 bpm Rate Pressure Product: 28413   Dose of Adenosine (mg):  n/a Dose of Lexiscan: 0.4 mg  Dose of Atropine (mg): n/a Dose of Dobutamine: n/a mcg/kg/min (at max HR)  Stress Test Technologist: Milana Na, EMT-P  Nuclear Technologist:  Doyne Keel, CNMT     Rest Procedure:  Myocardial perfusion imaging was performed at rest 45 minutes following the intravenous administration of Technetium 57m Tetrofosmin. Rest ECG: NSR  Stress Procedure:  The patient received IV Lexiscan 0.4 mg over 15-seconds with concurrent low level exercise and then Technetium  25m Tetrofosmin was injected at 30-seconds while the patient continued walking one more minute.  There were no significant changes, weakness, and light headed with Lexiscan.  Quantitative spect images were obtained after a 45-minute delay. Stress ECG: No significant change from baseline ECG  QPS Raw Data Images:  There is a breast shadow that accounts for the anterior attenuation. Stress Images:  Small apical anterior perfusion defect.  Rest Images:  Small apical anterior perfusion defect.  Subtraction (SDS):  Small fixed apical anterior perfusion defect.  Transient Ischemic Dilatation (Normal <1.22):  1.08 Lung/Heart Ratio (Normal <0.45):  0.34  Quantitative Gated Spect Images QGS EDV:  82 ml QGS ESV:  20 ml QGS cine images:  NL LV Function; NL Wall Motion QGS EF: 76%  Impression Exercise Capacity:  Lexiscan with no exercise. BP Response:  Normal blood pressure response. Clinical Symptoms:  Dizzy, weak ECG Impression:  No significant ST segment change suggestive of ischemia. Comparison with Prior Nuclear Study: No significant change from previous study  Overall Impression:  Normal stress nuclear study.  There is a small fixed apical anterior perfusion defect that I suspect is breast attenuation (prominent shadow).   Arno Cullers [employer]

## 2011-03-29 ENCOUNTER — Other Ambulatory Visit: Payer: Self-pay | Admitting: Gastroenterology

## 2011-03-29 MED ORDER — RABEPRAZOLE SODIUM 20 MG PO TBEC: 20.0000 mg | DELAYED_RELEASE_TABLET | Freq: Every day | ORAL | Status: DC

## 2011-03-29 NOTE — Telephone Encounter (Signed)
rx sent, left message for pt

## 2011-04-17 ENCOUNTER — Telehealth: Payer: Self-pay | Admitting: Gastroenterology

## 2011-04-17 MED ORDER — PANTOPRAZOLE SODIUM 40 MG PO TBEC: 40.0000 mg | DELAYED_RELEASE_TABLET | Freq: Every day | ORAL | Status: DC

## 2011-04-17 NOTE — Telephone Encounter (Signed)
Pt needs a generic PPI so her options are Protonix and Prevacid generic I have sent generic protonix.

## 2011-05-15 ENCOUNTER — Other Ambulatory Visit: Payer: Self-pay | Admitting: Obstetrics and Gynecology

## 2011-05-15 NOTE — Telephone Encounter (Signed)
Hard copy chart will be on your table for review if needed.

## 2011-05-15 NOTE — Telephone Encounter (Signed)
Refill called to pharmacy.

## 2011-05-16 ENCOUNTER — Encounter (HOSPITAL_COMMUNITY): Payer: Self-pay

## 2011-05-16 ENCOUNTER — Emergency Department (HOSPITAL_COMMUNITY)
Admission: EM | Admit: 2011-05-16 | Discharge: 2011-05-16 | Disposition: A | Discharge status: Home / Self Care | Payer: PRIVATE HEALTH INSURANCE | Attending: Emergency Medicine | Admitting: Emergency Medicine

## 2011-05-16 ENCOUNTER — Emergency Department (HOSPITAL_COMMUNITY): Payer: PRIVATE HEALTH INSURANCE

## 2011-05-16 ENCOUNTER — Other Ambulatory Visit: Payer: Self-pay

## 2011-05-16 DIAGNOSIS — E785 Hyperlipidemia, unspecified: Secondary | ICD-10-CM | POA: Insufficient documentation

## 2011-05-16 DIAGNOSIS — R079 Chest pain, unspecified: Secondary | ICD-10-CM | POA: Insufficient documentation

## 2011-05-16 DIAGNOSIS — R110 Nausea: Secondary | ICD-10-CM | POA: Insufficient documentation

## 2011-05-16 DIAGNOSIS — Z8551 Personal history of malignant neoplasm of bladder: Secondary | ICD-10-CM | POA: Insufficient documentation

## 2011-05-16 DIAGNOSIS — R0602 Shortness of breath: Secondary | ICD-10-CM | POA: Insufficient documentation

## 2011-05-16 DIAGNOSIS — Z79899 Other long term (current) drug therapy: Secondary | ICD-10-CM | POA: Insufficient documentation

## 2011-05-16 DIAGNOSIS — R61 Generalized hyperhidrosis: Secondary | ICD-10-CM | POA: Insufficient documentation

## 2011-05-16 DIAGNOSIS — G20A1 Parkinson's disease without dyskinesia, without mention of fluctuations: Secondary | ICD-10-CM | POA: Insufficient documentation

## 2011-05-16 DIAGNOSIS — K219 Gastro-esophageal reflux disease without esophagitis: Secondary | ICD-10-CM | POA: Insufficient documentation

## 2011-05-16 DIAGNOSIS — K589 Irritable bowel syndrome without diarrhea: Secondary | ICD-10-CM | POA: Insufficient documentation

## 2011-05-16 DIAGNOSIS — G20 Parkinson's disease: Secondary | ICD-10-CM | POA: Insufficient documentation

## 2011-05-16 DIAGNOSIS — Z7982 Long term (current) use of aspirin: Secondary | ICD-10-CM | POA: Insufficient documentation

## 2011-05-16 LAB — CBC
HCT: 44 % (ref 36.0–46.0)
MCH: 30.1 pg (ref 26.0–34.0)
MCHC: 33.6 g/dL (ref 30.0–36.0)
MCV: 89.4 fL (ref 78.0–100.0)
RDW: 13.3 % (ref 11.5–15.5)

## 2011-05-16 LAB — BASIC METABOLIC PANEL
BUN: 16 mg/dL (ref 6–23)
Creatinine, Ser: 0.83 mg/dL (ref 0.50–1.10)
GFR calc Af Amer: 85 mL/min — ABNORMAL LOW (ref >90)
GFR calc non Af Amer: 73 mL/min — ABNORMAL LOW (ref >90)

## 2011-05-16 LAB — POCT I-STAT TROPONIN I: Troponin i, poc: 0 ng/mL (ref 0.00–0.08)

## 2011-05-16 MED ORDER — ASPIRIN 81 MG PO CHEW
324.0000 mg | CHEWABLE_TABLET | Freq: Once | ORAL | Status: AC
Start: 2011-05-16 — End: 2011-05-16
  Administered 2011-05-16: 324 mg via ORAL
  Filled 2011-05-16: qty 4

## 2011-05-16 NOTE — ED Notes (Signed)
Pt states pain on the left side of chest states some sob with nausea pt also states some right side jaw pain

## 2011-05-16 NOTE — ED Notes (Signed)
Patient transported to X-ray 

## 2011-05-16 NOTE — ED Notes (Signed)
Pt presents with no acute distress.  Pt reports similar episode 2 months ago and stress test was performed by [facility].  Pt reports chest pressure that was constant 30 minutes ago epigastric to mid chest- denies SOB and chest pain at present.  Pt reports pain to left jaw/tingling- cardiac monitor and pulse ox and oxygen upon arrival

## 2011-05-16 NOTE — ED Notes (Signed)
EKG given to Dr. Ghim 

## 2011-05-16 NOTE — ED Provider Notes (Signed)
History     CSN: [id number]  Arrival date & time 05/16/11  1107   First MD Initiated Contact with Patient 05/16/11 1226      Chief Complaint  Patient presents with  . Chest Pain    x    (Consider location/radiation/quality/duration/timing/severity/associated sxs/prior treatment) HPI Patient is a 66 yo female who presents for evaluation of 10/10 substernal chest pain radiating to the left shoulder than began while attending a meeting and lasted 30 minutes.  This resolved without intervention and the patient was assympromatic upon arrival.  She has not taken ASA.  Patient has had episodes similar to this in the past and has had negative stress test within the past 2 months.  She denies cough, fever, or GERD symptoms.  She did become diaphoretic and nauseas with it.  Patient has no history of DVT or PE. There are no other associated or modifying factors.  Past Medical History  Diagnosis Date  . Hiatal hernia   . Esophageal reflux   . Personal history of other disorders of nervous system and sense organs   . Other and unspecified hyperlipidemia   . Abdominal pain, epigastric   . Bladder cancer   . Flatulence, eructation, and gas pain   . Irritable bowel syndrome   . Pain in joint, site unspecified   . Other abnormal glucose   . Abnormality of gait   . Cervicalgia   . Lumbago   . Nonspecific elevation of levels of transaminase or lactic acid dehydrogenase (LDH)   . Other malaise and fatigue   . Anxiety state, unspecified   . Palpitations   . Other chest pain   . Restless leg syndrome   . Other and unspecified hyperlipidemia   . Parkinson disease   . Parkinson's disease     Past Surgical History  Procedure Date  . Appendectomy   . Lumbar laminectomy     X 3  . Breast surgery   . Bunionectomy     left  . Trigger finger release     X2  . Bladder tumor excision   . Cesarean section     Family History  Problem Relation Age of Onset  . Lung cancer Father   .  Colon cancer Mother   . Breast cancer Mother   . Stomach cancer Maternal Grandmother   . Breast cancer Maternal Aunt   . Prostate cancer Paternal Grandfather   . Prostate cancer      Paternal Elio Forget  . Prostate cancer Maternal Grandfather     History  Substance Use Topics  . Smoking status: Never Smoker   . Smokeless tobacco: Not on file  . Alcohol Use: No    OB History    Grav Para Term Preterm Abortions TAB SAB Ect Mult Living                  Review of Systems  Constitutional: Positive for diaphoresis.  HENT: Negative.   Eyes: Negative.   Respiratory: Positive for shortness of breath.   Cardiovascular: Positive for chest pain.  Gastrointestinal: Negative.   Genitourinary: Negative.   Musculoskeletal: Negative.   Neurological: Negative.   Hematological: Negative.   Psychiatric/Behavioral: Negative.   All other systems reviewed and are negative.    Allergies  Omeprazole; Simvastatin; and Venlafaxine  Home Medications   Current Outpatient Rx  Name Route Sig Dispense Refill  . ASPIRIN 81 MG PO TABS Oral Take 81 mg by mouth daily.      Marland Kitchen  ESCITALOPRAM OXALATE 10 MG PO TABS Oral Take 10 mg by mouth daily.    . OXYBUTYNIN CHLORIDE ER 10 MG PO TB24 Oral Take 10 mg by mouth daily.    Marland Kitchen PANTOPRAZOLE SODIUM 40 MG PO TBEC Oral Take 1 tablet (40 mg total) by mouth daily. 30 tablet 11  . ROPINIROLE HCL ER 12 MG PO TB24 Oral Take 1 tablet by mouth daily.      Marland Kitchen ZOLPIDEM TARTRATE 10 MG PO TABS        BP 120/70  Pulse 78  Temp(Src) 98.5 F (36.9 C) (Oral)  Resp 17  SpO2 95%  Physical Exam  Nursing note and vitals reviewed. GEN: Well-developed, well-nourished female in no distress HEENT: Atraumatic, normocephalic. Oropharynx clear without erythema EYES: PERRLA BL, no scleral icterus. NECK: Trachea midline, no meningismus CV: regular rate and rhythm. No murmurs, rubs, or gallops PULM: No respiratory distress.  No crackles, wheezes, or rales. GI: soft,  non-tender. No guarding, rebound, or tenderness. + bowel sounds  GU: deferred Neuro: cranial nerves 2-12 intact, no abnormalities of strength or sensation, A and O x 3 MSK: Patient moves all 4 extremities symmetrically, no deformity, edema, or injury noted Skin: No rashes petechiae, purpura, or jaundice Psych: no abnormality of mood   ED Course  Procedures (including critical care time)   Date: 05/16/2011  Rate: 83  Rhythm: normal sinus rhythm  QRS Axis: normal  Intervals: normal  ST/T Wave abnormalities: nonspecific T wave changes  Conduction Disutrbances:none  Narrative Interpretation:   Old EKG Reviewed: unchanged    Labs Reviewed  BASIC METABOLIC PANEL - Abnormal; Notable for the following:    Glucose, Bld 101 (*)    GFR calc non Af Amer 73 (*)    GFR calc Af Amer 85 (*)    All other components within normal limits  CBC  PRO B NATRIURETIC PEPTIDE  POCT I-STAT TROPONIN I  POCT I-STAT TROPONIN I   Dg Chest 2 View  05/16/2011  *RADIOLOGY REPORT*  Clinical Data: Mid chest pain.  CHEST - 2 VIEW  Comparison: Chest radiograph 01/26/2011  Findings: The heart, mediastinal, and hilar contours are normal. Subsegmental linear atelectasis at the left lung base.  The right lung is clear.  Negative for pleural effusion or pneumothorax. Degenerative changes of the left shoulder.  IMPRESSION: Minimal left basilar atelectasis.  Otherwise, no acute findings.  Original Report Authenticated By: Britta Mccreedy, M.D.     1. Chest pain       MDM  Patient was seen and was asymptomatic.  She had work-up for her chest pain with no significant findings.  TNI was negative at 0 and 3 hours.  Patient received ASA while awaiting completion of her work-up.  The patient remained asymptomatic. She was advised to follow-up with her PCP and told that she is welcome to return if she has return of her symptoms or any other emergent concerns.  She was discharged in good condition and comfortable with plan for  discharge.         Cyndra Numbers, MD 05/17/11 [phone]

## 2011-05-16 NOTE — Discharge Instructions (Signed)
Chest Pain (Nonspecific) It is often hard to give a specific diagnosis for the cause of chest pain. There is always a chance that your pain could be related to something serious, such as a heart attack or a blood clot in the lungs. You need to follow up with your caregiver for further evaluation. CAUSES   Heartburn.   Pneumonia or bronchitis.   Anxiety or stress.   Inflammation around your heart (pericarditis) or lung (pleuritis or pleurisy).   A blood clot in the lung.   A collapsed lung (pneumothorax). It can develop suddenly on its own (spontaneous pneumothorax) or from injury (trauma) to the chest.   Shingles infection (herpes zoster virus).  The chest wall is composed of bones, muscles, and cartilage. Any of these can be the source of the pain.  The bones can be bruised by injury.   The muscles or cartilage can be strained by coughing or overwork.   The cartilage can be affected by inflammation and become sore (costochondritis).  DIAGNOSIS  Lab tests or other studies, such as X-rays, electrocardiography, stress testing, or cardiac imaging, may be needed to find the cause of your pain.  TREATMENT   Treatment depends on what may be causing your chest pain. Treatment may include:   Acid blockers for heartburn.   Anti-inflammatory medicine.   Pain medicine for inflammatory conditions.   Antibiotics if an infection is present.   You may be advised to change lifestyle habits. This includes stopping smoking and avoiding alcohol, caffeine, and chocolate.   You may be advised to keep your head raised (elevated) when sleeping. This reduces the chance of acid going backward from your stomach into your esophagus.   Most of the time, nonspecific chest pain will improve within 2 to 3 days with rest and mild pain medicine.  HOME CARE INSTRUCTIONS   If antibiotics were prescribed, take your antibiotics as directed. Finish them even if you start to feel better.   For the next few  days, avoid physical activities that bring on chest pain. Continue physical activities as directed.   Do not smoke.   Avoid drinking alcohol.   Only take over-the-counter or prescription medicine for pain, discomfort, or fever as directed by your caregiver.   Follow your caregiver's suggestions for further testing if your chest pain does not go away.   Keep any follow-up appointments you made. If you do not go to an appointment, you could develop lasting (chronic) problems with pain. If there is any problem keeping an appointment, you must call to reschedule.  SEEK MEDICAL CARE IF:   You think you are having problems from the medicine you are taking. Read your medicine instructions carefully.   Your chest pain does not go away, even after treatment.   You develop a rash with blisters on your chest.  SEEK IMMEDIATE MEDICAL CARE IF:   You have increased chest pain or pain that spreads to your arm, neck, jaw, back, or abdomen.   You develop shortness of breath, an increasing cough, or you are coughing up blood.   You have severe back or abdominal pain, feel nauseous, or vomit.   You develop severe weakness, fainting, or chills.   You have a fever.  THIS IS AN EMERGENCY. Do not wait to see if the pain will go away. Get medical help at once. Call your local emergency services (911 in [place] Do not drive yourself to the hospital. MAKE SURE YOU:   Understand these instructions.     Will watch your condition.   Will get help right away if you are not doing well or get worse.  Document Released: 11/02/2004 Document Revised: 01/12/2011 Document Reviewed: 08/29/2007 Exit[employer] Patient Information 2012 [employer]. 

## 2011-07-04 ENCOUNTER — Other Ambulatory Visit: Payer: Self-pay | Admitting: Obstetrics and Gynecology

## 2011-07-05 NOTE — Telephone Encounter (Signed)
rx called in

## 2011-07-25 ENCOUNTER — Telehealth: Payer: Self-pay | Admitting: Internal Medicine

## 2011-07-25 ENCOUNTER — Ambulatory Visit (INDEPENDENT_AMBULATORY_CARE_PROVIDER_SITE_OTHER): Payer: PRIVATE HEALTH INSURANCE | Admitting: Internal Medicine

## 2011-07-25 ENCOUNTER — Ambulatory Visit (INDEPENDENT_AMBULATORY_CARE_PROVIDER_SITE_OTHER)
Admission: RE | Admit: 2011-07-25 | Discharge: 2011-07-25 | Disposition: A | Discharge status: Home / Self Care | Payer: PRIVATE HEALTH INSURANCE | Source: Ambulatory Visit | Attending: Internal Medicine | Admitting: Internal Medicine

## 2011-07-25 VITALS — BP 126/80 | HR 79 | Temp 98.1°F | Wt 172.0 lb

## 2011-07-25 DIAGNOSIS — R05 Cough: Secondary | ICD-10-CM

## 2011-07-25 MED ORDER — HYDROCODONE-HOMATROPINE 5-1.5 MG/5ML PO SYRP: 5.0000 mL | ORAL_SOLUTION | Freq: Three times a day (TID) | ORAL | Status: AC | PRN

## 2011-07-25 MED ORDER — DOXYCYCLINE HYCLATE 100 MG PO TABS: 100.0000 mg | ORAL_TABLET | Freq: Two times a day (BID) | ORAL | Status: AC

## 2011-07-25 MED ORDER — ALBUTEROL SULFATE HFA 108 (90 BASE) MCG/ACT IN AERS: 2.0000 | INHALATION_SPRAY | Freq: Four times a day (QID) | RESPIRATORY_TRACT | Status: DC | PRN

## 2011-07-25 NOTE — Telephone Encounter (Signed)
Caller: Kristina Carr/Patient; PCP: Marga Melnick; CB#: [phone]; Call regarding Cough/Appt requested.  Cough for "several weeks" that has become more frequent over the past 4 d.  Emergent sx r/o. Appt sched for 1315 today with Dr. Drue Novel.

## 2011-07-25 NOTE — Progress Notes (Signed)
  Subjective:    Patient ID: Kristina Carr, female    DOB: [birth year], 66 y.o.   MRN: [id number]  HPI Acute visit 3 weeks history of cough, chest congestion, symptoms worse for the last 3 days. Sputum is thick and white. Has not taken any medications for these. Chart reviewed, no history of COPD, asthma. She's not a smoker.  Past Medical History  Diagnosis Date  . Hiatal hernia   . Esophageal reflux   . Personal history of other disorders of nervous system and sense organs   . Other and unspecified hyperlipidemia   . Abdominal pain, chronic, epigastric   . Bladder cancer   . Flatulence, eructation, and gas pain   . Irritable bowel syndrome   . Pain in joint, site unspecified   . Other abnormal glucose   . Abnormality of gait   . Cervicalgia   . Lumbago   . Elevated aspartate aminotransferase level   . Other malaise and fatigue   . Anxiety state, unspecified   . Palpitations   . Other chest pain   . Restless leg syndrome   . Other and unspecified hyperlipidemia   . Parkinson disease   . Parkinson's disease      Review of Systems No fever or chills No chest pain or shortness of breath No sinus congestion or pain, no ear discomfort. GERD symptoms well controlled as long as she takes PPIs.     Objective:   Physical Exam General -- alert, well-developed. No apparent distress.  HEENT -- TMs normal, throat w/o redness, face symmetric and not tender to palpation, nose not congested  Lungs -- normal respiratory effort, no intercostal retractions, no accessory muscle use, rhonchi bilaterally, more noticeable at the bases. Few end expiratory wheezing. No increased work of breathing.  Heart-- normal rate, regular rhythm, no murmur, and no gallop.   Extremities-- no pretibial edema bilaterally  Psych-- Cognition and judgment appear intact. Alert and cooperative with normal attention span and concentration.  not anxious appearing and not depressed appearing.       Assessment  & Plan:   Cough, 3 weeks history of cough, she has rhonchi at the bases and some wheezing. Probably bronchitis and mild bronchospasm but also like to rule out pneumonia. Plan: Antibiotics, hydrocodone, Ventolin, chest x-ray. See instructions

## 2011-07-25 NOTE — Patient Instructions (Addendum)
Please get your x-ray at the other [facility]  office located at: [address] [place], across from [facility].  Please go to the basement, this is a walk-in facility, they are open from 8:30 to 5:30 PM. Phone number [phone]. --- Rest, fluids , tylenol For cough, take Mucinex DM twice a day as needed  If the cough continue, use hydrocodone (will cause drowsiness) If wheezing-- albuterol as needed  Take the antibiotic as prescribed  (doxycycline) Call if no better in few days Call anytime if the symptoms are severe

## 2011-07-26 ENCOUNTER — Encounter: Payer: Self-pay | Admitting: Internal Medicine

## 2011-07-26 ENCOUNTER — Telehealth: Payer: Self-pay | Admitting: *Deleted

## 2011-07-26 NOTE — Telephone Encounter (Signed)
Advise patient: xr ok, plan is the same

## 2011-07-26 NOTE — Telephone Encounter (Signed)
Pt would like to get results of X-ray. .Please advise

## 2011-07-26 NOTE — Telephone Encounter (Signed)
LMVOM for pt to return call.

## 2011-07-26 NOTE — Telephone Encounter (Signed)
Discussed with pt

## 2011-07-27 ENCOUNTER — Ambulatory Visit: Payer: PRIVATE HEALTH INSURANCE | Attending: Psychiatry | Admitting: Physical Therapy

## 2011-07-27 DIAGNOSIS — R279 Unspecified lack of coordination: Secondary | ICD-10-CM | POA: Insufficient documentation

## 2011-07-27 DIAGNOSIS — G20A1 Parkinson's disease without dyskinesia, without mention of fluctuations: Secondary | ICD-10-CM | POA: Insufficient documentation

## 2011-07-27 DIAGNOSIS — G20 Parkinson's disease: Secondary | ICD-10-CM | POA: Insufficient documentation

## 2011-07-27 DIAGNOSIS — R471 Dysarthria and anarthria: Secondary | ICD-10-CM | POA: Insufficient documentation

## 2011-07-27 DIAGNOSIS — I69919 Unspecified symptoms and signs involving cognitive functions following unspecified cerebrovascular disease: Secondary | ICD-10-CM | POA: Insufficient documentation

## 2011-07-27 DIAGNOSIS — Z5189 Encounter for other specified aftercare: Secondary | ICD-10-CM | POA: Insufficient documentation

## 2011-08-01 ENCOUNTER — Ambulatory Visit: Payer: PRIVATE HEALTH INSURANCE | Admitting: Physical Therapy

## 2011-08-03 ENCOUNTER — Ambulatory Visit: Payer: PRIVATE HEALTH INSURANCE | Admitting: Physical Therapy

## 2011-08-09 ENCOUNTER — Encounter: Payer: Self-pay | Admitting: Obstetrics and Gynecology

## 2011-08-09 DIAGNOSIS — G20 Parkinson's disease: Secondary | ICD-10-CM | POA: Insufficient documentation

## 2011-08-09 DIAGNOSIS — C679 Malignant neoplasm of bladder, unspecified: Secondary | ICD-10-CM | POA: Insufficient documentation

## 2011-08-09 DIAGNOSIS — N3281 Overactive bladder: Secondary | ICD-10-CM | POA: Insufficient documentation

## 2011-08-22 ENCOUNTER — Telehealth: Payer: Self-pay | Admitting: *Deleted

## 2011-08-22 ENCOUNTER — Other Ambulatory Visit: Payer: Self-pay | Admitting: Obstetrics and Gynecology

## 2011-08-22 NOTE — Telephone Encounter (Signed)
Pt called requesting diag mammogram order to be faxed, pt c/o breast soreness. I explained to pt that OV is required if any breast problem. Told pt Dr.G is out of the office and TF & JF would be glad to see her, but pt said she will wait until G returned, pt transferred to appointment desk.

## 2011-08-23 ENCOUNTER — Other Ambulatory Visit: Payer: Self-pay | Admitting: *Deleted

## 2011-08-23 ENCOUNTER — Ambulatory Visit: Payer: PRIVATE HEALTH INSURANCE | Attending: Psychiatry | Admitting: Physical Therapy

## 2011-08-23 DIAGNOSIS — R471 Dysarthria and anarthria: Secondary | ICD-10-CM | POA: Insufficient documentation

## 2011-08-23 DIAGNOSIS — I69919 Unspecified symptoms and signs involving cognitive functions following unspecified cerebrovascular disease: Secondary | ICD-10-CM | POA: Insufficient documentation

## 2011-08-23 DIAGNOSIS — G20A1 Parkinson's disease without dyskinesia, without mention of fluctuations: Secondary | ICD-10-CM | POA: Insufficient documentation

## 2011-08-23 DIAGNOSIS — Z5189 Encounter for other specified aftercare: Secondary | ICD-10-CM | POA: Insufficient documentation

## 2011-08-23 DIAGNOSIS — G20 Parkinson's disease: Secondary | ICD-10-CM | POA: Insufficient documentation

## 2011-08-23 DIAGNOSIS — R279 Unspecified lack of coordination: Secondary | ICD-10-CM | POA: Insufficient documentation

## 2011-08-23 MED ORDER — ZOLPIDEM TARTRATE 10 MG PO TABS: 10.0000 mg | ORAL_TABLET | Freq: Every evening | ORAL | Status: DC | PRN

## 2011-08-25 ENCOUNTER — Ambulatory Visit: Payer: PRIVATE HEALTH INSURANCE | Admitting: Physical Therapy

## 2011-08-29 ENCOUNTER — Ambulatory Visit: Payer: PRIVATE HEALTH INSURANCE | Admitting: Physical Therapy

## 2011-08-31 ENCOUNTER — Ambulatory Visit: Payer: PRIVATE HEALTH INSURANCE | Admitting: Physical Therapy

## 2011-09-04 ENCOUNTER — Ambulatory Visit: Payer: PRIVATE HEALTH INSURANCE | Admitting: Physical Therapy

## 2011-09-05 ENCOUNTER — Ambulatory Visit (INDEPENDENT_AMBULATORY_CARE_PROVIDER_SITE_OTHER): Payer: PRIVATE HEALTH INSURANCE | Admitting: Obstetrics and Gynecology

## 2011-09-05 ENCOUNTER — Encounter: Payer: Self-pay | Admitting: Obstetrics and Gynecology

## 2011-09-05 VITALS — BP 110/66 | Ht 63.0 in | Wt 174.0 lb

## 2011-09-05 DIAGNOSIS — Z01419 Encounter for gynecological examination (general) (routine) without abnormal findings: Secondary | ICD-10-CM

## 2011-09-05 NOTE — Progress Notes (Signed)
Patient came to see me today for her annual GYN exam. She is doing well with her bladder cancer without recurrence. She does have vaginal dryness but responds to over-the-counter medicine. She is having no vaginal bleeding. She is having no pelvic pain. Over the past 6 months she has been having intermittent breast pain in her right outer breast without any sign of a mass. She had her last mammogram a year ago. She has had 3 normal bone densities in our office. The last one was 2 years ago. She has had no fractures. She's had no vaginal bleeding. She is having no pelvic pain. She continues to be treated for Parkinson's with some deterioration. She is followed by the urologists for bladder cancer. She is also being treated for her overactive bladder.  Physical examination: Kristina Carr present. HEENT within normal limits. Neck: Thyroid not large. No masses. Supraclavicular nodes: not enlarged. Breasts: Examined in both sitting and lying  position. No skin changes and no masses. Abdomen: Soft no guarding rebound or masses or hernia. Pelvic: External: Within normal limits. BUS: Within normal limits. Vaginal:within normal limits. Poor  estrogen effect. No evidence of cystocele rectocele or enterocele. Cervix: clean. Uterus: Normal size and shape. Adnexa: No masses. Rectovaginal exam: Confirmatory and negative. Extremities: Within normal limits.  Assessment: #1. Mastodynia-right breast with no mass felt #2 atrophic vaginitis #3 detrussor instability #4 bladder cancer#5 Parkinson's disease  Plan: Diagnostic mammogram. Continue over the counter medication for atrophic vaginitis.The new Pap smear guidelines were discussed with the patient. No pap done.

## 2011-09-06 ENCOUNTER — Ambulatory Visit: Payer: PRIVATE HEALTH INSURANCE | Admitting: Physical Therapy

## 2011-09-06 ENCOUNTER — Telehealth: Payer: Self-pay | Admitting: *Deleted

## 2011-09-06 DIAGNOSIS — N644 Mastodynia: Secondary | ICD-10-CM

## 2011-09-06 LAB — URINALYSIS W MICROSCOPIC + REFLEX CULTURE
Bacteria, UA: NONE SEEN
Bilirubin Urine: NEGATIVE
Crystals: NONE SEEN
Glucose, UA: NEGATIVE mg/dL
Ketones, ur: NEGATIVE mg/dL
Specific Gravity, Urine: 1.026 (ref 1.005–1.030)
Squamous Epithelial / LPF: NONE SEEN
Urobilinogen, UA: 0.2 mg/dL (ref 0.0–1.0)

## 2011-09-06 NOTE — Telephone Encounter (Signed)
Spoke with casey at [facility] center order should be diagnostic bil. mammogram, order placed

## 2011-09-06 NOTE — Telephone Encounter (Signed)
Message copied by Aura Camps on Wed Sep 06, 2011  8:55 AM ------      Message from: Trellis Paganini      Created: Tue Sep 05, 2011  2:21 PM       Please schedule patient for diagnostic mammogram of right breast and screening mammogram of left breast at [facility]. Diagnoses is mastodynia of right outer breast. Please let me know when she is scheduled as well as the other patient I ordered a diagnostic mammogram on today.

## 2011-09-12 ENCOUNTER — Ambulatory Visit: Payer: PRIVATE HEALTH INSURANCE | Admitting: Physical Therapy

## 2011-09-14 ENCOUNTER — Ambulatory Visit: Payer: PRIVATE HEALTH INSURANCE | Attending: Psychiatry | Admitting: Physical Therapy

## 2011-09-14 DIAGNOSIS — R279 Unspecified lack of coordination: Secondary | ICD-10-CM | POA: Insufficient documentation

## 2011-09-14 DIAGNOSIS — G20 Parkinson's disease: Secondary | ICD-10-CM | POA: Insufficient documentation

## 2011-09-14 DIAGNOSIS — G20A1 Parkinson's disease without dyskinesia, without mention of fluctuations: Secondary | ICD-10-CM | POA: Insufficient documentation

## 2011-09-14 DIAGNOSIS — I69919 Unspecified symptoms and signs involving cognitive functions following unspecified cerebrovascular disease: Secondary | ICD-10-CM | POA: Insufficient documentation

## 2011-09-14 DIAGNOSIS — R471 Dysarthria and anarthria: Secondary | ICD-10-CM | POA: Insufficient documentation

## 2011-09-14 DIAGNOSIS — Z5189 Encounter for other specified aftercare: Secondary | ICD-10-CM | POA: Insufficient documentation

## 2011-09-19 ENCOUNTER — Ambulatory Visit: Payer: PRIVATE HEALTH INSURANCE | Admitting: Physical Therapy

## 2011-09-19 NOTE — Telephone Encounter (Signed)
Breast center appointment on 09/22/11 1;10pm

## 2011-09-22 ENCOUNTER — Ambulatory Visit (INDEPENDENT_AMBULATORY_CARE_PROVIDER_SITE_OTHER): Payer: PRIVATE HEALTH INSURANCE | Admitting: Family Medicine

## 2011-09-22 ENCOUNTER — Ambulatory Visit
Admission: RE | Admit: 2011-09-22 | Discharge: 2011-09-22 | Disposition: A | Discharge status: Home / Self Care | Payer: PRIVATE HEALTH INSURANCE | Source: Ambulatory Visit | Attending: Obstetrics and Gynecology | Admitting: Obstetrics and Gynecology

## 2011-09-22 ENCOUNTER — Ambulatory Visit: Payer: PRIVATE HEALTH INSURANCE | Admitting: *Deleted

## 2011-09-22 ENCOUNTER — Other Ambulatory Visit: Payer: Self-pay | Admitting: Obstetrics and Gynecology

## 2011-09-22 ENCOUNTER — Encounter: Payer: Self-pay | Admitting: Family Medicine

## 2011-09-22 VITALS — BP 108/68 | HR 77 | Temp 98.3°F | Wt 176.2 lb

## 2011-09-22 DIAGNOSIS — N644 Mastodynia: Secondary | ICD-10-CM

## 2011-09-22 DIAGNOSIS — M25559 Pain in unspecified hip: Secondary | ICD-10-CM

## 2011-09-22 MED ORDER — CELECOXIB 200 MG PO CAPS: 200.0000 mg | ORAL_CAPSULE | Freq: Two times a day (BID) | ORAL | Status: AC

## 2011-09-22 NOTE — Patient Instructions (Addendum)
This appears to be caused by nerve irritation in the groin/hip You have limited rotation of the L hip- particularly internal rotation- and pain over the large hip flexor tendons Start the Celebrex daily for the inflammation Use the Valium as needed for pain and spasm Call your neurologist and get his opinion on how best to proceed Call with any questions or concerns Hang in there! Happy Early [place] Ouch!

## 2011-09-22 NOTE — Assessment & Plan Note (Addendum)
New to provider, ongoing for pt.  Appears to be strain/irritation of hip flexor that is causing neuropathic pain into L thigh.  Start daily NSAID (samples of Celebrex given) to improve inflammation.  Discussed starting muscle relaxer but pt reports bad rxn to flexeril and is hesitant to try similar type of med.  Prefers to take Valium that she has at home for when she has back spasms.  Encouraged her to call neuro to discuss this as this may be due to her parkinson's (rigidity, spasm).  Pt expressed understanding and is in agreement w/ plan.

## 2011-09-22 NOTE — Progress Notes (Signed)
  Subjective:    Patient ID: Kristina Carr, female    DOB: Jul 05, 1945, 66 y.o.   MRN: [id number]  HPI Leg pain- L sided, hx of similar.  Described as a stabbing pain in the groin.  'it feels like it's catching on something'.  Pain can occur multiple times daily.  Has been worsening recently but has had pain for ~2 months.  Pain is better today but yesterday was 'terrible'.  At times will have to lift leg manually due to pain.  Hx of back problems w/ current arthritis.  Pt is curious if pain is due to Parkinson's.  Pain is worse w/ hip flexion.  Pain is described as lightning in the groin and will radiate into L thigh.  Difficulty w/ stairs.  Currently seeing Dr Zola Button (sounds like [facility]) at [facility].   Review of Systems For ROS see HPI     Objective:   Physical Exam  Vitals reviewed. Constitutional: She appears well-developed and well-nourished. No distress.  Musculoskeletal:       Mild pain w/ hip flexion Pain w/ internal rotation- very limited motion Pain w/ palpation of hip flexor tendons  Neurological: She is alert. No cranial nerve deficit.       Resting tremor + rigidity w/ some cogwheeling  Psychiatric: She has a normal mood and affect. Her behavior is normal. Thought content normal.          Assessment & Plan:

## 2011-09-25 NOTE — Telephone Encounter (Signed)
Called in.

## 2011-09-26 ENCOUNTER — Ambulatory Visit: Payer: PRIVATE HEALTH INSURANCE | Admitting: Physical Therapy

## 2011-09-28 ENCOUNTER — Ambulatory Visit: Payer: PRIVATE HEALTH INSURANCE | Admitting: Physical Therapy

## 2011-10-04 ENCOUNTER — Ambulatory Visit: Payer: PRIVATE HEALTH INSURANCE | Admitting: Physical Therapy

## 2011-10-06 ENCOUNTER — Ambulatory Visit: Payer: PRIVATE HEALTH INSURANCE | Admitting: Physical Therapy

## 2011-10-06 ENCOUNTER — Telehealth: Payer: Self-pay | Admitting: Internal Medicine

## 2011-10-06 DIAGNOSIS — M79606 Pain in leg, unspecified: Secondary | ICD-10-CM

## 2011-10-06 MED ORDER — HYDROCODONE-ACETAMINOPHEN 2.5-500 MG PO TABS: 1.0000 | ORAL_TABLET | ORAL | Status: AC | PRN

## 2011-10-06 NOTE — Telephone Encounter (Signed)
Called pt & she states that the pain starts in her groin down into her left knee. Pt states she has to pick up her leg to get into the car. Please advise.

## 2011-10-06 NOTE — Telephone Encounter (Signed)
Discussed with pt, sent in rx, & entered referral.  

## 2011-10-06 NOTE — Telephone Encounter (Signed)
Was seen by another provider at 09-22-2011, they talk about possibly  Talk to  neurology about the pain. Since the pain is ongoing, I recommend: Low dose of Vicodin when necessary, see prescription, make patient aware that it can cause drowsiness, needs to ne careful. See orthopedic surgery, please arrange a referral

## 2011-10-06 NOTE — Telephone Encounter (Signed)
Pt still having leg problems. She states she thinks she might need a referral. She would like someone to call her back. [phone]

## 2011-10-10 IMAGING — CT CT ABDOMEN W/ CM
2 of 5 series · 17 of 46 positions shown, 19 images · IV contrast (agent unspecified)
Comparison: None

CLINICAL DATA: Abdominal pain and bloating.

CT ABDOMEN WITH CONTRAST
TECHNIQUE: Multidetector CT imaging of the abdomen was performed
using the standard protocol following bolus administration of
intravenous contrast.
Contrast: 100 ml of omni 300

[Series 2: abd/ pel 5mm · axial · 0.76mm/px · z∈[-309,-49]mm · 14 of 60 slices shown, 16 images]
[im 4/60  soft-tissue]
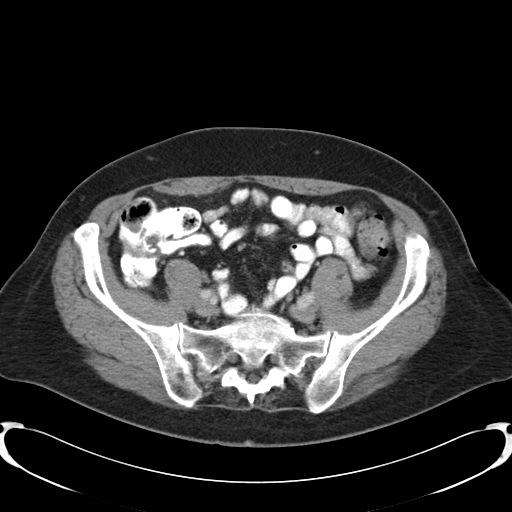
[im 4/60  bone]
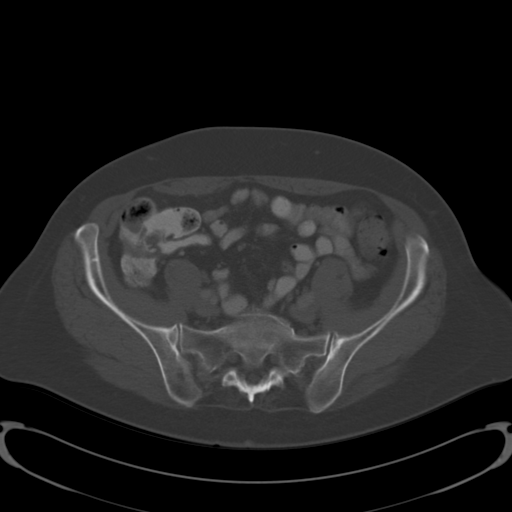
[im 8/60  soft-tissue]
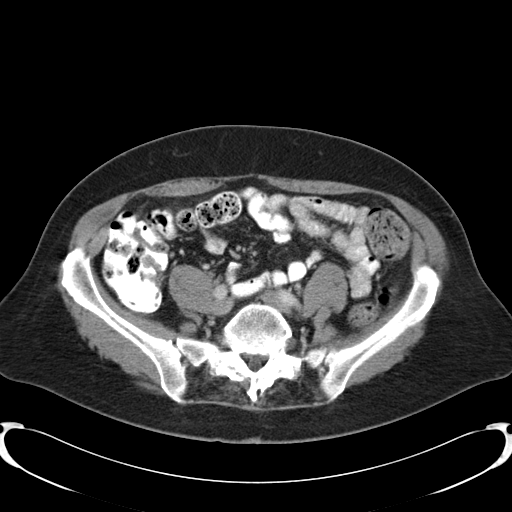
[im 12/60  soft-tissue]
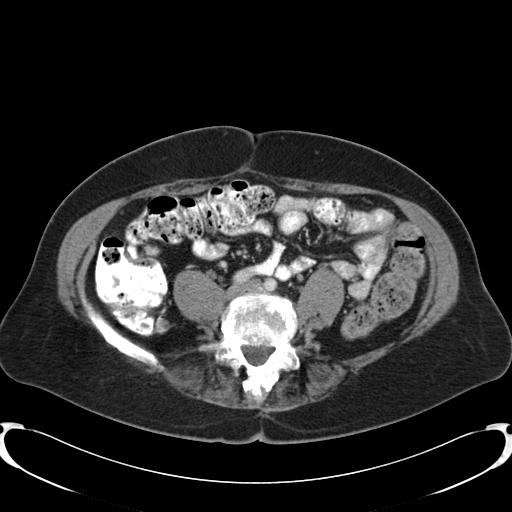
[im 15/60  soft-tissue]
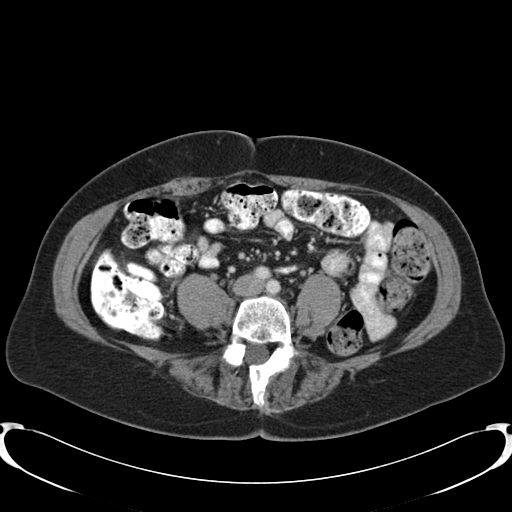
[im 19/60  soft-tissue]
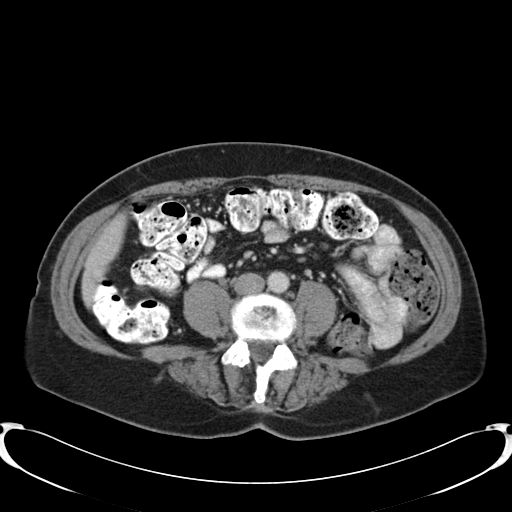
[im 23/60  soft-tissue]
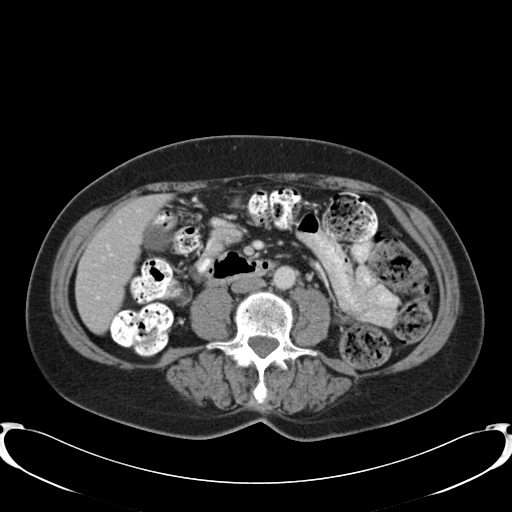
[im 26/60  soft-tissue]
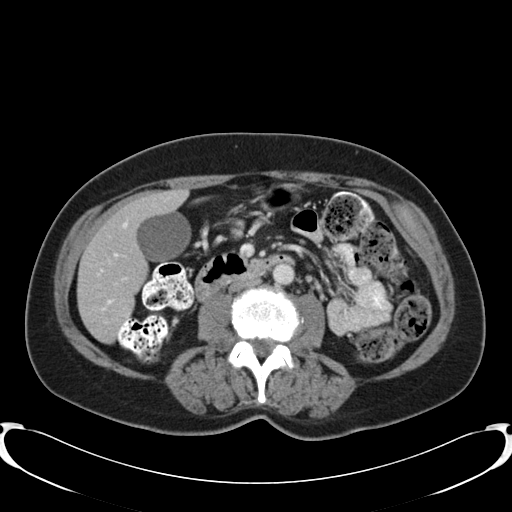
[im 34/60  soft-tissue]
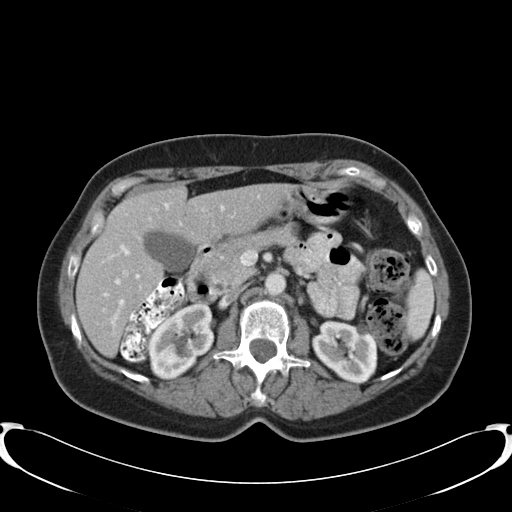
[im 37/60  soft-tissue]
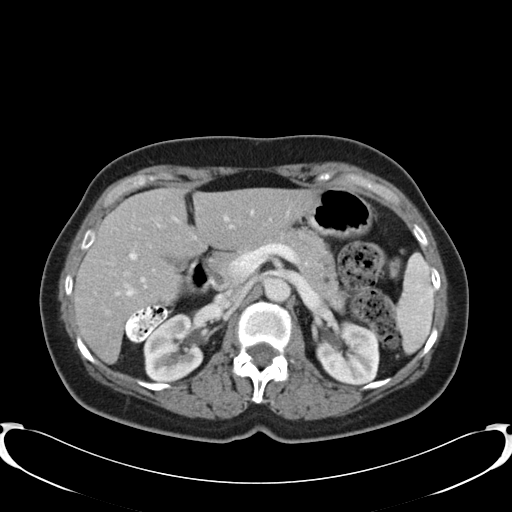
[im 37/60  bone]
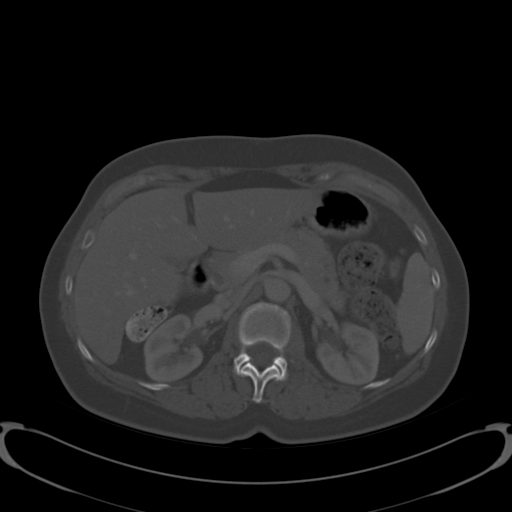
[im 41/60  soft-tissue]
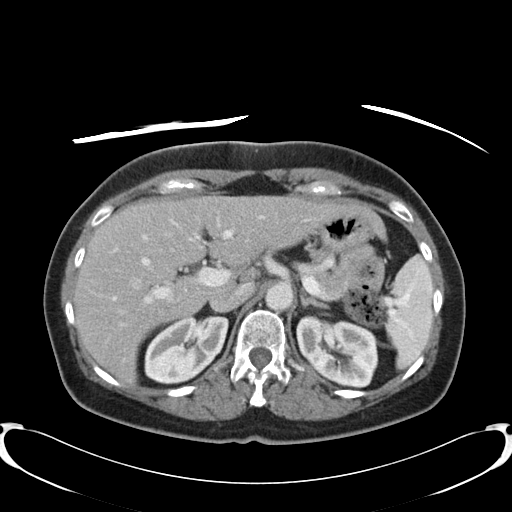
[im 45/60  soft-tissue]
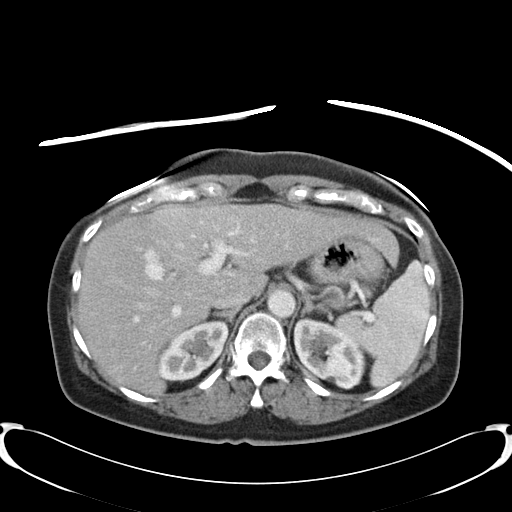
[im 48/60  soft-tissue]
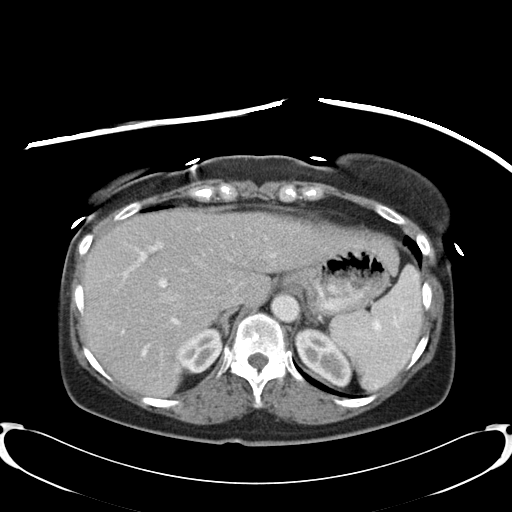
[im 52/60  soft-tissue]
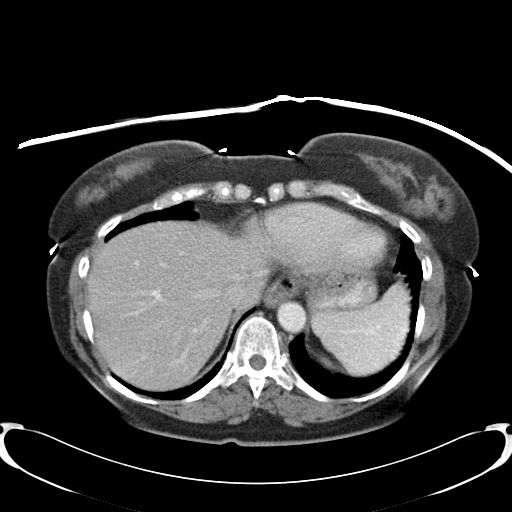
[im 56/60  soft-tissue]
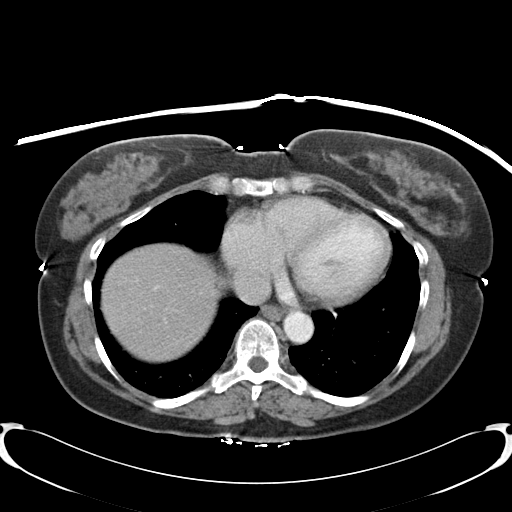

[Series 602: cor · coronal · 0.76mm/px · 3 of 108 slices shown]
[im 36/108  soft-tissue]
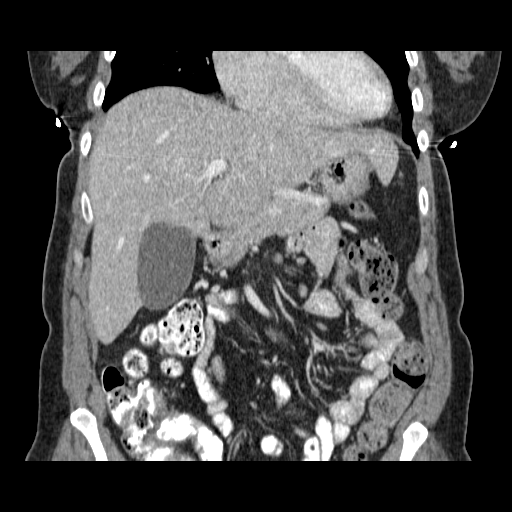
[im 48/108  soft-tissue]
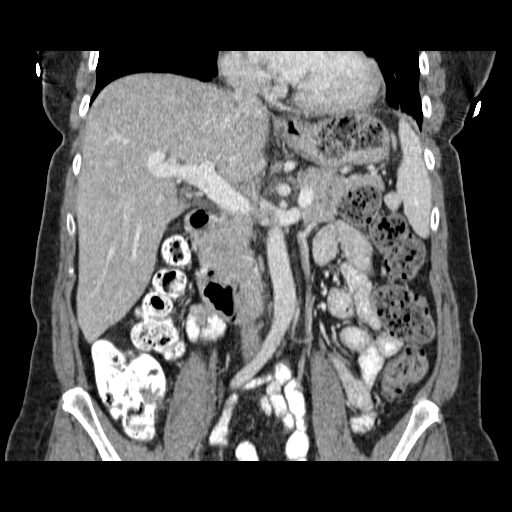
[im 60/108  soft-tissue]
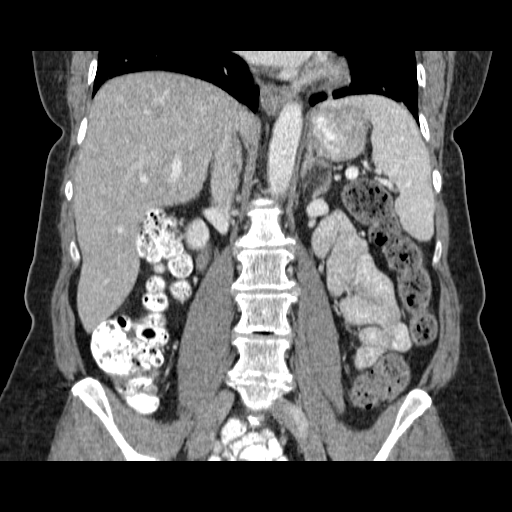

[17 of 46 positions shown; findings below may reference images not displayed]

FINDINGS: The visualized lung bases are clear.

No focal liver abnormalities identified.

 The gallbladder is normal.

The common bile duct has a normal caliber.

Pancreas appears normal.

The spleen is within normal limits.

Normal adrenal glands.

The right kidney is normal.

Bilateral renal cysts identified.

There is no enlarged upper abdominal lymph nodes.

The stomach and small bowel loops have a normal caliber.  The colon
is negative.

Review of the visualized osseous structures is significant for
postoperative change within the lower lumbar spine.
IMPRESSION: 1.  No acute findings.
2.  No mass or adenopathy identified.

## 2011-10-11 ENCOUNTER — Ambulatory Visit: Payer: PRIVATE HEALTH INSURANCE | Attending: Psychiatry | Admitting: Physical Therapy

## 2011-10-11 DIAGNOSIS — I69919 Unspecified symptoms and signs involving cognitive functions following unspecified cerebrovascular disease: Secondary | ICD-10-CM | POA: Insufficient documentation

## 2011-10-11 DIAGNOSIS — R471 Dysarthria and anarthria: Secondary | ICD-10-CM | POA: Insufficient documentation

## 2011-10-11 DIAGNOSIS — R279 Unspecified lack of coordination: Secondary | ICD-10-CM | POA: Insufficient documentation

## 2011-10-11 DIAGNOSIS — Z5189 Encounter for other specified aftercare: Secondary | ICD-10-CM | POA: Insufficient documentation

## 2011-10-11 DIAGNOSIS — G20 Parkinson's disease: Secondary | ICD-10-CM | POA: Insufficient documentation

## 2011-10-11 DIAGNOSIS — G20A1 Parkinson's disease without dyskinesia, without mention of fluctuations: Secondary | ICD-10-CM | POA: Insufficient documentation

## 2011-11-20 ENCOUNTER — Telehealth: Payer: Self-pay

## 2011-11-20 NOTE — Telephone Encounter (Signed)
Pt states need anything pertaining to back and leg treatments or visits. Pt states 12/06/11 going to Dr. Otelia Sergeant concerning leg and needs this info. Pt states call her to pick info up when ready . [phone] or [phone] (H)           MW

## 2011-11-21 NOTE — Telephone Encounter (Signed)
Copies of reports printed and mailed to patient. Patient aware

## 2011-11-22 ENCOUNTER — Other Ambulatory Visit: Payer: Self-pay

## 2011-11-22 MED ORDER — PANTOPRAZOLE SODIUM 40 MG PO TBEC: 40.0000 mg | DELAYED_RELEASE_TABLET | Freq: Every day | ORAL | Status: DC

## 2011-11-22 MED ORDER — PANTOPRAZOLE SODIUM 40 MG PO TBEC: 40.0000 mg | DELAYED_RELEASE_TABLET | Freq: Two times a day (BID) | ORAL | Status: DC

## 2011-11-22 NOTE — Telephone Encounter (Signed)
Spoke to pt to advise results/instructions. Pt understood. Rx sent to pharmacy for #60 with 3 refills, pt will try this out and see how it goes, will call office for follow up if no resolve noted

## 2011-11-22 NOTE — Telephone Encounter (Signed)
Pt states currently taking Pantoprazole. Pt states acid reflux is bad, [place] pt, allergic to prilosec, back and leg problems so she has been taking aleve. Pt ask is aleve affecting acid reflux. Pt also ask should she change med   Plz advise     MW

## 2011-11-22 NOTE — Telephone Encounter (Signed)
The triggers for reflux or "heart burn"  include stress; the "aspirin family" ; alcohol; peppermint; and caffeine (coffee, tea, cola, and chocolate). The aspirin family would include aspirin and the nonsteroidal agents such as Aleve, ibuprofen &  Naproxen. Tylenol would not cause reflux. If having symptoms ; food & drink should be avoided for @ least 2 hours before going to bed. Take Pantoprazole bid pre b'fast & eve meal

## 2011-11-22 NOTE — Telephone Encounter (Signed)
This is Dr Alwyn Ren pt

## 2011-11-24 ENCOUNTER — Telehealth: Payer: Self-pay

## 2011-11-24 NOTE — Telephone Encounter (Signed)
Pt called in stating she thought someone from our office just called her and she missed the call. I looked at all encounters and notes but didn't see any documents saying someone called and why. I advised pt I didn't see any call documented.    MW

## 2011-12-21 ENCOUNTER — Other Ambulatory Visit: Payer: Self-pay | Admitting: Obstetrics and Gynecology

## 2011-12-21 NOTE — Telephone Encounter (Signed)
Called to pharmacy 

## 2012-01-02 ENCOUNTER — Other Ambulatory Visit: Payer: Self-pay | Admitting: Internal Medicine

## 2012-01-02 NOTE — Telephone Encounter (Signed)
Last OV with [facility] 08/2010, patient seen several times since then by other [facility] MD's   **MEDICATION NOT on med list, please advise on refill request

## 2012-01-02 NOTE — Telephone Encounter (Signed)
Left message on voicemail for patient to return call (home and cell number)

## 2012-01-02 NOTE — Telephone Encounter (Signed)
Left message on voicemail for patient to return call when available   

## 2012-01-02 NOTE — Telephone Encounter (Signed)
This medication is not on the medication list; because of the nature of the medication; clinical indication is needed for this to be refilled.

## 2012-01-03 NOTE — Telephone Encounter (Signed)
Pt returned your call. She can be reached on her mobile #

## 2012-01-03 NOTE — Telephone Encounter (Signed)
Left message on voicemail-cell requesting patient patient return call with reason for pain med request

## 2012-01-08 NOTE — Telephone Encounter (Signed)
Spoke with patient, patient states rx was originally rx'ed by Dr.Paz when she had back and hip pain. RX was filled by PA at Dr.Ramous office on 01/02/2012 for #30, patient will get injections in back at the end of the week. Patient states she took so long to call back because she was out of town.

## 2012-01-29 ENCOUNTER — Telehealth: Payer: Self-pay | Admitting: Internal Medicine

## 2012-01-29 NOTE — Telephone Encounter (Signed)
Message copied by Verner Chol on Mon Jan 29, 2012  2:31 PM ------      Message from: Pecola Lawless      Created: Sun Jan 28, 2012 12:42 PM       Please schedule followup appointment in MID LATE JANUARY.  For pre op eval ; surgery 04/12/12

## 2012-01-29 NOTE — Telephone Encounter (Signed)
scheduled for 1.20.14 at 9am

## 2012-02-06 ENCOUNTER — Telehealth: Payer: Self-pay | Admitting: *Deleted

## 2012-02-06 NOTE — Telephone Encounter (Signed)
Pt states that she is having some burning with urination and would like to drop off a urine sample. Pt advise that urine has to be collect in sterile cup so she is free to come in to office and leave sample or we can place cup up front for her to pickup. Pt indicated that she will come in to office to leave samples once she returns.Pt notes that she will also like to have these results sent to her neurologist. Pt will provide info when she comes in for UA.

## 2012-02-12 ENCOUNTER — Other Ambulatory Visit (INDEPENDENT_AMBULATORY_CARE_PROVIDER_SITE_OTHER): Payer: No Typology Code available for payment source

## 2012-02-12 DIAGNOSIS — R300 Dysuria: Secondary | ICD-10-CM

## 2012-02-12 LAB — POCT URINALYSIS DIPSTICK
Bilirubin, UA: NEGATIVE
Blood, UA: NEGATIVE
Ketones, UA: NEGATIVE
Protein, UA: NEGATIVE
Spec Grav, UA: <=1.005
pH, UA: 5

## 2012-02-13 NOTE — Progress Notes (Signed)
LABS ONLY  

## 2012-02-15 LAB — URINE CULTURE

## 2012-02-16 ENCOUNTER — Encounter: Payer: Self-pay | Admitting: *Deleted

## 2012-02-26 ENCOUNTER — Encounter: Payer: Self-pay | Admitting: Internal Medicine

## 2012-02-26 ENCOUNTER — Ambulatory Visit (INDEPENDENT_AMBULATORY_CARE_PROVIDER_SITE_OTHER): Payer: BC Managed Care – PPO | Admitting: Internal Medicine

## 2012-02-26 VITALS — BP 114/72 | HR 86 | Temp 98.1°F | Resp 12 | Ht 63.08 in | Wt 176.0 lb

## 2012-02-26 DIAGNOSIS — M1610 Unilateral primary osteoarthritis, unspecified hip: Secondary | ICD-10-CM

## 2012-02-26 DIAGNOSIS — G8929 Other chronic pain: Secondary | ICD-10-CM

## 2012-02-26 DIAGNOSIS — G20 Parkinson's disease: Secondary | ICD-10-CM

## 2012-02-26 DIAGNOSIS — K219 Gastro-esophageal reflux disease without esophagitis: Secondary | ICD-10-CM

## 2012-02-26 DIAGNOSIS — M169 Osteoarthritis of hip, unspecified: Secondary | ICD-10-CM

## 2012-02-26 DIAGNOSIS — R7309 Other abnormal glucose: Secondary | ICD-10-CM

## 2012-02-26 DIAGNOSIS — G20A1 Parkinson's disease without dyskinesia, without mention of fluctuations: Secondary | ICD-10-CM

## 2012-02-26 DIAGNOSIS — E785 Hyperlipidemia, unspecified: Secondary | ICD-10-CM

## 2012-02-26 DIAGNOSIS — R0789 Other chest pain: Secondary | ICD-10-CM

## 2012-02-26 LAB — HEMOGLOBIN A1C: Hgb A1c MFr Bld: 6 % (ref 4.6–6.5)

## 2012-02-26 LAB — HEPATIC FUNCTION PANEL
ALT: 5 U/L (ref 0–35)
AST: 15 U/L (ref 0–37)
Alkaline Phosphatase: 57 U/L (ref 39–117)
Bilirubin, Direct: 0 mg/dL (ref 0.0–0.3)
Total Bilirubin: 0.4 mg/dL (ref 0.3–1.2)

## 2012-02-26 LAB — BASIC METABOLIC PANEL
BUN: 24 mg/dL — ABNORMAL HIGH (ref 6–23)
CO2: 26 mEq/L (ref 19–32)
Calcium: 9.2 mg/dL (ref 8.4–10.5)
GFR: 76.67 mL/min (ref >60.00)
Glucose, Bld: 106 mg/dL — ABNORMAL HIGH (ref 70–99)
Sodium: 136 mEq/L (ref 135–145)

## 2012-02-26 LAB — TSH: TSH: 0.71 u[IU]/mL (ref 0.35–5.50)

## 2012-02-26 LAB — LDL CHOLESTEROL, DIRECT: Direct LDL: 135.1 mg/dL

## 2012-02-26 LAB — LIPID PANEL: HDL: 43.6 mg/dL (ref >39.00)

## 2012-02-26 NOTE — Patient Instructions (Addendum)
If you activate My Chart; the results can be released to you as soon as they populate from the lab. If you choose not to use this program; the labs have to be reviewed, copied & mailed   causing a delay in getting the results to you. 

## 2012-02-26 NOTE — Progress Notes (Signed)
Subjective:    Patient ID: Kristina Carr, female    DOB: 1945/12/31, 67 y.o.   MRN: [id number]  HPI Pre op evaluation: Surgical diagnosis:end stage DJD L hip Tentative surgical date/Surgeon:Dr Alusio/ THA w/wo autograft/allograft 04/12/2012 Character: sharp , stabbing Severity:up to 10  Activity of daily living limitation/impairment of function:able to walk < 500 feet Treatment to date, efficacy:steroid injection helped < 1 week Significant past medical history: Diabetes; dyslipidemia; parkinson's; bladder cancer    Review of Systems  Chest pain, palpitations- seen in ER X 3 in past few months. Negative stress test 03/28/11 Dyspnea- yes as sedentary due to hip & Parkinson's Lightheadedness,Syncope- near syncope posturally in context of drugs for Parkinson's    Edema- no Medications: Compliance- statin intolerant  Abd pain, bowel changes- much improved ; S/P evaluation by Dr Jarold Motto   Muscle aches- only RLS       Objective:   Physical Exam Gen.:  Adequately nourished in appearance. Alert, appropriate and cooperative throughout exam.   Head: Normocephalic without obvious abnormalities Eyes: No corneal or conjunctival inflammation noted. Pupils equal round reactive to light and accommodation.  Ears: External  ear exam reveals no significant lesions or deformities. Canals clear .TMs normal. Hearing is grossly normal bilaterally. Nose: External nasal exam reveals no deformity or inflammation. Nasal mucosa are pink and moist. No lesions or exudates noted.  Mouth: Oral mucosa and oropharynx reveal no lesions or exudates. Teeth in good repair. Neck: No deformities, masses, or tenderness noted.  Thyroid normal. Lungs: Normal respiratory effort; chest expands symmetrically. Lungs are clear to auscultation without rales, wheezes, or increased work of breathing. Heart: Normal rate and rhythm. Normal S1 and S2. No gallop, click, or rub. S4 w/o murmur. Abdomen: Bowel sounds normal; abdomen  soft and nontender. No masses, organomegaly or hernias noted. Genitalia: Dr Eda Paschal                                   Musculoskeletal/extremities: No deformity or scoliosis noted of  the thoracic or lumbar spine. No clubbing, cyanosis, edema, or significant extremity  deformity noted. Range of motion not tested due to hip pain.Tone & strength  normal.Joints  reveal minor  DJD DIP changes. Nail health good. Able to lie down & sit up w/o help.  Vascular: Carotid, radial artery, dorsalis pedis and  posterior tibial pulses are full and equal. No bruits present. Neurologic: Alert and oriented x3. Fine tremor of hands        Skin: Intact without suspicious lesions or rashes. Lymph: No cervical, axillary lymphadenopathy present. Psych: Mood and affect are clinically flat but she is normally interactive                                                                                         Assessment & Plan:  #1 end-stage degenerative joint disease left hip  #2 atypical chest pain with negative stress test 2/13. EKG  reveals electrical interference. No ischemic ST-T changes present  #3 dyslipidemia with statin intolerance  Plan: See orders and recommendations. Medically she is cleared for surgery  pending review of outstanding labs

## 2012-03-11 ENCOUNTER — Other Ambulatory Visit: Payer: Self-pay | Admitting: Orthopedic Surgery

## 2012-03-11 MED ORDER — DEXAMETHASONE SODIUM PHOSPHATE 10 MG/ML IJ SOLN: 10.0000 mg | Freq: Once | INTRAMUSCULAR | Status: DC

## 2012-03-11 MED ORDER — BUPIVACAINE LIPOSOME 1.3 % IJ SUSP: 20.0000 mL | Freq: Once | INTRAMUSCULAR | Status: DC

## 2012-03-11 NOTE — Progress Notes (Signed)
Preoperative surgical orders have been place into the Epic hospital system for Kristina Carr on 03/11/2012, 8:29 AM  by Patrica Duel for surgery on 04/12/2012.  Preop Total Hip - Anterior Approach orders including Experel Injecion, IV Tylenol, and IV Decadron as long as there are no contraindications to the above medications. Avel Peace, PA-C

## 2012-04-01 ENCOUNTER — Encounter (HOSPITAL_COMMUNITY): Payer: Self-pay | Admitting: Pharmacy Technician

## 2012-04-09 NOTE — Progress Notes (Addendum)
Anesthesia chart review: Patient is a 67 year old female scheduled for left THA by Dr. Lequita Halt on 04/12/12.    History includes nonsmoker, hyperlipidemia, IBS, hiatal hernia, atypical chest pain with normal stress test 03/2011, Parkinson's disease diagnosed ~ 3 years ago (Dr. Zola Button at [facility], records requested), palpitations, bladder cancer s/p cystoscopy with TURBT and instillation of mitomycin by Dr. Isabel Caprice 01/2010), anxiety, RLS, lumbar laminectomy X 3 (last at age 60), c-section with spinal at age 40.  PCP is Dr. Alwyn Ren who cleared patient for this procedure.    Patient presents with her spouse and sister.  She is interested in at least considering spinal anesthesia because she feels she may tolerate it better with her underlying Parkinson's.  However, she has had multiple back surgeries, and her anesthesiologist at the time of her c-section had a difficult time placing spinal anesthesia.  She reports he had to go higher than usual.  It was quite uncomfortable for her so if spinal anesthesia is attempted then she would prefer as much sedation that is felt safely possible.  She also wants to make sure that her physicians and staff are compliant with not prescribing her medications that should be avoided with Parkinson's.  She also mentioned possibly needing increased doses of Sinemet after surgery.  I stressed to her that our anesthesia staff is familiar with working with patient's with Parkinson's and would be involved in her treatment thru her PACU stay, but that Dr. Lequita Halt and his staff would be responsible for post-operative orders including medication following discharge from PACU.  She was encouraged to talk with Dr. Lequita Halt and his staff further if she felt there was any questions regarding her post-operative care.  (I also left a voicemail for Toniann Fail at Dr. Deri Fuelling office.)  Anesthesiologist Dr. Randa Evens also evaluated patient during her PAT visit.  She explained that since obtaining access for spinal  anesthesia was difficult at age 56, then it is likely even more so 30 years later.  She felt that most of our anesthesiologists would probably make one attempt at a spinal if patient felt strongly about this method; however, she should be prepared to undergo general anesthesia as her assigned anesthesiologist may not think a spinal is the best option or attempts may be unsuccessful.  Patient will consider her options and will talk further with her assigned anesthesiologist on the day of surgery.  EKG on 02/26/12 showed NSR, PACs, baseline artifact.  She had a normal nuclear stress test on 03/28/11.  There was a small fixed apical anterior perfusion defect that was thought to be due to breast attenuation, EF 76%.  CXR on 04/10/12 showed: 1. Low lung volumes without radiographic evidence of acute cardiopulmonary disease.  2. Atherosclerosis in the thoracic aorta.  3. Small dense nodules in the right mid lung are compatible with calcified granulomas and are unchanged.  Prior radiology reports that made mention of her lumbar anatomy include 1. CT of the abd/pelvis from 04/13/10 that stated "Review of the visualized osseous structures is significant for postoperative change within the lower lumbar spine."  2. Lumbar films from 10/22/08 showed no acute findings about the lumbar spine, moderate multilevel spondylosis, most severe inferiorly (most severe L4-5 through S1-2 with severe facet arthropathy at S1-2), transitional anatomy, limited ROM without evidence of instability.  (Per Dr. Frederik Pear office, any additional studies that he would have access to are already scanned into Epic.  I did request any additional lumbar studies from [facility], if available.)  Preoperative labs noted.  If no acute changes then would anticipate she could proceed as planned.  Her assigned anesthesiologist will discuss the definitive anesthesia plan on the day of surgery.  Velna Ochs [facility]  [facility]/Anesthesiology Phone [phone] 04/10/2012 12:12 PM

## 2012-04-09 NOTE — Pre-Procedure Instructions (Addendum)
Kristina Carr  04/09/2012   Your procedure is scheduled on: 04/12/12  Report to [facility] at 530 AM.  Call this number if you have problems the morning of surgery: [phone]   Remember:   Do not eat food or drink liquids after midnight.   Take these medicines the morning of surgery with A SIP OF WATER: pain med, sinemet, lexapro,ditropan,protonix,   Do not wear jewelry, make-up or nail polish.  Do not wear lotions, powders, or perfumes. You may wear deodorant.  Do not shave 48 hours prior to surgery. Men may shave face and neck.  Do not bring valuables to the hospital.  Contacts, dentures or bridgework may not be worn into surgery.  Leave suitcase in the car. After surgery it may be brought to your room.  For patients admitted to the hospital, checkout time is 11:00 AM the day of  discharge.   Patients discharged the day of surgery will not be allowed to drive  home.  Name and phone number of your driver:   Special Instructions: Incentive Spirometry - Practice and bring it with you on the day of surgery. Shower using CHG 2 nights before surgery and the night before surgery.  If you shower the day of surgery use CHG.  Use special wash - you have one bottle of CHG for all showers.  You should use approximately 1/3 of the bottle for each shower.   Please read over the following fact sheets that you were given: Pain Booklet, Coughing and Deep Breathing, Blood Transfusion Information, Total Joint Packet and Surgical Site Infection Prevention

## 2012-04-10 ENCOUNTER — Encounter (HOSPITAL_COMMUNITY)
Admission: RE | Admit: 2012-04-10 | Discharge: 2012-04-10 | Disposition: A | Discharge status: Home / Self Care | Payer: BC Managed Care – PPO | Source: Ambulatory Visit | Attending: Anesthesiology | Admitting: Anesthesiology

## 2012-04-10 ENCOUNTER — Encounter (HOSPITAL_COMMUNITY): Payer: Self-pay

## 2012-04-10 ENCOUNTER — Encounter (HOSPITAL_COMMUNITY)
Admission: RE | Admit: 2012-04-10 | Discharge: 2012-04-10 | Disposition: A | Discharge status: Home / Self Care | Payer: BC Managed Care – PPO | Source: Ambulatory Visit | Attending: Orthopedic Surgery | Admitting: Orthopedic Surgery

## 2012-04-10 HISTORY — DX: Nausea with vomiting, unspecified: R11.2

## 2012-04-10 HISTORY — DX: Personal history of urinary (tract) infections: Z87.440

## 2012-04-10 HISTORY — DX: Unspecified osteoarthritis, unspecified site: M19.90

## 2012-04-10 HISTORY — DX: Other specified postprocedural states: Z98.890

## 2012-04-10 LAB — COMPREHENSIVE METABOLIC PANEL
ALT: 6 U/L (ref 0–35)
AST: 22 U/L (ref 0–37)
CO2: 27 mEq/L (ref 19–32)
Calcium: 10.2 mg/dL (ref 8.4–10.5)
GFR calc non Af Amer: 76 mL/min — ABNORMAL LOW (ref >90)
Sodium: 138 mEq/L (ref 135–145)
Total Protein: 7.5 g/dL (ref 6.0–8.3)

## 2012-04-10 LAB — CBC
MCH: 29.9 pg (ref 26.0–34.0)
Platelets: 278 10*3/uL (ref 150–400)
RBC: 4.79 MIL/uL (ref 3.87–5.11)

## 2012-04-10 LAB — SURGICAL PCR SCREEN: MRSA, PCR: NEGATIVE

## 2012-04-10 LAB — URINALYSIS, ROUTINE W REFLEX MICROSCOPIC
Leukocytes, UA: NEGATIVE
Protein, ur: NEGATIVE mg/dL
Urobilinogen, UA: 0.2 mg/dL (ref 0.0–1.0)

## 2012-04-10 LAB — TYPE AND SCREEN
ABO/RH(D): O POS
Antibody Screen: NEGATIVE

## 2012-04-10 NOTE — Progress Notes (Signed)
Consult with allison pa 

## 2012-04-10 NOTE — Anesthesia Preprocedure Evaluation (Addendum)
Anesthesia Evaluation  Patient identified by MRN, date of birth, ID band Patient awake    Reviewed: Allergy & Precautions, H&P , NPO status , Patient's Chart, lab work & pertinent test results  History of Anesthesia Complications (+) PONV  Airway Mallampati: II TM Distance: >3 FB Neck ROM: Full    Dental  (+) Teeth Intact and Dental Advisory Given   Pulmonary  breath sounds clear to auscultation        Cardiovascular Rhythm:Regular Rate:Normal     Neuro/Psych Anxiety Parkinsonism Chronic LBP,, neck pain  Neuromuscular disease    GI/Hepatic hiatal hernia, GERD-  Medicated,  Endo/Other    Renal/GU      Musculoskeletal   Abdominal Normal abdominal exam  (+)   Peds  Hematology   Anesthesia Other Findings   Reproductive/Obstetrics                          Anesthesia Physical Anesthesia Plan  ASA: II  Anesthesia Plan: General   Post-op Pain Management:    Induction: Intravenous  Airway Management Planned: Oral ETT  Additional Equipment:   Intra-op Plan:   Post-operative Plan: Extubation in OR  Informed Consent: I have reviewed the patients History and Physical, chart, labs and discussed the procedure including the risks, benefits and alternatives for the proposed anesthesia with the patient or authorized representative who has indicated his/her understanding and acceptance.   Dental advisory given  Plan Discussed with: Anesthesiologist and Surgeon  Anesthesia Plan Comments: (See my anesthesia note regarding anesthesia options and Parkinson's disease.  Shonna Chock, PA-C)       Anesthesia Quick Evaluation

## 2012-04-11 ENCOUNTER — Other Ambulatory Visit: Payer: Self-pay | Admitting: Orthopedic Surgery

## 2012-04-11 MED ORDER — CEFAZOLIN SODIUM-DEXTROSE 2-3 GM-% IV SOLR
2.0000 g | INTRAVENOUS | Status: AC
  Administered 2012-04-12: 2 g via INTRAVENOUS
  Filled 2012-04-11: qty 50

## 2012-04-11 NOTE — H&P (Signed)
Kristina Carr  DOB: [birth year] Married / Language: English / Race: White Female  Date of Admission:  04/12/2012  Chief Complaint:  Left Hip Pain  History of Present Illness The patient is a 67 year old female who comes in for a preoperative History and Physical. The patient is scheduled for a left total hip arthroplasty to be performed by Dr. Gus Rankin. Aluisio, MD at [facility] on 04/12/2012. The patient is a 67 year old female who presents today for follow up of their hip. The patient is being followed for their left hip pain and osteoarthritis. They are out from intra-articular injection with Dr. Ethelene Hal. Symptoms reported today include: pain. The patient feels that they are doing 50 percent better and report their pain level to be 5 / 10. The following medication has been used for pain control: antiinflammatory medication (Aleve). The patient has reported improvement of their symptoms with: Cortisone injections. The patient states that it took a few days for the cortisone injection to take effect, but it has helped her. She still has pain and stiffness, but it is not as bad as it was previously. She feels as though she is getting around a little better. Problems with the hip have occurred over the past year. She has had limited movement. She has Parkinson's disease and she is not sure how much of the stiffness is coming from that, and how much is coming from the hip. She only has significant stiffness around the left hip. She has had more difficulties with activities of daily living, such as putting on socks and shoes and getting up and down from a chair. She said the pain was rather severe prior to cortisone and has lessened it considerably. It has not eliminated the pain, though. From a functional standpoint she feels that the hip is definitely having a negative effect on her life style.  She is ready to proceed with surgery. Risks and benefits of the surgery have been  discussed with the patient and they elect to proceed with surgery.  There are on active contraindications to upcoming procedure such as ongoing infection or progressive neurological disease.    Problem List Osteoarthritis, hip (715.35)  Allergies PriLOSEC *ULCER DRUGS*   Family History Cancer. mother, father, grandmother mothers side and grandfather fathers side No pertinent family history   Social History Illicit drug use. no Living situation. live with spouse Marital status. married Exercise. Exercises rarely; does running / walking Current work status. disabled Drug/Alcohol Rehab (Currently). no Drug/Alcohol Rehab (Previously). no Tobacco use. never smoker; smoke(d) less than 1/2 pack(s) per day Number of flights of stairs before winded. 2-3 Pain Contract. no Tobacco / smoke exposure. no Children. 1 Alcohol use. current drinker; drinks wine; only occasionally per week Post-Surgical Plans. Plan is for home.   Past Surgical History Spinal Surgery Cesarean Delivery. 1 time Breast Biopsy. right Breast Mass; Local Excision. right Cataract Surgery. right  Medical History Parkinson's Disease Irritable bowel syndrome Anxiety Disorder Cancer. Bladder Gastroesophageal Reflux Disease Hiatal Hernia Degenerative Disc Disease Measles Mumps Atypical chest pain (786.59) Hyperlipemia (272.4) Paralysis agitans (332.0)  Review of Systems General:Not Present- Chills, Fever, Night Sweats, Fatigue, Weight Gain, Weight Loss and Memory Loss. Skin:Not Present- Hives, Itching, Rash, Eczema and Lesions. HEENT:Not Present- Tinnitus, Headache, Double Vision, Visual Loss, Hearing Loss and Dentures. Respiratory:Not Present- Shortness of breath with exertion, Shortness of breath at rest, Allergies, Coughing up blood and Chronic Cough. Cardiovascular:Present- Chest Pain and Palpitations. Not Present- Racing/skipping heartbeats, Difficulty  Breathing Lying  Down, Murmur and Swelling. Gastrointestinal:Present- Heartburn and Constipation. Not Present- Bloody Stool, Abdominal Pain, Vomiting, Nausea, Diarrhea, Difficulty Swallowing, Jaundice and Loss of appetitie. Female Genitourinary:Not Present- Blood in Urine, Urinary frequency, Weak urinary stream, Discharge, Flank Pain, Incontinence, Painful Urination, Urgency, Urinary Retention and Urinating at Night. Musculoskeletal:Present- Muscle Weakness, Muscle Pain, Back Pain and Morning Stiffness. Not Present- Joint Swelling, Joint Pain and Spasms. Neurological:Not Present- Tremor, Dizziness, Blackout spells, Paralysis, Difficulty with balance and Weakness. Psychiatric:Present- Memory Loss. Not Present- Insomnia.   Vitals Height: 64 in Height was reported by patient. Pulse: 76 (Regular) Resp.: 14 (Unlabored) BP: 126/72 (Sitting, Right Arm, Standard)    Physical Exam The physical exam findings are as follows:  Note: Patinet is a 67 year old female with continued severe left hip pain. Patient is accompanied today by her husband [name] and her sister [name].   General Mental Status - Alert and cooperative. General Appearance- pleasant and Anxious. Not in acute distress. Orientation- Oriented X3. Build & Nutrition- Well nourished and Well developed.   Head and Neck Head- normocephalic, atraumatic . Neck Global Assessment- supple. no bruit auscultated on the right and no bruit auscultated on the left.   Eye Pupil- Bilateral- Regular and Round. Motion- Bilateral- EOMI.   Chest and Lung Exam Auscultation: Breath sounds:- clear at anterior chest wall and - clear at posterior chest wall. Adventitious sounds:- No Adventitious sounds.   Cardiovascular Auscultation:Rhythm- Regular rate and rhythm. Heart Sounds- S1 WNL and S2 WNL. Murmurs & Other Heart Sounds:Auscultation of the heart reveals - No Murmurs.   Abdomen Palpation/Percussion:Tenderness-  Abdomen is non-tender to palpation. Rigidity (guarding)- Abdomen is soft. Auscultation:Auscultation of the abdomen reveals - Bowel sounds normal.   Female Genitourinary  Not done, not pertinent to present illness  Musculoskeletal On exam well developed female, alert and oriented in no apparent distress. Her right hip shows normal range of motion and no discomfort. The left hip flexion to 100, bout 10 internal rotation, 10-15 external rotation, about 20-30 abduction. She walks with a significantly antalgic gait pattern.  RADIOGRAPHS: Her radiographs, AP and lateral of the left hip from Dr. Barbaraann Faster office in October shows severe bone on bone arthritis. She has varus femoral neck. The arthritis is rather severe with some bony erosions and spurs.  Assessment & Plan Osteoarthritis, hip (715.35) Impression: Left Hip  Note: Plan is for a Left Total Hip Replacement by Dr. Lequita Halt.  Plan is to go home.  PCP - D. Hopper - Patient has been seen preoperatively and felt to be stable for surgery.  Signed electronically by Roberts Gaudy, PA-C

## 2012-04-12 ENCOUNTER — Inpatient Hospital Stay (HOSPITAL_COMMUNITY): Payer: BC Managed Care – PPO | Admitting: Vascular Surgery

## 2012-04-12 ENCOUNTER — Encounter (HOSPITAL_COMMUNITY): Payer: Self-pay | Admitting: Vascular Surgery

## 2012-04-12 ENCOUNTER — Inpatient Hospital Stay (HOSPITAL_COMMUNITY): Payer: BC Managed Care – PPO

## 2012-04-12 ENCOUNTER — Encounter (HOSPITAL_COMMUNITY): Payer: Self-pay | Admitting: *Deleted

## 2012-04-12 ENCOUNTER — Inpatient Hospital Stay (HOSPITAL_COMMUNITY)
Admission: RE | Admit: 2012-04-12 | Discharge: 2012-04-17 | DRG: 818 | Disposition: A | Discharge status: Skilled Nursing Facility | Payer: BC Managed Care – PPO | Source: Ambulatory Visit | Attending: Orthopedic Surgery | Admitting: Orthopedic Surgery

## 2012-04-12 ENCOUNTER — Encounter (HOSPITAL_COMMUNITY)
Admission: RE | Disposition: A | Discharge status: Skilled Nursing Facility | Payer: Self-pay | Source: Ambulatory Visit | Attending: Orthopedic Surgery

## 2012-04-12 DIAGNOSIS — Z79899 Other long term (current) drug therapy: Secondary | ICD-10-CM

## 2012-04-12 DIAGNOSIS — M169 Osteoarthritis of hip, unspecified: Secondary | ICD-10-CM | POA: Diagnosis present

## 2012-04-12 DIAGNOSIS — Z01818 Encounter for other preprocedural examination: Secondary | ICD-10-CM

## 2012-04-12 DIAGNOSIS — M1610 Unilateral primary osteoarthritis, unspecified hip: Principal | ICD-10-CM | POA: Diagnosis present

## 2012-04-12 DIAGNOSIS — Z8551 Personal history of malignant neoplasm of bladder: Secondary | ICD-10-CM

## 2012-04-12 DIAGNOSIS — K219 Gastro-esophageal reflux disease without esophagitis: Secondary | ICD-10-CM | POA: Diagnosis present

## 2012-04-12 DIAGNOSIS — E785 Hyperlipidemia, unspecified: Secondary | ICD-10-CM | POA: Diagnosis present

## 2012-04-12 DIAGNOSIS — K449 Diaphragmatic hernia without obstruction or gangrene: Secondary | ICD-10-CM | POA: Diagnosis present

## 2012-04-12 DIAGNOSIS — G20 Parkinson's disease: Secondary | ICD-10-CM | POA: Diagnosis present

## 2012-04-12 DIAGNOSIS — K589 Irritable bowel syndrome without diarrhea: Secondary | ICD-10-CM | POA: Diagnosis present

## 2012-04-12 DIAGNOSIS — F411 Generalized anxiety disorder: Secondary | ICD-10-CM | POA: Diagnosis present

## 2012-04-12 DIAGNOSIS — G20A1 Parkinson's disease without dyskinesia, without mention of fluctuations: Secondary | ICD-10-CM | POA: Diagnosis present

## 2012-04-12 DIAGNOSIS — Z01812 Encounter for preprocedural laboratory examination: Secondary | ICD-10-CM

## 2012-04-12 HISTORY — PX: TOTAL HIP ARTHROPLASTY: SHX124

## 2012-04-12 SURGERY — ARTHROPLASTY, HIP, TOTAL, ANTERIOR APPROACH
Anesthesia: General | Site: Hip | Laterality: Left | Wound class: Clean

## 2012-04-12 MED ORDER — LIDOCAINE HCL (CARDIAC) 20 MG/ML IV SOLN
INTRAVENOUS | Status: DC | PRN
  Administered 2012-04-12: 100 mg via INTRAVENOUS

## 2012-04-12 MED ORDER — LACTATED RINGERS IV SOLN
INTRAVENOUS | Status: DC | PRN
  Administered 2012-04-12 (×2): via INTRAVENOUS

## 2012-04-12 MED ORDER — PROMETHAZINE HCL 25 MG/ML IJ SOLN: 6.2500 mg | INTRAMUSCULAR | Status: DC | PRN

## 2012-04-12 MED ORDER — ONDANSETRON HCL 4 MG/2ML IJ SOLN
INTRAMUSCULAR | Status: DC | PRN
  Administered 2012-04-12: 4 mg via INTRAVENOUS

## 2012-04-12 MED ORDER — POLYETHYLENE GLYCOL 3350 17 G PO PACK: 17.0000 g | PACK | Freq: Every day | ORAL | Status: DC | PRN

## 2012-04-12 MED ORDER — ROCURONIUM BROMIDE 100 MG/10ML IV SOLN
INTRAVENOUS | Status: DC | PRN
  Administered 2012-04-12: 50 mg via INTRAVENOUS
  Administered 2012-04-12: 5 mg via INTRAVENOUS

## 2012-04-12 MED ORDER — BUPIVACAINE LIPOSOME 1.3 % IJ SUSP
20.0000 mL | Freq: Once | INTRAMUSCULAR | Status: AC
  Administered 2012-04-12: 70 mL
  Filled 2012-04-12: qty 20

## 2012-04-12 MED ORDER — ACETAMINOPHEN 650 MG RE SUPP: 650.0000 mg | Freq: Four times a day (QID) | RECTAL | Status: DC | PRN

## 2012-04-12 MED ORDER — DIAZEPAM 5 MG/ML IJ SOLN
INTRAMUSCULAR | Status: AC
  Administered 2012-04-12: 3 mg
  Filled 2012-04-12: qty 2

## 2012-04-12 MED ORDER — DEXAMETHASONE SODIUM PHOSPHATE 10 MG/ML IJ SOLN
10.0000 mg | Freq: Once | INTRAMUSCULAR | Status: AC
  Filled 2012-04-12: qty 1

## 2012-04-12 MED ORDER — GLYCOPYRROLATE 0.2 MG/ML IJ SOLN
INTRAMUSCULAR | Status: DC | PRN
  Administered 2012-04-12: .2 mg via INTRAVENOUS

## 2012-04-12 MED ORDER — OXYCODONE HCL 5 MG/5ML PO SOLN: 5.0000 mg | Freq: Once | ORAL | Status: DC | PRN

## 2012-04-12 MED ORDER — METHOCARBAMOL 100 MG/ML IJ SOLN
500.0000 mg | Freq: Four times a day (QID) | INTRAVENOUS | Status: DC | PRN
  Filled 2012-04-12: qty 5

## 2012-04-12 MED ORDER — DIPHENHYDRAMINE HCL 12.5 MG/5ML PO ELIX: 12.5000 mg | ORAL_SOLUTION | ORAL | Status: DC | PRN

## 2012-04-12 MED ORDER — NEOSTIGMINE METHYLSULFATE 1 MG/ML IJ SOLN
INTRAMUSCULAR | Status: DC | PRN
  Administered 2012-04-12: 1.5 mg via INTRAVENOUS

## 2012-04-12 MED ORDER — MORPHINE SULFATE 2 MG/ML IJ SOLN
1.0000 mg | INTRAMUSCULAR | Status: DC | PRN
  Administered 2012-04-12: 2 mg via INTRAVENOUS
  Administered 2012-04-12: 1 mg via INTRAVENOUS
  Administered 2012-04-13: 2 mg via INTRAVENOUS
  Administered 2012-04-13: 1 mg via INTRAVENOUS
  Filled 2012-04-12 (×4): qty 1

## 2012-04-12 MED ORDER — MIDAZOLAM HCL 2 MG/2ML IJ SOLN: 1.0000 mg | INTRAMUSCULAR | Status: DC | PRN

## 2012-04-12 MED ORDER — MENTHOL 3 MG MT LOZG: 1.0000 | LOZENGE | OROMUCOSAL | Status: DC | PRN

## 2012-04-12 MED ORDER — HYDROMORPHONE HCL PF 1 MG/ML IJ SOLN
INTRAMUSCULAR | Status: AC
  Filled 2012-04-12: qty 2

## 2012-04-12 MED ORDER — METOCLOPRAMIDE HCL 5 MG/ML IJ SOLN: 5.0000 mg | Freq: Three times a day (TID) | INTRAMUSCULAR | Status: DC | PRN

## 2012-04-12 MED ORDER — HYDROMORPHONE HCL PF 1 MG/ML IJ SOLN
0.2500 mg | INTRAMUSCULAR | Status: DC | PRN
  Administered 2012-04-12 (×6): 0.5 mg via INTRAVENOUS

## 2012-04-12 MED ORDER — ACETAMINOPHEN 325 MG PO TABS
650.0000 mg | ORAL_TABLET | Freq: Four times a day (QID) | ORAL | Status: DC | PRN
  Administered 2012-04-14: 650 mg via ORAL
  Filled 2012-04-12 (×2): qty 2

## 2012-04-12 MED ORDER — CARBIDOPA-LEVODOPA 25-100 MG PO TABS
1.0000 | ORAL_TABLET | ORAL | Status: DC
  Administered 2012-04-12 – 2012-04-17 (×16): 1 via ORAL
  Filled 2012-04-12 (×20): qty 1

## 2012-04-12 MED ORDER — ONDANSETRON HCL 4 MG PO TABS: 4.0000 mg | ORAL_TABLET | Freq: Four times a day (QID) | ORAL | Status: DC | PRN

## 2012-04-12 MED ORDER — FENTANYL CITRATE 0.05 MG/ML IJ SOLN: 50.0000 ug | Freq: Once | INTRAMUSCULAR | Status: DC

## 2012-04-12 MED ORDER — RIVAROXABAN 10 MG PO TABS
10.0000 mg | ORAL_TABLET | Freq: Every day | ORAL | Status: DC
  Administered 2012-04-13 – 2012-04-17 (×5): 10 mg via ORAL
  Filled 2012-04-12 (×8): qty 1

## 2012-04-12 MED ORDER — FENTANYL CITRATE 0.05 MG/ML IJ SOLN
INTRAMUSCULAR | Status: AC
  Administered 2012-04-12: 100 ug via INTRAVENOUS
  Filled 2012-04-12: qty 2

## 2012-04-12 MED ORDER — PHENOL 1.4 % MT LIQD: 1.0000 | OROMUCOSAL | Status: DC | PRN

## 2012-04-12 MED ORDER — CEFAZOLIN SODIUM 1-5 GM-% IV SOLN
1.0000 g | Freq: Four times a day (QID) | INTRAVENOUS | Status: AC
  Administered 2012-04-12 (×2): 1 g via INTRAVENOUS
  Filled 2012-04-12 (×2): qty 50

## 2012-04-12 MED ORDER — PANTOPRAZOLE SODIUM 40 MG PO TBEC
40.0000 mg | DELAYED_RELEASE_TABLET | Freq: Every day | ORAL | Status: DC
  Administered 2012-04-13 – 2012-04-17 (×5): 40 mg via ORAL
  Filled 2012-04-12 (×5): qty 1

## 2012-04-12 MED ORDER — OXYCODONE HCL 5 MG PO TABS
5.0000 mg | ORAL_TABLET | ORAL | Status: DC | PRN
  Administered 2012-04-13: 5 mg via ORAL
  Administered 2012-04-13 – 2012-04-14 (×6): 10 mg via ORAL
  Administered 2012-04-15: 5 mg via ORAL
  Administered 2012-04-15 (×3): 10 mg via ORAL
  Administered 2012-04-16 (×3): 5 mg via ORAL
  Administered 2012-04-16 (×2): 10 mg via ORAL
  Administered 2012-04-16 – 2012-04-17 (×5): 5 mg via ORAL
  Filled 2012-04-12: qty 2
  Filled 2012-04-12: qty 1
  Filled 2012-04-12: qty 2
  Filled 2012-04-12: qty 1
  Filled 2012-04-12: qty 2
  Filled 2012-04-12 (×5): qty 1
  Filled 2012-04-12: qty 2
  Filled 2012-04-12 (×2): qty 1
  Filled 2012-04-12 (×5): qty 2
  Filled 2012-04-12: qty 1
  Filled 2012-04-12 (×2): qty 2

## 2012-04-12 MED ORDER — PHENYLEPHRINE HCL 10 MG/ML IJ SOLN
INTRAMUSCULAR | Status: DC | PRN
  Administered 2012-04-12: 80 ug via INTRAVENOUS

## 2012-04-12 MED ORDER — HYDROMORPHONE HCL PF 1 MG/ML IJ SOLN
INTRAMUSCULAR | Status: AC
  Filled 2012-04-12: qty 1

## 2012-04-12 MED ORDER — DEXAMETHASONE SODIUM PHOSPHATE 4 MG/ML IJ SOLN
INTRAMUSCULAR | Status: DC | PRN
  Administered 2012-04-12: 8 mg via INTRAVENOUS

## 2012-04-12 MED ORDER — ROPINIROLE HCL ER 12 MG PO TB24
2.0000 | ORAL_TABLET | Freq: Every day | ORAL | Status: DC
  Administered 2012-04-12 – 2012-04-16 (×5): 24 mg via ORAL
  Filled 2012-04-12 (×4): qty 2

## 2012-04-12 MED ORDER — OXYBUTYNIN CHLORIDE ER 10 MG PO TB24
10.0000 mg | ORAL_TABLET | Freq: Every day | ORAL | Status: DC
  Administered 2012-04-12 – 2012-04-17 (×6): 10 mg via ORAL
  Filled 2012-04-12 (×7): qty 1

## 2012-04-12 MED ORDER — DOCUSATE SODIUM 100 MG PO CAPS
100.0000 mg | ORAL_CAPSULE | Freq: Two times a day (BID) | ORAL | Status: DC
  Administered 2012-04-12 – 2012-04-17 (×10): 100 mg via ORAL
  Filled 2012-04-12 (×11): qty 1

## 2012-04-12 MED ORDER — ZOLPIDEM TARTRATE 5 MG PO TABS: 10.0000 mg | ORAL_TABLET | Freq: Every evening | ORAL | Status: DC | PRN

## 2012-04-12 MED ORDER — ACETAMINOPHEN 10 MG/ML IV SOLN
1000.0000 mg | Freq: Four times a day (QID) | INTRAVENOUS | Status: AC
  Administered 2012-04-12 – 2012-04-13 (×4): 1000 mg via INTRAVENOUS
  Filled 2012-04-12 (×4): qty 100

## 2012-04-12 MED ORDER — OXYCODONE HCL 5 MG PO TABS: 5.0000 mg | ORAL_TABLET | Freq: Once | ORAL | Status: DC | PRN

## 2012-04-12 MED ORDER — FENTANYL CITRATE 0.05 MG/ML IJ SOLN
INTRAMUSCULAR | Status: DC | PRN
  Administered 2012-04-12: 100 ug via INTRAVENOUS
  Administered 2012-04-12 (×4): 50 ug via INTRAVENOUS

## 2012-04-12 MED ORDER — METOCLOPRAMIDE HCL 10 MG PO TABS: 5.0000 mg | ORAL_TABLET | Freq: Three times a day (TID) | ORAL | Status: DC | PRN

## 2012-04-12 MED ORDER — ESCITALOPRAM OXALATE 20 MG PO TABS
20.0000 mg | ORAL_TABLET | Freq: Every day | ORAL | Status: DC
  Administered 2012-04-12 – 2012-04-17 (×6): 20 mg via ORAL
  Filled 2012-04-12 (×6): qty 1

## 2012-04-12 MED ORDER — BISACODYL 10 MG RE SUPP: 10.0000 mg | Freq: Every day | RECTAL | Status: DC | PRN

## 2012-04-12 MED ORDER — ZOLPIDEM TARTRATE 5 MG PO TABS: 5.0000 mg | ORAL_TABLET | Freq: Every evening | ORAL | Status: DC | PRN

## 2012-04-12 MED ORDER — LIDOCAINE HCL 4 % MT SOLN
OROMUCOSAL | Status: DC | PRN
  Administered 2012-04-12: 4 mL via TOPICAL

## 2012-04-12 MED ORDER — FLEET ENEMA 7-19 GM/118ML RE ENEM: 1.0000 | ENEMA | Freq: Once | RECTAL | Status: AC | PRN

## 2012-04-12 MED ORDER — PROPOFOL 10 MG/ML IV BOLUS
INTRAVENOUS | Status: DC | PRN
  Administered 2012-04-12: 200 mg via INTRAVENOUS

## 2012-04-12 MED ORDER — METHOCARBAMOL 500 MG PO TABS
500.0000 mg | ORAL_TABLET | Freq: Four times a day (QID) | ORAL | Status: DC | PRN
  Administered 2012-04-12: 500 mg via ORAL
  Filled 2012-04-12 (×2): qty 1

## 2012-04-12 MED ORDER — ONDANSETRON HCL 4 MG/2ML IJ SOLN: 4.0000 mg | Freq: Four times a day (QID) | INTRAMUSCULAR | Status: DC | PRN

## 2012-04-12 MED ORDER — 0.9 % SODIUM CHLORIDE (POUR BTL) OPTIME
TOPICAL | Status: DC | PRN
  Administered 2012-04-12: 1000 mL

## 2012-04-12 MED ORDER — DEXTROSE-NACL 5-0.9 % IV SOLN
INTRAVENOUS | Status: DC
  Administered 2012-04-12 – 2012-04-13 (×3): via INTRAVENOUS

## 2012-04-12 MED ORDER — TRAMADOL HCL 50 MG PO TABS: 50.0000 mg | ORAL_TABLET | Freq: Four times a day (QID) | ORAL | Status: DC | PRN

## 2012-04-12 MED ORDER — DEXAMETHASONE 4 MG PO TABS
10.0000 mg | ORAL_TABLET | Freq: Once | ORAL | Status: AC
  Administered 2012-04-13: 10 mg via ORAL
  Filled 2012-04-12 (×2): qty 1

## 2012-04-12 MED ORDER — SODIUM CHLORIDE 0.9 % IJ SOLN
INTRAMUSCULAR | Status: AC
  Filled 2012-04-12: qty 15

## 2012-04-12 SURGICAL SUPPLY — 40 items
BLADE SAW SGTL 18X1.27X75 (BLADE) ×2 IMPLANT
CLOSURE STERI-STRIP 1/4X4 (GAUZE/BANDAGES/DRESSINGS) ×2 IMPLANT
CLOTH BEACON ORANGE TIMEOUT ST (SAFETY) ×2 IMPLANT
CLSR STERI-STRIP ANTIMIC 1/2X4 (GAUZE/BANDAGES/DRESSINGS) ×4 IMPLANT
DECANTER SPIKE VIAL GLASS SM (MISCELLANEOUS) ×2 IMPLANT
DRAPE C-ARM 42X72 X-RAY (DRAPES) ×2 IMPLANT
DRAPE STERI IOBAN 125X83 (DRAPES) ×2 IMPLANT
DRAPE U-SHAPE 47X51 STRL (DRAPES) ×6 IMPLANT
DRSG ADAPTIC 3X8 NADH LF (GAUZE/BANDAGES/DRESSINGS) ×2 IMPLANT
DRSG MEPILEX BORDER 4X4 (GAUZE/BANDAGES/DRESSINGS) IMPLANT
DRSG MEPILEX BORDER 4X8 (GAUZE/BANDAGES/DRESSINGS) ×2 IMPLANT
DURAPREP 26ML APPLICATOR (WOUND CARE) ×2 IMPLANT
ELECT BLADE 6.5 EXT (BLADE) ×2 IMPLANT
ELECT REM PT RETURN 9FT ADLT (ELECTROSURGICAL) ×2
ELECTRODE REM PT RTRN 9FT ADLT (ELECTROSURGICAL) ×1 IMPLANT
EVACUATOR 1/8 PVC DRAIN (DRAIN) IMPLANT
FACESHIELD LNG OPTICON STERILE (SAFETY) ×8 IMPLANT
GLOVE BIO SURGEON STRL SZ8 (GLOVE) ×4 IMPLANT
GLOVE BIOGEL PI IND STRL 8 (GLOVE) ×1 IMPLANT
GLOVE BIOGEL PI INDICATOR 8 (GLOVE) ×1
GLOVE ECLIPSE 8.0 STRL XLNG CF (GLOVE) ×2 IMPLANT
GOWN BRE IMP PREV XXLGXLNG (GOWN DISPOSABLE) ×4 IMPLANT
GOWN STRL NON-REIN LRG LVL3 (GOWN DISPOSABLE) ×2 IMPLANT
KIT BASIN OR (CUSTOM PROCEDURE TRAY) ×2 IMPLANT
NDL SAFETY ECLIPSE 18X1.5 (NEEDLE) ×1 IMPLANT
NEEDLE HYPO 18GX1.5 SHARP (NEEDLE) ×1
PACK TOTAL JOINT (CUSTOM PROCEDURE TRAY) ×2 IMPLANT
PADDING CAST COTTON 6X4 STRL (CAST SUPPLIES) ×2 IMPLANT
SPONGE GAUZE 4X4 12PLY (GAUZE/BANDAGES/DRESSINGS) ×2 IMPLANT
SUCTION FRAZIER TIP 10 FR DISP (SUCTIONS) ×2 IMPLANT
SUT ETHIBOND NAB CT1 #1 30IN (SUTURE) ×6 IMPLANT
SUT MNCRL AB 4-0 PS2 18 (SUTURE) ×2 IMPLANT
SUT VIC AB 1 CT1 27 (SUTURE) ×1
SUT VIC AB 1 CT1 27XBRD ANTBC (SUTURE) ×1 IMPLANT
SUT VIC AB 2-0 CT1 27 (SUTURE) ×2
SUT VIC AB 2-0 CT1 TAPERPNT 27 (SUTURE) ×2 IMPLANT
SUT VLOC 180 0 24IN GS25 (SUTURE) ×2 IMPLANT
SYR 50ML LL SCALE MARK (SYRINGE) ×2 IMPLANT
TOWEL OR 17X26 10 PK STRL BLUE (TOWEL DISPOSABLE) ×4 IMPLANT
TRAY FOLEY CATH 14FRSI W/METER (CATHETERS) ×2 IMPLANT

## 2012-04-12 NOTE — Op Note (Signed)
OPERATIVE REPORT  PREOPERATIVE DIAGNOSIS: Osteoarthritis of the Left hip.   POSTOPERATIVE DIAGNOSIS: Osteoarthritis of the Left  hip.   PROCEDURE: Left total hip arthroplasty, anterior approach.   SURGEON: Ollen Gross, MD   ASSISTANT: Avel Peace, PA-C  ANESTHESIA:  General  ESTIMATED BLOOD LOSS:-600 ml  DRAINS: Hemovac x1.   COMPLICATIONS: None   CONDITION: PACU - hemodynamically stable.   BRIEF CLINICAL NOTE: Kristina Carr is a 67 y.o. female who has advanced end-  stage arthritis of his Left  hip with progressively worsening pain and  dysfunction.The patient has failed nonoperative management and presents for  total hip arthroplasty.   PROCEDURE IN DETAIL: After successful administration of spinal  anesthetic, the traction boots for the [facility] bed were placed on both  feet and the patient was placed onto the [facility] bed, boots placed into the leg  holders. The Left hip was then isolated from the perineum with plastic  drapes and prepped and draped in the usual sterile fashion. ASIS and  greater trochanter were marked and a oblique incision was made, starting  at about 1 cm lateral and 2 cm distal to the ASIS and coursing towards  the anterior cortex of the femur. The skin was cut with a 10 blade  through subcutaneous tissue to the level of the fascia overlying the  tensor fascia lata muscle. The fascia was then incised in line with the  incision at the junction of the anterior third and posterior 2/3rd. The  muscle was teased off the fascia and then the interval between the TFL  and the rectus was developed. The Hohmann retractor was then placed at  the top of the femoral neck over the capsule. The vessels overlying the  capsule were cauterized and the fat on top of the capsule was removed.  A Hohmann retractor was then placed anterior underneath the rectus  femoris to give exposure to the entire anterior capsule. A T-shaped  capsulotomy was performed.  The edges were tagged and the femoral head  was identified.       Osteophytes are removed off the superior acetabulum.  The femoral neck was then cut in situ with an oscillating saw. Traction  was then applied to the left lower extremity utilizing the [facility]  traction. The femoral head was then removed. Retractors were placed  around the acetabulum and then circumferential removal of the labrum was  performed. Osteophytes were also removed. Reaming starts at 43 mm to  medialize and  Increased in 2 mm increments to 49 mm. We reamed in  approximately 40 degrees of abduction, 20 degrees anteversion. A 50 mm  pinnacle acetabular shell was then impacted in anatomic position under  fluoroscopic guidance with excellent purchase. We did not need to place  any additional dome screws. A 32 mm neutral + 4 marathon liner was then  placed into the acetabular shell.       The femoral lift was then placed along the lateral aspect of the femur  just distal to the vastus ridge. The leg was  externally rotated and capsule  was stripped off the inferior aspect of the femoral neck down to the  level of the lesser trochanter, this was done with electrocautery. The femur was lifted after this was performed. The  leg was then placed and extended in adducted position to essentially delivering the femur. We also removed the capsule superiorly and the  piriformis from the piriformis fossa  to gain excellent exposure of the  proximal femur. Rongeur was used to remove some cancellous bone to get  into the lateral portion of the proximal femur for placement of the  initial starter reamer. The starter broaches was placed  the starter broach  and was shown to go down the center of the canal. Broaching  with the  Corail system was then performed starting at size 8, coursing  Up to size 10. A size 10 had excellent torsional and rotational  and axial stability. The trial high offset neck was then placed  with a 32 +5 trial  head. The hip was then reduced. We confirmed that  the stem was in the canal both on AP and lateral x-rays. It also has excellent sizing. The hip was reduced with outstanding stability through full extension, full external rotation,  and then flexion in adduction internal rotation. AP pelvis was taken  and the leg lengths were measured and found to be exactly equal. Hip  was then dislocated again and the femoral head and neck removed. The  femoral broach was removed. Size 10 Corail stem with a standard offset  neck was then impacted into the femur following native anteversion. Has  excellent purchase in the canal. Excellent torsional and rotational and  axial stability. It is confirmed to be in the canal on AP and lateral  fluoroscopic views. The 32 + 5 ceramic head was placed and the hip  reduced with outstanding stability. Again AP pelvis was taken and it  confirmed that the leg lengths were equal. The wound was then copiously  irrigated with saline solution and the capsule reattached and repaired  with Ethibond suture.  20 mL of Exparel mixed with 50 mL of saline injected  into the capsule and into the edge of the tensor fascia lata as well as  subcutaneous tissue. The fascia overlying the tensor fascia lata was  then closed with a running #1 V-Loc. Subcu was closed with interrupted  2-0 Vicryl and subcuticular running 4-0 Monocryl. Incision was cleaned  and dried. Steri-Strips and a bulky sterile dressing applied. Hemovac  drain was hooked to suction and then he was awakened and transported to  recovery in stable condition.        Please note that a surgical assistant was a medical necessity for this procedure to perform it in a safe and expeditious manner. Assistant was necessary to provide appropriate retraction of vital neurovascular structures and to prevent femoral fracture and allow for anatomic placement of the prosthesis.  Ollen Gross, M.D.

## 2012-04-12 NOTE — Plan of Care (Signed)
Problem: Consults Goal: Diagnosis- Total Joint Replacement Primary Total Hip Left     

## 2012-04-12 NOTE — Evaluation (Signed)
Physical Therapy Evaluation Patient Details Name: Kristina Carr MRN: [id number] DOB: August 13, 1945 Today's Date: 04/12/2012 Time: 3086-5784 PT Time Calculation (min): 30 min  PT Assessment / Plan / Recommendation Clinical Impression  Patient is a 67 yo female s/p Lt. THA.  Will benefit from acute PT to maximize independence prior to return home with husband.  Recommend HHPT at discharge for continued therapy.    PT Assessment  Patient needs continued PT services    Follow Up Recommendations  Home health PT;Supervision/Assistance - 24 hour    Does the patient have the potential to tolerate intense rehabilitation      Barriers to Discharge None      Equipment Recommendations   (3-in-1 BSC)    Recommendations for Other Services     Frequency 7X/week    Precautions / Restrictions Precautions Precautions: None Restrictions Weight Bearing Restrictions: Yes LLE Weight Bearing: Weight bearing as tolerated   Pertinent Vitals/Pain       Mobility  Bed Mobility Bed Mobility: Supine to Sit;Sitting - Scoot to Edge of Bed Supine to Sit: 4: Min assist;HOB elevated Sitting - Scoot to [place] of Bed: 4: Min guard Details for Bed Mobility Assistance: Verbal cues for technique.  Assist to move LLE off of bed.  Patient sat at EOB x 7 minutes with good balance. Transfers Transfers: Sit to Stand;Stand to [employer] Transfers Sit to Stand: 3: Mod assist;With upper extremity assist;From bed Stand to Sit: 4: Min assist;With upper extremity assist;With armrests;To chair/3-in-1 Stand Pivot Transfers: 3: Mod assist Details for Transfer Assistance: Verbal cues for hand placement, placement of LLE, and technique during transfers.  Assist to rise to standing.  Able to take several steps to pivot to chair. Ambulation/Gait Ambulation/Gait Assistance: Not tested (comment)    Exercises Total Joint Exercises Ankle Circles/Pumps: AROM;Both;10 reps;Seated   PT Diagnosis: Difficulty  walking;Generalized weakness;Acute pain  PT Problem List: Decreased strength;Decreased range of motion;Decreased activity tolerance;Decreased balance;Decreased mobility;Decreased knowledge of use of DME;Pain PT Treatment Interventions: DME instruction;Gait training;Functional mobility training;Therapeutic exercise;Patient/family education   PT Goals Acute Rehab PT Goals PT Goal Formulation: With patient/family Time For Goal Achievement: 04/19/12 Potential to Achieve Goals: Good Pt will go Supine/Side to Sit: with supervision;with HOB 0 degrees PT Goal: Supine/Side to Sit - Progress: Goal set today Pt will go Sit to Supine/Side: with supervision;with HOB 0 degrees PT Goal: Sit to Supine/Side - Progress: Goal set today Pt will go Sit to Stand: with supervision;with upper extremity assist PT Goal: Sit to Stand - Progress: Goal set today Pt will Ambulate: 51 - 150 feet;with supervision;with rolling walker PT Goal: Ambulate - Progress: Goal set today Pt will Perform Home Exercise Program: with supervision, verbal cues required/provided PT Goal: Perform Home Exercise Program - Progress: Goal set today  Visit Information  Last PT Received On: 04/12/12 Assistance Needed: +1    Subjective Data  Subjective: "I'm having a tough time with pain right now." Patient Stated Goal: To return home.   Prior Functioning  Home Living Lives With: Spouse Available Help at Discharge: Family;Available 24 hours/day Type of Home: House Home Access: Level entry Home Layout: Two level;Able to live on main level with bedroom/bathroom Bathroom Shower/Tub: Tub/shower unit;Walk-in shower (walk-in shower on second floor) Bathroom Toilet: Standard Bathroom Accessibility: Yes How Accessible: Accessible via walker Home Adaptive Equipment: Walker - rolling;Straight cane Prior Function Level of Independence: Independent Able to Take Stairs?: Yes Driving: Yes Vocation: Retired Musician: No  difficulties    Copywriter, advertising Overall  Cognitive Status: Appears within functional limits for tasks assessed/performed Arousal/Alertness: Awake/alert Orientation Level: Appears intact for tasks assessed Behavior During Session: [facility] for tasks performed    Extremity/Trunk Assessment Right Upper Extremity Assessment RUE ROM/Strength/Tone: WFL for tasks assessed RUE Sensation: WFL - Light Touch Left Upper Extremity Assessment LUE ROM/Strength/Tone: WFL for tasks assessed LUE Sensation: WFL - Light Touch Right Lower Extremity Assessment RLE ROM/Strength/Tone: WFL for tasks assessed RLE Sensation: WFL - Light Touch Left Lower Extremity Assessment LLE ROM/Strength/Tone: Deficits;Unable to fully assess;Due to pain LLE ROM/Strength/Tone Deficits: Able to assist with moving LLE off of bed   Balance Balance Balance Assessed: Yes Static Sitting Balance Static Sitting - Balance Support: Right upper extremity supported;Feet supported Static Sitting - Level of Assistance: 5: Stand by assistance Static Sitting - Comment/# of Minutes: 7  End of Session PT - End of Session Equipment Utilized During Treatment: Gait belt;Oxygen Activity Tolerance: Patient limited by pain;Patient limited by fatigue Patient left: in chair;with call bell/phone within reach;with family/visitor present Nurse Communication: Mobility status  GP     Vena Austria 04/12/2012, 3:23 PM Durenda Hurt. Renaldo Fiddler, [facility] Acute Rehab Services Pager [phone]

## 2012-04-12 NOTE — Progress Notes (Signed)
Utilization review completed.  P.J. Minnich,RN,BSN Case Manager [phone]  

## 2012-04-12 NOTE — Progress Notes (Signed)
Orthopedic Tech Progress Note Patient Details:  Kristina Carr 02/02/1946 [id number]  Patient ID: Kristina Carr, female   DOB: [birth year], 67 y.o.   MRN: [id number] Trapeze bar patient helper; viewed order from doctor's order list  Nikki Dom 04/12/2012, 4:04 PM

## 2012-04-12 NOTE — Interval H&P Note (Signed)
History and Physical Interval Note:  04/12/2012 7:16 AM  Kristina Carr  has presented today for surgery, with the diagnosis of oa left hip   The various methods of treatment have been discussed with the patient and family. After consideration of risks, benefits and other options for treatment, the patient has consented to  Procedure(s): TOTAL HIP ARTHROPLASTY ANTERIOR APPROACH (Left) as a surgical intervention .  The patient's history has been reviewed, patient examined, no change in status, stable for surgery.  I have reviewed the patient's chart and labs.  Questions were answered to the patient's satisfaction.     Loanne Drilling

## 2012-04-12 NOTE — Progress Notes (Signed)
Patient is insistent on bending her knees up.  Rationale fully explained to patient for NOT bending the knee.  Dangers of bending the knee fully explained to patient.  Patient voices understanding of explanation.

## 2012-04-12 NOTE — Transfer of Care (Signed)
Immediate Anesthesia Transfer of Care Note  Patient: Kristina Carr  Procedure(s) Performed: Procedure(s): TOTAL HIP ARTHROPLASTY ANTERIOR APPROACH (Left)  Patient Location: PACU  Anesthesia Type:General  Level of Consciousness: sedated  Airway & Oxygen Therapy: Patient Spontanous Breathing and Patient connected to nasal cannula oxygen  Post-op Assessment: Report given to PACU RN and Post -op Vital signs reviewed and stable  Post vital signs: Reviewed and stable  Complications: No apparent anesthesia complications

## 2012-04-12 NOTE — Preoperative (Signed)
Beta Blockers   Reason not to administer Beta Blockers:Not Applicable 

## 2012-04-12 NOTE — Anesthesia Postprocedure Evaluation (Signed)
  Anesthesia Post-op Note  Patient: Kristina Carr  Procedure(s) Performed: Procedure(s): TOTAL HIP ARTHROPLASTY ANTERIOR APPROACH (Left)  Patient Location: PACU  Anesthesia Type:General  Level of Consciousness: awake and alert   Airway and Oxygen Therapy: Patient Spontanous Breathing  Post-op Pain: mild  Post-op Assessment: Post-op Vital signs reviewed, Patient's Cardiovascular Status Stable, Respiratory Function Stable, Patent Airway, No signs of Nausea or vomiting and Pain level controlled  Post-op Vital Signs: stable  Complications: No apparent anesthesia complications

## 2012-04-12 NOTE — H&P (View-Only) (Signed)
Kristina Carr  DOB: 06/02/1945 Married / Language: English / Race: White Female  Date of Admission:  04/12/2012  Chief Complaint:  Left Hip Pain  History of Present Illness The patient is a 66 year old female who comes in for a preoperative History and Physical. The patient is scheduled for a left total hip arthroplasty to be performed by Dr. Frank V. Aluisio, MD at [facility] on 04/12/2012. The patient is a 66 year old female who presents today for follow up of their hip. The patient is being followed for their left hip pain and osteoarthritis. They are out from intra-articular injection with Dr. Ramos. Symptoms reported today include: pain. The patient feels that they are doing 50 percent better and report their pain level to be 5 / 10. The following medication has been used for pain control: antiinflammatory medication (Aleve). The patient has reported improvement of their symptoms with: Cortisone injections. The patient states that it took a few days for the cortisone injection to take effect, but it has helped her. She still has pain and stiffness, but it is not as bad as it was previously. She feels as though she is getting around a little better. Problems with the hip have occurred over the past year. She has had limited movement. She has Parkinson's disease and she is not sure how much of the stiffness is coming from that, and how much is coming from the hip. She only has significant stiffness around the left hip. She has had more difficulties with activities of daily living, such as putting on socks and shoes and getting up and down from a chair. She said the pain was rather severe prior to cortisone and has lessened it considerably. It has not eliminated the pain, though. From a functional standpoint she feels that the hip is definitely having a negative effect on her life style.  She is ready to proceed with surgery. Risks and benefits of the surgery have been  discussed with the patient and they elect to proceed with surgery.  There are on active contraindications to upcoming procedure such as ongoing infection or progressive neurological disease.    Problem List Osteoarthritis, hip (715.35)  Allergies PriLOSEC *ULCER DRUGS*   Family History Cancer. mother, father, grandmother mothers side and grandfather fathers side No pertinent family history   Social History Illicit drug use. no Living situation. live with spouse Marital status. married Exercise. Exercises rarely; does running / walking Current work status. disabled Drug/Alcohol Rehab (Currently). no Drug/Alcohol Rehab (Previously). no Tobacco use. never smoker; smoke(d) less than 1/2 pack(s) per day Number of flights of stairs before winded. 2-3 Pain Contract. no Tobacco / smoke exposure. no Children. 1 Alcohol use. current drinker; drinks wine; only occasionally per week Post-Surgical Plans. Plan is for home.   Past Surgical History Spinal Surgery Cesarean Delivery. 1 time Breast Biopsy. right Breast Mass; Local Excision. right Cataract Surgery. right  Medical History Parkinson's Disease Irritable bowel syndrome Anxiety Disorder Cancer. Bladder Gastroesophageal Reflux Disease Hiatal Hernia Degenerative Disc Disease Measles Mumps Atypical chest pain (786.59) Hyperlipemia (272.4) Paralysis agitans (332.0)  Review of Systems General:Not Present- Chills, Fever, Night Sweats, Fatigue, Weight Gain, Weight Loss and Memory Loss. Skin:Not Present- Hives, Itching, Rash, Eczema and Lesions. HEENT:Not Present- Tinnitus, Headache, Double Vision, Visual Loss, Hearing Loss and Dentures. Respiratory:Not Present- Shortness of breath with exertion, Shortness of breath at rest, Allergies, Coughing up blood and Chronic Cough. Cardiovascular:Present- Chest Pain and Palpitations. Not Present- Racing/skipping heartbeats, Difficulty   Breathing Lying  Down, Murmur and Swelling. Gastrointestinal:Present- Heartburn and Constipation. Not Present- Bloody Stool, Abdominal Pain, Vomiting, Nausea, Diarrhea, Difficulty Swallowing, Jaundice and Loss of appetitie. Female Genitourinary:Not Present- Blood in Urine, Urinary frequency, Weak urinary stream, Discharge, Flank Pain, Incontinence, Painful Urination, Urgency, Urinary Retention and Urinating at Night. Musculoskeletal:Present- Muscle Weakness, Muscle Pain, Back Pain and Morning Stiffness. Not Present- Joint Swelling, Joint Pain and Spasms. Neurological:Not Present- Tremor, Dizziness, Blackout spells, Paralysis, Difficulty with balance and Weakness. Psychiatric:Present- Memory Loss. Not Present- Insomnia.   Vitals Height: 64 in Height was reported by patient. Pulse: 76 (Regular) Resp.: 14 (Unlabored) BP: 126/72 (Sitting, Right Arm, Standard)    Physical Exam The physical exam findings are as follows:  Note: Patinet is a 66 year old female with continued severe left hip pain. Patient is accompanied today by her husband Steve and her sister Evelyn.   General Mental Status - Alert and cooperative. General Appearance- pleasant and Anxious. Not in acute distress. Orientation- Oriented X3. Build & Nutrition- Well nourished and Well developed.   Head and Neck Head- normocephalic, atraumatic . Neck Global Assessment- supple. no bruit auscultated on the right and no bruit auscultated on the left.   Eye Pupil- Bilateral- Regular and Round. Motion- Bilateral- EOMI.   Chest and Lung Exam Auscultation: Breath sounds:- clear at anterior chest wall and - clear at posterior chest wall. Adventitious sounds:- No Adventitious sounds.   Cardiovascular Auscultation:Rhythm- Regular rate and rhythm. Heart Sounds- S1 WNL and S2 WNL. Murmurs & Other Heart Sounds:Auscultation of the heart reveals - No Murmurs.   Abdomen Palpation/Percussion:Tenderness-  Abdomen is non-tender to palpation. Rigidity (guarding)- Abdomen is soft. Auscultation:Auscultation of the abdomen reveals - Bowel sounds normal.   Female Genitourinary  Not done, not pertinent to present illness  Musculoskeletal On exam well developed female, alert and oriented in no apparent distress. Her right hip shows normal range of motion and no discomfort. The left hip flexion to 100, bout 10 internal rotation, 10-15 external rotation, about 20-30 abduction. She walks with a significantly antalgic gait pattern.  RADIOGRAPHS: Her radiographs, AP and lateral of the left hip from Dr. Nitka's office in October shows severe bone on bone arthritis. She has varus femoral neck. The arthritis is rather severe with some bony erosions and spurs.  Assessment & Plan Osteoarthritis, hip (715.35) Impression: Left Hip  Note: Plan is for a Left Total Hip Replacement by Dr. Aluisio.  Plan is to go home.  PCP - D. Hopper - Patient has been seen preoperatively and felt to be stable for surgery.  Signed electronically by DREW L PERKINS, PA-C 

## 2012-04-13 LAB — CBC
HCT: 32.4 % — ABNORMAL LOW (ref 36.0–46.0)
RBC: 3.6 MIL/uL — ABNORMAL LOW (ref 3.87–5.11)
RDW: 13.1 % (ref 11.5–15.5)
WBC: 10.6 10*3/uL — ABNORMAL HIGH (ref 4.0–10.5)

## 2012-04-13 LAB — BASIC METABOLIC PANEL
BUN: 13 mg/dL (ref 6–23)
CO2: 26 mEq/L (ref 19–32)
Chloride: 105 mEq/L (ref 96–112)
GFR calc Af Amer: >90 mL/min (ref >90)
Potassium: 3.7 mEq/L (ref 3.5–5.1)

## 2012-04-13 MED ORDER — RIVAROXABAN 10 MG PO TABS: 10.0000 mg | ORAL_TABLET | Freq: Every day | ORAL | Status: DC

## 2012-04-13 MED ORDER — METHOCARBAMOL 500 MG PO TABS: 500.0000 mg | ORAL_TABLET | Freq: Four times a day (QID) | ORAL | Status: DC | PRN

## 2012-04-13 MED ORDER — DIAZEPAM 5 MG PO TABS
5.0000 mg | ORAL_TABLET | Freq: Four times a day (QID) | ORAL | Status: DC | PRN
  Administered 2012-04-15 – 2012-04-17 (×8): 5 mg via ORAL
  Filled 2012-04-13 (×8): qty 1

## 2012-04-13 MED ORDER — TRAMADOL HCL 50 MG PO TABS: 50.0000 mg | ORAL_TABLET | Freq: Four times a day (QID) | ORAL | Status: DC | PRN

## 2012-04-13 MED ORDER — OXYCODONE HCL 5 MG PO TABS: 5.0000 mg | ORAL_TABLET | ORAL | Status: DC | PRN

## 2012-04-13 MED ORDER — DIAZEPAM 5 MG PO TABS: 5.0000 mg | ORAL_TABLET | Freq: Four times a day (QID) | ORAL | Status: DC | PRN

## 2012-04-13 NOTE — Clinical Social Work Note (Signed)
CSW consult for SNF. PT recommendation for HHPT noted. CSW signing off as no other CSW needs identified.  Dellie Burns, MSW, LCSWA [phone] (Weekends 8:00am-4:30pm)

## 2012-04-13 NOTE — Progress Notes (Signed)
CARE MANAGEMENT NOTE 04/13/2012  Patient:  GUSTAVO, MEDITZ   Account Number:  [id number]  Date Initiated:  04/13/2012  Documentation initiated by:  Vance Peper  Subjective/Objective Assessment:   67 yr old female s/p left total hip arthroplasty.     Action/Plan:   Cm spoke with patient and husband concerning home health and DME needs. Choice offered. Called referral to [facility], [facility] liasion.Husband states they are borowing rolling walker and 3in1.   Anticipated DC Date:  04/15/2012   Anticipated DC Plan:  HOME W HOME HEALTH SERVICES      DC Planning Services  CM consult      [facility] Choice  HOME HEALTH   Choice offered to / List presented to:  C-1 Patient        HH arranged  HH-2 PT      [facility] agency  [facility]   Status of service:  Completed, signed off Medicare Important Message given?   (If response is "NO", the following Medicare IM given date fields will be blank) Date Medicare IM given:   Date Additional Medicare IM given:    Discharge Disposition:  HOME W HOME HEALTH SERVICES  Per UR Regulation:    If discussed at Long Length of Stay Meetings, dates discussed:    Comments:

## 2012-04-13 NOTE — Progress Notes (Signed)
   Subjective: 1 Day Post-Op Procedure(s) (LRB): TOTAL HIP ARTHROPLASTY ANTERIOR APPROACH (Left) Patient reports pain as 4 on 0-10 scale.   We will start therapy today.  Plan is to go home or SNF after hospital stay depending on progress with PT  Objective: Vital signs in last 24 hours: Temp:  [96.9 F (36.1 C)-98.8 F (37.1 C)] 98.7 F (37.1 C) (03/08 0631) Pulse Rate:  [71-93] 80 (03/08 0631) Resp:  [11-24] 18 (03/08 0631) BP: (102-150)/(54-83) 108/67 mmHg (03/08 0631) SpO2:  [80 %-99 %] 97 % (03/08 0631)  Intake/Output from previous day:  Intake/Output Summary (Last 24 hours) at 04/13/12 0937 Last data filed at 04/13/12 0800  Gross per 24 hour  Intake 401.25 ml  Output   2475 ml  Net -2073.75 ml    Intake/Output this shift: Total I/O In: -  Out: 150 [Drains:150]  Labs:  Recent Labs  04/13/12 0525  HGB 11.2*    Recent Labs  04/13/12 0525  WBC 10.6*  RBC 3.60*  HCT 32.4*  PLT 264    Recent Labs  04/13/12 0525  NA 140  K 3.7  CL 105  CO2 26  BUN 13  CREATININE 0.72  GLUCOSE 142*  CALCIUM 9.0   No results found for this basename: LABPT, INR,  in the last 72 hours  EXAM General - Patient is Alert, Appropriate and Oriented Extremity - Neurologically intact Neurovascular intact No cellulitis present Compartment soft Dressing - dressing C/D/I Motor Function - intact, moving foot and toes well on exam.  Hemovac pulled without difficulty.  Past Medical History  Diagnosis Date  . Hiatal hernia   . Esophageal reflux   . Personal history of other disorders of nervous system and sense organs   . Other and unspecified hyperlipidemia   . Abdominal pain, epigastric   . Flatulence, eructation, and gas pain   . Irritable bowel syndrome   . Pain in joint, site unspecified   . Other abnormal glucose   . Abnormality of gait   . Cervicalgia   . Lumbago   . Nonspecific elevation of levels of transaminase or lactic acid dehydrogenase (LDH)   . Other  malaise and fatigue   . Anxiety state, unspecified   . Palpitations   . Other chest pain   . Restless leg syndrome   . Other and unspecified hyperlipidemia   . Parkinson disease   . Parkinson's disease     Dr Zola Button, [facility]  . Detrusor instability   . Bladder cancer 2011    DR. GRAPEY  . PONV (postoperative nausea and vomiting)   . History of recurrent UTIs   . Arthritis     Assessment/Plan: 1 Day Post-Op Procedure(s) (LRB): TOTAL HIP ARTHROPLASTY ANTERIOR APPROACH (Left) Principal Problem:   OA (osteoarthritis) of hip   Advance diet Up with therapy D/C IV fluids D/C home vs SNF depending on progress with PT  DVT Prophylaxis - Xarelto Weight Bearing As Tolerated left Leg Hemovac Pulled Begin Therapy   Loanne Drilling

## 2012-04-13 NOTE — Progress Notes (Signed)
PT Cancellation Note  Patient Details Name: Kristina Carr MRN: [id number] DOB: [birth year]   Cancelled Treatment:    Reason Eval/Treat Not Completed: Medical issues which prohibited therapy;Fatigue/lethargy limiting ability to participate.  Patient anxious, having difficulty relaxing LE's (lying with LE's flexed in hook-lying position).  Patient complaining of increased pain.  Unable to participate with PT this pm.  Will return in am.   Vena Austria 04/13/2012, 5:06 PM

## 2012-04-13 NOTE — Progress Notes (Signed)
Physical Therapy Note 04/13/12  10:55  04/13/12 1053  PT Visit Information  Last PT Received On 04/13/12  Assistance Needed +1  PT Time Calculation  PT Start Time 1007  PT Stop Time 1050  PT Time Calculation (min) 43 min  Subjective Data  Subjective "Does everyone have pain like this?"  Patient c/o increased pain in Lt. LE with weightbearing.  Encouraged use of UE's on RW to offload LLE.  Precautions  Precautions Fall  Restrictions  Weight Bearing Restrictions Yes  LLE Weight Bearing WBAT  Cognition  Overall Cognitive Status Impaired  Area of Impairment Memory;Attention  Arousal/Alertness Awake/alert  Orientation Level Appears intact for tasks assessed  Behavior During Session Anxious  Current Attention Level Selective  Attention - Other Comments Easily distracted.  Goes off topic easily.  Memory Deficits Decreased recall of information from prior session.  Bed Mobility  Bed Mobility Supine to Sit;Sitting - Scoot to Edge of Bed  Supine to Sit 4: Min assist;HOB elevated  Sitting - Scoot to [place] of Bed 4: Min guard  Details for Bed Mobility Assistance Verbal cues for technique and assist to move LLE.  Transfers  Transfers Sit to Stand;Stand to Sit  Sit to Stand 4: Min assist;With upper extremity assist;From bed  Stand to Sit 4: Min assist;With upper extremity assist;With armrests;To chair/3-in-1  Details for Transfer Assistance Verbal cues for hand placement and placement of LLE during transfers.    Ambulation/Gait  Ambulation/Gait Assistance 4: Min assist  Ambulation Distance (Feet) 42 Feet  Assistive device Rolling walker  Ambulation/Gait Assistance Details Verbal cues for safe use of RW and gait sequence.  Needed repeated cues for gait sequence throughout session.  Verbal cues to look forward and take longer steps.  Patient c/o increased pain in LLE during stance.  Encouraged use of UE's to offload LLE.  Gait Pattern Step-to pattern;Decreased stance time - left;Decreased step  length - right;Antalgic;Trunk flexed  Gait velocity Very slow gait speed  General Gait Details Parkinson's impacting gait - shuffle steps and difficulty initiating step with RLE.  Exercises  Exercises Total Joint  Total Joint Exercises  Ankle Circles/Pumps AROM;Both;10 reps;Seated  Quad Sets AROM;Left;10 reps;Seated  Heel Slides AAROM;Left;10 reps;Supine  Hip ABduction/ADduction AAROM;Left;10 reps;Seated  PT - End of Session  Equipment Utilized During Treatment Gait belt  Activity Tolerance Patient limited by pain;Patient limited by fatigue  Patient left in chair;with call bell/phone within reach;with family/visitor present  Nurse Communication Mobility status  PT - Assessment/Plan  Comments on Treatment Session Patient making slow progress with mobility.  Anxious during session.  PT Plan Discharge plan remains appropriate;Frequency remains appropriate  PT Frequency 7X/week  Follow Up Recommendations Home health PT;Supervision/Assistance - 24 hour  PT equipment (3-in-1 BSC)  Acute Rehab PT Goals  PT Goal: Supine/Side to Sit - Progress Progressing toward goal  PT Goal: Sit to Stand - Progress Progressing toward goal  PT Goal: Ambulate - Progress Progressing toward goal  PT Goal: Perform Home Exercise Program - Progress Progressing toward goal  PT General Charges  $$ ACUTE PT VISIT 1 Procedure  PT Treatments  $Gait Training 23-37 mins  $Therapeutic Exercise 8-22 mins  Durenda Hurt. Renaldo Fiddler, [facility] Acute Rehab Services Pager [phone]

## 2012-04-13 NOTE — Progress Notes (Signed)
  OT Cancellation Note  Patient Details Name: Kristina Carr MRN: [id number] DOB: [birth year]   Cancelled Treatment:    Reason Eval/Treat Not Completed: Pain limiting ability to participate (reports severe pain) Pt demonstrates cognition deficits during session. Pt talking about topics not relevant to discussion and requesting friend present to answer the question. Pt talking to friend on arrival. Pt's friend went to opposite side of the bed and OT asked patient is it okay if I ask you questions with this friend present due to hippa. Pt said "who is it?" pt with poor recall of who was present in the room. Pt agreeable to therapy in the AM.    Lucile Shutters Pager: [phone]  04/13/2012, 4:41 PM

## 2012-04-14 LAB — BASIC METABOLIC PANEL
Calcium: 8.8 mg/dL (ref 8.4–10.5)
GFR calc Af Amer: >90 mL/min (ref >90)
GFR calc non Af Amer: >90 mL/min (ref >90)
Glucose, Bld: 139 mg/dL — ABNORMAL HIGH (ref 70–99)
Potassium: 3.5 mEq/L (ref 3.5–5.1)
Sodium: 140 mEq/L (ref 135–145)

## 2012-04-14 LAB — CBC
Hemoglobin: 10.6 g/dL — ABNORMAL LOW (ref 12.0–15.0)
MCH: 30.8 pg (ref 26.0–34.0)
MCHC: 34.1 g/dL (ref 30.0–36.0)
Platelets: 236 10*3/uL (ref 150–400)
RDW: 13.3 % (ref 11.5–15.5)

## 2012-04-14 NOTE — Progress Notes (Addendum)
Physical Therapy Treatment Patient Details Name: Kristina Carr MRN: [id number] DOB: 1945-04-16 Today's Date: 04/14/2012 Time: 6578-4696 PT Time Calculation (min): 28 min  PT Assessment / Plan / Recommendation Comments on Treatment Session  Patient making slow progress with mobility.  Anxious during session.    Follow Up Recommendations  Home health PT;Supervision/Assistance - 24 hour           Equipment Recommendations  None recommended by PT    Recommendations for Other Services Rehab consult  Frequency 7X/week   Plan Discharge plan needs to be updated;Frequency remains appropriate    Precautions / Restrictions Precautions Precautions: Fall Restrictions LLE Weight Bearing: Weight bearing as tolerated       Mobility  Bed Mobility Bed Mobility: Not assessed Transfers Sit to Stand: 4: Min assist;With upper extremity assist;With armrests;From chair/3-in-1 Stand to Sit: 4: Min guard;With armrests;With upper extremity assist;To chair/3-in-1 Details for Transfer Assistance: cues for hand and LLE placment with transfers Ambulation/Gait Ambulation/Gait Assistance: 4: Min assist Ambulation Distance (Feet): 45 Feet Assistive device: Rolling walker Ambulation/Gait Assistance Details: Needed repeated cues for gait sequence throughout session. cues for posture (look up) and for walker position with gait.  Gait Pattern: Step-to pattern;Decreased stance time - left;Decreased step length - right;Antalgic;Trunk flexed Gait velocity: Very slow gait speed General Gait Details: Parkinson's impacting gait - shuffle steps and difficulty initiating step with RLE.    Exercises Total Joint Exercises Ankle Circles/Pumps: AROM;Both;10 reps;Seated Quad Sets: AROM;Strengthening;Both;10 reps;Seated Long Arc Quad: AROM;Strengthening;Both;10 reps;Seated     PT Goals Acute Rehab PT Goals PT Goal: Sit to Stand - Progress: Progressing toward goal PT Goal: Ambulate - Progress: Progressing  toward goal PT Goal: Perform Home Exercise Program - Progress: Progressing toward goal  Visit Information  Last PT Received On: 04/14/12 Assistance Needed: +1    Subjective Data  Subjective: Complaints of pain in left hip, 6/10 currently. Has been premedicated.   Cognition  Cognition Overall Cognitive Status: Impaired Area of Impairment: Memory;Awareness of errors;Attention;Safety/judgement Arousal/Alertness: Awake/alert Orientation Level: Appears intact for tasks assessed Behavior During Session: Anxious Current Attention Level: Selective Attention - Other Comments: Easily distracted.  Goes off topic easily. Memory Deficits: Decreased recall of information from prior session. Cognition - Other Comments: Spouse reports increased confusion since surgery, that it was not "this bad" before with her Parkinson's.        End of Session PT - End of Session Equipment Utilized During Treatment: Gait belt Activity Tolerance: Patient limited by pain;Patient tolerated treatment well Patient left: in chair;with family/visitor present;with call bell/phone within reach Nurse Communication: Mobility status   GP     Sallyanne Kuster 04/14/2012, 3:10 PM  Sallyanne Kuster, PTA Office- [phone]

## 2012-04-14 NOTE — Progress Notes (Addendum)
Subjective: 2 Days Post-Op Procedure(s) (LRB): TOTAL HIP ARTHROPLASTY ANTERIOR APPROACH (Left) Patient reports pain as moderate. C/o incisional pain. Still noting some confusion, mostly at night, has improved some per her sister who is here with her today. Does not feel ready for D/C home today.  Objective: Vital signs in last 24 hours: Temp:  [98.7 F (37.1 C)-99 F (37.2 C)] 98.7 F (37.1 C) (03/09 0604) Pulse Rate:  [67-90] 67 (03/09 0604) Resp:  [18] 18 (03/09 0604) BP: (108-138)/(42-64) 112/62 mmHg (03/09 0604) SpO2:  [95 %-97 %] 95 % (03/09 0604)  Intake/Output from previous day: 03/08 0701 - 03/09 0700 In: 2493.8 [P.O.:360; I.V.:2133.8] Out: 200 [Drains:200] Intake/Output this shift:     Recent Labs  04/13/12 0525 04/14/12 0630  HGB 11.2* 10.6*    Recent Labs  04/13/12 0525 04/14/12 0630  WBC 10.6* 9.6  RBC 3.60* 3.44*  HCT 32.4* 31.1*  PLT 264 236    Recent Labs  04/13/12 0525 04/14/12 0630  NA 140 140  K 3.7 3.5  CL 105 103  CO2 26 24  BUN 13 12  CREATININE 0.72 0.62  GLUCOSE 142* 139*  CALCIUM 9.0 8.8   No results found for this basename: LABPT, INR,  in the last 72 hours  Neurologically intact Neurovascular intact Sensation intact distally Intact pulses distally Dorsiflexion/Plantar flexion intact Incision: dressing C/D/I and no drainage No cellulitis present Compartment soft No calf pain or sign of DVT  Assessment/Plan: 2 Days Post-Op Procedure(s) (LRB): TOTAL HIP ARTHROPLASTY ANTERIOR APPROACH (Left) Advance diet Up with therapy D/C IV fluids Plan D/C tomorrow home with home health vs SNF Dressing change today  BISSELL, JACLYN M. 04/14/2012, 9:50 AM

## 2012-04-14 NOTE — Care Management Note (Addendum)
04/14/12 1514 Melvinia Ashby,RN,BSN [phone] Cm spoke with patient with spouse and adult son at bedside. Pt and family provided with [facility] agency list and private duty caare list. PT eval suggest HHPT with 24/care. Per pt choice [facility] to provide services.  Spouse states will be home with pt during the day but is concerned about care during the night related to pt recent post op nocturnal confusion. Pt's family states has access to [facility] if pt decides to go home with [facility] services.

## 2012-04-15 ENCOUNTER — Encounter (HOSPITAL_COMMUNITY): Payer: Self-pay | Admitting: Orthopedic Surgery

## 2012-04-15 LAB — CBC
MCH: 31.2 pg (ref 26.0–34.0)
Platelets: 245 10*3/uL (ref 150–400)
RBC: 3.3 MIL/uL — ABNORMAL LOW (ref 3.87–5.11)

## 2012-04-15 NOTE — Progress Notes (Signed)
Agree with SPTA.    Megan Ritenour, PT [phone]  

## 2012-04-15 NOTE — Plan of Care (Signed)
Problem: Phase II Progression Outcomes Goal: Discharge plan established Recommend SNF fo further therapy needs after acute care d/c. Pt sister prefers [facility], but agreeable to SNF if pt unable to go to [facility].

## 2012-04-15 NOTE — Progress Notes (Signed)
Physical Therapy Treatment Patient Details Name: Kristina Carr MRN: [id number] DOB: [birth year] Today's Date: 04/15/2012 Time: 3086-5784 PT Time Calculation (min): 26 min  PT Assessment / Plan / Recommendation Comments on Treatment Session  Pt's pain limiting mobility overall.  Pt is anxious about returning home.  Spoke to pt and husband regarding pt not being safe to return home, would benefit from increased rehab prior to returning home.      Follow Up Recommendations  [facility]     Does the patient have the potential to tolerate intense rehabilitation     Barriers to Discharge        Equipment Recommendations  None recommended by PT    Recommendations for Other Services Rehab consult  Frequency 7X/week   Plan Discharge plan needs to be updated;Frequency remains appropriate    Precautions / Restrictions Precautions Precautions: None Precaution Comments: Direct Anterior Approach - no precautions Restrictions LLE Weight Bearing: Weight bearing as tolerated   Pertinent Vitals/Pain Pt with increased pain with movement, anxious.    Mobility  Bed Mobility Bed Mobility: Supine to Sit;Sitting - Scoot to Edge of Bed Supine to Sit: 4: Min assist;With rails;HOB elevated Sitting - Scoot to [place] of Bed: 4: Min assist;With rail Details for Bed Mobility Assistance: (A) with trunk control and elevation.  Cues for technique and sequencings. Transfers Transfers: Sit to Stand;Stand to Sit Sit to Stand: 4: Min assist;With upper extremity assist;From elevated surface;From bed;From chair/3-in-1 Stand to Sit: 4: Min assist;With upper extremity assist;With armrests;To chair/3-in-1 Details for Transfer Assistance: (A) for stability and safety.  Cues for hand placement and technique.   Ambulation/Gait Ambulation/Gait Assistance: 4: Min assist Ambulation Distance (Feet): 20 Feet Assistive device: Rolling walker Ambulation/Gait Assistance Details: Cues for upright posture, looking forward, and  relaxing shoulders.   Increased challenge with ambulation due to Parkinson's.  Cueing for increased step length. Gait Pattern: Step-to pattern;Shuffle;Trunk flexed Gait velocity: Decreased Stairs: No Wheelchair Mobility Wheelchair Mobility: No    Exercises     PT Diagnosis:    PT Problem List:   PT Treatment Interventions:     PT Goals Acute Rehab PT Goals PT Goal: Supine/Side to Sit - Progress: Progressing toward goal PT Goal: Sit to Stand - Progress: Progressing toward goal PT Goal: Ambulate - Progress: Progressing toward goal  Visit Information  Last PT Received On: 04/15/12 Assistance Needed: +1    Subjective Data      Cognition  Cognition Overall Cognitive Status: Appears within functional limits for tasks assessed/performed Arousal/Alertness: Awake/alert Orientation Level: Appears intact for tasks assessed Behavior During Session: [facility] for tasks performed Cognition - Other Comments: Pt appears anxious due to anticipatory pain.    Balance     End of Session PT - End of Session Equipment Utilized During Treatment: Gait belt Activity Tolerance: Patient limited by pain;Patient limited by fatigue Patient left: in chair;with call bell/phone within reach;with family/visitor present   GP     Kristina Carr SPTA 04/15/2012, 10:14 AM   Seen and agreed Kristina Carr, PTA [phone]

## 2012-04-15 NOTE — Discharge Summary (Signed)
Physician Discharge Summary   Patient ID: Kristina Carr MRN: [id number] DOB/AGE: 1945-06-27 67 y.o.  Admit date: 04/12/2012 Discharge date: Tentative Date of Discharge - 04/15/2012  Primary Diagnosis:  Osteoarthritis of the Left hip  Admission Diagnoses:  Past Medical History  Diagnosis Date  . Hiatal hernia   . Esophageal reflux   . Personal history of other disorders of nervous system and sense organs   . Other and unspecified hyperlipidemia   . Abdominal pain, epigastric   . Flatulence, eructation, and gas pain   . Irritable bowel syndrome   . Pain in joint, site unspecified   . Other abnormal glucose   . Abnormality of gait   . Cervicalgia   . Lumbago   . Nonspecific elevation of levels of transaminase or lactic acid dehydrogenase (LDH)   . Other malaise and fatigue   . Anxiety state, unspecified   . Palpitations   . Other chest pain   . Restless leg syndrome   . Other and unspecified hyperlipidemia   . Parkinson disease   . Parkinson's disease     Dr Zola Button, [facility]  . Detrusor instability   . Bladder cancer 2011    DR. GRAPEY  . PONV (postoperative nausea and vomiting)   . History of recurrent UTIs   . Arthritis    Discharge Diagnoses:   Principal Problem:   OA (osteoarthritis) of hip  Estimated body mass index is 30.23 kg/(m^2) as calculated from the following:   Height as of 04/10/12: 5\' 4"  (1.626 m).   Weight as of 09/22/11: 79.924 kg (176 lb 3.2 oz).  Procedure(s) (LRB): TOTAL HIP ARTHROPLASTY ANTERIOR APPROACH (Left)   Consults: None  HPI: Kristina Carr is a 67 y.o. female who has advanced end-  stage arthritis of his Left hip with progressively worsening pain and  dysfunction.The patient has failed nonoperative management and presents for  total hip arthroplasty.   Laboratory Data: Admission on 04/12/2012  Component Date Value Range Status  . WBC 04/13/2012 10.6* 4.0 - 10.5 K/uL Final  . RBC 04/13/2012 3.60* 3.87 - 5.11 MIL/uL Final  .  Hemoglobin 04/13/2012 11.2* 12.0 - 15.0 g/dL Final  . HCT 40/98/1191 32.4* 36.0 - 46.0 % Final  . MCV 04/13/2012 90.0  78.0 - 100.0 fL Final  . MCH 04/13/2012 31.1  26.0 - 34.0 pg Final  . MCHC 04/13/2012 34.6  30.0 - 36.0 g/dL Final  . RDW 47/82/9562 13.1  11.5 - 15.5 % Final  . Platelets 04/13/2012 264  150 - 400 K/uL Final  . Sodium 04/13/2012 140  135 - 145 mEq/L Final  . Potassium 04/13/2012 3.7  3.5 - 5.1 mEq/L Final  . Chloride 04/13/2012 105  96 - 112 mEq/L Final  . CO2 04/13/2012 26  19 - 32 mEq/L Final  . Glucose, Bld 04/13/2012 142* 70 - 99 mg/dL Final  . BUN 13/09/6576 13  6 - 23 mg/dL Final  . Creatinine, Ser 04/13/2012 0.72  0.50 - 1.10 mg/dL Final  . Calcium 46/96/2952 9.0  8.4 - 10.5 mg/dL Final  . GFR calc non Af Amer 04/13/2012 89* >90 mL/min Final  . GFR calc Af Amer 04/13/2012 >90  >90 mL/min Final   Comment:                                 The eGFR has been calculated  using the CKD EPI equation.                          This calculation has not been                          validated in all clinical                          situations.                          eGFR's persistently                          <90 mL/min signify                          possible Chronic Kidney Disease.  . WBC 04/14/2012 9.6  4.0 - 10.5 K/uL Final  . RBC 04/14/2012 3.44* 3.87 - 5.11 MIL/uL Final  . Hemoglobin 04/14/2012 10.6* 12.0 - 15.0 g/dL Final  . HCT 62/13/0865 31.1* 36.0 - 46.0 % Final  . MCV 04/14/2012 90.4  78.0 - 100.0 fL Final  . MCH 04/14/2012 30.8  26.0 - 34.0 pg Final  . MCHC 04/14/2012 34.1  30.0 - 36.0 g/dL Final  . RDW 78/46/9629 13.3  11.5 - 15.5 % Final  . Platelets 04/14/2012 236  150 - 400 K/uL Final  . Sodium 04/14/2012 140  135 - 145 mEq/L Final  . Potassium 04/14/2012 3.5  3.5 - 5.1 mEq/L Final  . Chloride 04/14/2012 103  96 - 112 mEq/L Final  . CO2 04/14/2012 24  19 - 32 mEq/L Final  . Glucose, Bld 04/14/2012 139* 70 - 99 mg/dL  Final  . BUN 52/84/1324 12  6 - 23 mg/dL Final  . Creatinine, Ser 04/14/2012 0.62  0.50 - 1.10 mg/dL Final  . Calcium 40/11/2723 8.8  8.4 - 10.5 mg/dL Final  . GFR calc non Af Amer 04/14/2012 >90  >90 mL/min Final  . GFR calc Af Amer 04/14/2012 >90  >90 mL/min Final   Comment:                                 The eGFR has been calculated                          using the CKD EPI equation.                          This calculation has not been                          validated in all clinical                          situations.                          eGFR's persistently                          <90 mL/min signify  possible Chronic Kidney Disease.  Hospital Outpatient Visit on 04/10/2012  Component Date Value Range Status  . aPTT 04/10/2012 29  24 - 37 seconds Final  . WBC 04/10/2012 6.6  4.0 - 10.5 K/uL Final  . RBC 04/10/2012 4.79  3.87 - 5.11 MIL/uL Final  . Hemoglobin 04/10/2012 14.3  12.0 - 15.0 g/dL Final  . HCT 16/11/9602 42.1  36.0 - 46.0 % Final  . MCV 04/10/2012 87.9  78.0 - 100.0 fL Final  . MCH 04/10/2012 29.9  26.0 - 34.0 pg Final  . MCHC 04/10/2012 34.0  30.0 - 36.0 g/dL Final  . RDW 54/10/8117 13.0  11.5 - 15.5 % Final  . Platelets 04/10/2012 278  150 - 400 K/uL Final  . Sodium 04/10/2012 138  135 - 145 mEq/L Final  . Potassium 04/10/2012 4.1  3.5 - 5.1 mEq/L Final  . Chloride 04/10/2012 99  96 - 112 mEq/L Final  . CO2 04/10/2012 27  19 - 32 mEq/L Final  . Glucose, Bld 04/10/2012 98  70 - 99 mg/dL Final  . BUN 14/78/2956 16  6 - 23 mg/dL Final  . Creatinine, Ser 04/10/2012 0.80  0.50 - 1.10 mg/dL Final  . Calcium 21/30/8657 10.2  8.4 - 10.5 mg/dL Final  . Total Protein 04/10/2012 7.5  6.0 - 8.3 g/dL Final  . Albumin 84/69/6295 4.3  3.5 - 5.2 g/dL Final  . AST 28/41/3244 22  0 - 37 U/L Final  . ALT 04/10/2012 6  0 - 35 U/L Final  . Alkaline Phosphatase 04/10/2012 68  39 - 117 U/L Final  . Total Bilirubin 04/10/2012 0.3  0.3 - 1.2 mg/dL  Final  . GFR calc non Af Amer 04/10/2012 76* >90 mL/min Final  . GFR calc Af Amer 04/10/2012 88* >90 mL/min Final   Comment:                                 The eGFR has been calculated                          using the CKD EPI equation.                          This calculation has not been                          validated in all clinical                          situations.                          eGFR's persistently                          <90 mL/min signify                          possible Chronic Kidney Disease.  Marland Kitchen Prothrombin Time 04/10/2012 12.7  11.6 - 15.2 seconds Final  . INR 04/10/2012 0.96  0.00 - 1.49 Final  . ABO/RH(D) 04/10/2012 O POS   Final  . Antibody Screen 04/10/2012 NEG   Final  . Sample Expiration 04/10/2012 04/13/2012   Final  . Color,  Urine 04/10/2012 YELLOW  YELLOW Final  . APPearance 04/10/2012 CLEAR  CLEAR Final  . Specific Gravity, Urine 04/10/2012 1.031* 1.005 - 1.030 Final  . pH 04/10/2012 5.0  5.0 - 8.0 Final  . Glucose, UA 04/10/2012 NEGATIVE  NEGATIVE mg/dL Final  . Hgb urine dipstick 04/10/2012 NEGATIVE  NEGATIVE Final  . Bilirubin Urine 04/10/2012 NEGATIVE  NEGATIVE Final  . Ketones, ur 04/10/2012 15* NEGATIVE mg/dL Final  . Protein, ur 96/05/5407 NEGATIVE  NEGATIVE mg/dL Final  . Urobilinogen, UA 04/10/2012 0.2  0.0 - 1.0 mg/dL Final  . Nitrite 81/19/1478 NEGATIVE  NEGATIVE Final  . Leukocytes, UA 04/10/2012 NEGATIVE  NEGATIVE Final   MICROSCOPIC NOT DONE ON URINES WITH NEGATIVE PROTEIN, BLOOD, LEUKOCYTES, NITRITE, OR GLUCOSE <1000 mg/dL.  Marland Kitchen MRSA, PCR 04/10/2012 NEGATIVE  NEGATIVE Final  . Staphylococcus aureus 04/10/2012 NEGATIVE  NEGATIVE Final   Comment:                                 The Xpert SA Assay (FDA                          approved for NASAL specimens                          in patients over 70 years of age),                          is one component of                          a comprehensive surveillance                           program.  Test performance has                          been validated by [employer] for patients greater                          than or equal to 103 year old.                          It is not intended                          to diagnose infection nor to                          guide or monitor treatment.  . ABO/RH(D) 04/10/2012 O POS   Final  Office Visit on 02/26/2012  Component Date Value Range Status  . Total Bilirubin 02/26/2012 0.4  0.3 - 1.2 mg/dL Final  . Bilirubin, Direct 02/26/2012 0.0  0.0 - 0.3 mg/dL Final  . Alkaline Phosphatase 02/26/2012 57  39 - 117 U/L Final  . AST 02/26/2012 15  0 - 37 U/L Final  . ALT 02/26/2012 5  0 - 35 U/L Final  . Total Protein 02/26/2012 7.3  6.0 -  8.3 g/dL Final  . Albumin 40/98/1191 4.3  3.5 - 5.2 g/dL Final  . Sodium 47/82/9562 136  135 - 145 mEq/L Final  . Potassium 02/26/2012 4.0  3.5 - 5.1 mEq/L Final  . Chloride 02/26/2012 102  96 - 112 mEq/L Final  . CO2 02/26/2012 26  19 - 32 mEq/L Final  . Glucose, Bld 02/26/2012 106* 70 - 99 mg/dL Final  . BUN 13/09/6576 24* 6 - 23 mg/dL Final  . Creatinine, Ser 02/26/2012 0.8  0.4 - 1.2 mg/dL Final  . Calcium 46/96/2952 9.2  8.4 - 10.5 mg/dL Final  . GFR 84/13/2440 76.67  >60.00 mL/min Final  . Cholesterol 02/26/2012 209* 0 - 200 mg/dL Final   ATP III Classification       Desirable:  < 200 mg/dL               Borderline High:  200 - 239 mg/dL          High:  > = 102 mg/dL  . Triglycerides 02/26/2012 132.0  0.0 - 149.0 mg/dL Final   Normal:  <725 mg/dLBorderline High:  150 - 199 mg/dL  . HDL 02/26/2012 43.60  >39.00 mg/dL Final  . VLDL 36/64/4034 26.4  0.0 - 40.0 mg/dL Final  . Total CHOL/HDL Ratio 02/26/2012 5   Final                  Men          Women1/2 Average Risk     3.4          3.3Average Risk          5.0          4.42X Average Risk          9.6          7.13X Average Risk          15.0          11.0                      . TSH 02/26/2012 0.71  0.35 -  5.50 uIU/mL Final  . Hemoglobin A1C 02/26/2012 6.0  4.6 - 6.5 % Final   Glycemic Control Guidelines for People with Diabetes:Non Diabetic:  <6%Goal of Therapy: <7%Additional Action Suggested:  >8%   . Direct LDL 02/26/2012 135.1   Final   Optimal:  <100 mg/dLNear or Above Optimal:  100-129 mg/dLBorderline High:  130-159 mg/dLHigh:  160-189 mg/dLVery High:  >190 mg/dL     X-Rays:Dg Chest 2 View  04/10/2012  *RADIOLOGY REPORT*  Clinical Data: Pre admission film.  CHEST - 2 VIEW  Comparison: Chest x-ray 07/26/2011.  Findings: Lung volumes are low.  Small dense nodules in the right mid lung are compatible with calcified granulomas and are unchanged.  No other larger or suspicious appearing pulmonary nodules or masses are identified.  No acute consolidative airspace disease.  No pleural effusions.  Pulmonary vasculature and the cardiomediastinal silhouette are within normal limits. Atherosclerosis in the thoracic aorta.  IMPRESSION: 1.  Low lung volumes without radiographic evidence of acute cardiopulmonary disease. 2.  Atherosclerosis. 3.  Old granulomatous disease, as above.   Original Report Authenticated By: Trudie Reed, M.D.    Dg Hip Complete Left  04/10/2012  *RADIOLOGY REPORT*  Clinical Data: Preoperative evaluation for left total hip replacement.  LEFT HIP - COMPLETE 2+ VIEW  Comparison: No priors.  Findings: AP view of the pelvis and AP and lateral  views of the left hip demonstrate severe joint space narrowing, subchondral sclerosis, subchondral cyst formation and osteophyte formation in the left hip joint, compatible with advanced osteoarthritis. Similar findings are present to a much lesser degree in the contralateral hip.  No acute displaced fracture.  The hip is located.  IMPRESSION: 1.  Advanced osteoarthritis of the left hip joint, as above.   Original Report Authenticated By: Trudie Reed, M.D.    Dg Hip Operative Left  04/12/2012  *RADIOLOGY REPORT*  Clinical Data: Left total hip  arthroplasty.  OPERATIVE LEFT HIP  Comparison: 04/10/2012  Findings: The femoral and acetabular components are well seated. No complicating features.  IMPRESSION: Well seated left total hip arthroplasty without complicating features.   Original Report Authenticated By: Rudie Meyer, M.D.    Dg Pelvis Portable  04/12/2012  *RADIOLOGY REPORT*  Clinical Data: Left hip arthroplasty  PORTABLE PELVIS  Comparison: 04/10/2012  Findings: Left hip total arthroplasty has been performed. Components appear aligned in the frontal plane.  Surgical drain in place.  Bony pelvis and right hip appear unremarkable.  IMPRESSION: Expected appearance status post left total hip arthroplasty.   Original Report Authenticated By: Judie Petit. Shick, M.D.     EKG: Orders placed in visit on 02/26/12  . EKG 12-LEAD     Hospital Course: Patient was admitted to [facility] and taken to the OR and underwent the above state procedure without complications.  Patient tolerated the procedure well and was later transferred to the recovery room and then to the orthopaedic floor for postoperative care.  They were given PO and IV analgesics for pain control following their surgery.  They were given 24 hours of postoperative antibiotics of  Anti-infectives   Start     Dose/Rate Route Frequency Ordered Stop   04/12/12 1330  ceFAZolin (ANCEF) IVPB 1 g/50 mL premix     1 g 100 mL/hr over 30 Minutes Intravenous Every 6 hours 04/12/12 1152 04/12/12 1946   04/12/12 0600  ceFAZolin (ANCEF) IVPB 2 g/50 mL premix     2 g 100 mL/hr over 30 Minutes Intravenous 60 min pre-op 04/11/12 1422 04/12/12 0730     and started on DVT prophylaxis in the form of Xarelto.   PT and OT were ordered for total hip protocol.  The patient was allowed to be WBAT with therapy. Discharge planning was consulted to help with postop disposition and equipment needs. Social worker consult was ordered for possible need of placement.  Patient had a decent night on the evening  of surgery.  They started to get up OOB with therapy on day one walking over 40 feet.  Hemovac drain was pulled without difficulty.  The knee immobilizer was removed and discontinued.  Continued to work with therapy into day two walking again over 40 feet.  It was noted that the patient had some confusion, mostly at night, but had improved some per her sister who was at the bedside with her on POD 2.  Dressing was changed on day two and the incision was healing well.  By day three, the patient had progressed slowly with therapy and had not demonstrated the ability to safely make it at home at that time.  Incision was healing well.  Patient was seen in rounds and was felt that she may need SNF still. Got social work involved again to arrange for SNF. Medically ready to go today, Monday 04/15/2012, if she gets a bed.     Discharge Medications: Prior to Admission  medications   Medication Sig Start Date End Date Taking? Authorizing Provider  carbidopa-levodopa (SINEMET IR) 25-100 MG per tablet Take 1 tablet by mouth 3 (three) times daily. Takes 9 am, 3 pm and 9 pm.  Patient requests we schedule these for these times.   Yes Historical Provider, MD  escitalopram (LEXAPRO) 20 MG tablet Take 20 mg by mouth daily.   Yes Historical Provider, MD  oxybutynin (DITROPAN-XL) 10 MG 24 hr tablet Take 10 mg by mouth daily.   Yes Historical Provider, MD  pantoprazole (PROTONIX) 40 MG tablet Take 40 mg by mouth daily. 11/22/11 11/21/12 Yes Pecola Lawless, MD  Ropinirole HCl (REQUIP XL) 12 MG TB24 Take 2 tablets by mouth at bedtime. Takes at 9 pm. Patient will bring in own med.   Yes Historical Provider, MD  diazepam (VALIUM) 5 MG tablet Take 1 tablet (5 mg total) by mouth every 6 (six) hours as needed. 04/13/12   Loanne Drilling, MD  oxyCODONE (OXY IR/ROXICODONE) 5 MG immediate release tablet Take 1-2 tablets (5-10 mg total) by mouth every 3 (three) hours as needed. 04/13/12   Loanne Drilling, MD  rivaroxaban (XARELTO) 10 MG  TABS tablet Take 1 tablet (10 mg total) by mouth daily with breakfast. Take Xarelto for two and a half more weeks, then discontinue Xarelto.  04/13/12   Loanne Drilling, MD  traMADol (ULTRAM) 50 MG tablet Take 1-2 tablets (50-100 mg total) by mouth every 6 (six) hours as needed. 04/13/12   Loanne Drilling, MD  zolpidem (AMBIEN) 10 MG tablet Take 10 mg by mouth at bedtime as needed for sleep.    Historical Provider, MD    Diet: Cardiac diet Activity:WBAT Follow-up:in 2 weeks Disposition - Pending At this Time - Waiting On Bed Placement Discharged Condition: Stable - Will transfer if bed available today at SNF.    Medication List    STOP taking these medications       HYDROcodone-acetaminophen 5-325 MG per tablet  Commonly known as:  NORCO/VICODIN      TAKE these medications       carbidopa-levodopa 25-100 MG per tablet  Commonly known as:  SINEMET IR  Take 1 tablet by mouth 3 (three) times daily. Takes 9 am, 3 pm and 9 pm.  Patient requests we schedule these for these times.     diazepam 5 MG tablet  Commonly known as:  VALIUM  Take 1 tablet (5 mg total) by mouth every 6 (six) hours as needed.     escitalopram 20 MG tablet  Commonly known as:  LEXAPRO  Take 20 mg by mouth daily.     oxybutynin 10 MG 24 hr tablet  Commonly known as:  DITROPAN-XL  Take 10 mg by mouth daily.     oxyCODONE 5 MG immediate release tablet  Commonly known as:  Oxy IR/ROXICODONE  Take 1-2 tablets (5-10 mg total) by mouth every 3 (three) hours as needed.     pantoprazole 40 MG tablet  Commonly known as:  PROTONIX  Take 40 mg by mouth daily.     REQUIP XL 12 MG Tb24  Generic drug:  Ropinirole HCl  Take 2 tablets by mouth at bedtime. Takes at 9 pm. Patient will bring in own med.     rivaroxaban 10 MG Tabs tablet  Commonly known as:  XARELTO  Take 1 tablet (10 mg total) by mouth daily with breakfast.     traMADol 50 MG tablet  Commonly known as:  ULTRAM  Take 1-2 tablets (50-100 mg total) by  mouth every 6 (six) hours as needed.     zolpidem 10 MG tablet  Commonly known as:  AMBIEN  Take 10 mg by mouth at bedtime as needed for sleep.           Follow-up Information   Follow up with Loanne Drilling, MD. Schedule an appointment as soon as possible for a visit on 04/30/2012. (Call [phone] tomorrow to make the appointment)    Contact information:   [address] [place] [address] [phone]       Signed: Patrica Duel 04/15/2012, 6:28 AM

## 2012-04-15 NOTE — Clinical Social Work Note (Addendum)
Clinical Social Work Department CLINICAL SOCIAL WORK PLACEMENT NOTE 04/15/2012  Patient:  Kristina Carr, Kristina Carr  Account Number:  [id number] Admit date:  04/12/2012  Clinical Social Worker:  Johnsie Cancel  Date/time:  04/15/2012 05:10 PM  Clinical Social Work is seeking post-discharge placement for this patient at the following level of care:   SKILLED NURSING   (*CSW will update this form in Epic as items are completed)   04/15/2012  Patient/family provided with [facility] Department of Clinical Social Work's list of facilities offering this level of care within the geographic area requested by the patient (or if unable, by the patient's family).  04/15/2012  Patient/family informed of their freedom to choose among providers that offer the needed level of care, that participate in Medicare, Medicaid or managed care program needed by the patient, have an available bed and are willing to accept the patient.  04/15/2012  Patient/family informed of [facility]' ownership interest in [facility], as well as of the fact that they are under no obligation to receive care at this facility.  PASARR submitted to [employer] on  04/17/2012  PASARR number received from [employer] on  04/17/2012   FL2 transmitted to all facilities in geographic area requested by pt/family on  04/15/2012 FL2 transmitted to all facilities within larger geographic area on N/A  Patient informed that his/her managed care company has contracts with or will negotiate with  certain facilities, including the following:     Patient/family informed of bed offers received:  04/16/2012 Patient chooses bed at [facility] Physician recommends and patient chooses bed at  N/A  Patient to be transferred to [facility] on 04/17/2012  Patient to be transferred to facility by [facility]  The following physician request were entered in Epic:   Additional Comments:  Lia Foyer, LCSWA [facility] Clinical Social  Worker Contact #: [phone]

## 2012-04-15 NOTE — Progress Notes (Signed)
Reviewed updated discharge plan with Inpatient Rehab consult recommended.  I agree with this change. Durenda Hurt Renaldo Fiddler, [facility] Acute Rehab Services Pager [phone]

## 2012-04-15 NOTE — Evaluation (Signed)
Occupational Therapy Evaluation Patient Details Name: Kristina Carr MRN: [id number] DOB: 1945-02-26 Today's Date: 04/15/2012 Time: 7829-5621 OT Time Calculation (min): 40 min  OT Assessment / Plan / Recommendation Clinical Impression  Pt demos decline in function with ADLs, strength, balance, safety and activity tolerance following L THA, pt very anxious during functional mobility. Pt would benefit from OT services to address these impairments to help retsore PLOF    OT Assessment  Patient needs continued OT Services    Follow Up Recommendations  CIR;SNF    Barriers to Discharge Decreased caregiver support Uncertain of pt's husband and sister will be able to provide adequate assist needed and if [facility] therapy will be enough  Equipment Recommendations       Recommendations for Other Services Rehab consult  Frequency  Min 2X/week    Precautions / Restrictions Precautions Precautions: None Precaution Comments: Direct Anterior Approach - no precautions Restrictions Weight Bearing Restrictions: Yes LLE Weight Bearing: Weight bearing as tolerated   Pertinent Vitals/Pain     ADL  Grooming: Performed;Wash/dry hands;Wash/dry face;Min guard Where Assessed - Grooming: Supported standing Upper Body Bathing: Simulated;Supervision/safety;Set up Lower Body Bathing: +1 Total assistance Upper Body Dressing: Performed;Supervision/safety;Set up Lower Body Dressing: +1 Total assistance Toilet Transfer: Performed;Minimal assistance Toilet Transfer Method: Sit to stand;Other (comment) (ambulating with RW) Toilet Transfer Equipment: Raised toilet seat with arms (or 3-in-1 over toilet) Toileting - Clothing Manipulation and Hygiene: Moderate assistance Where Assessed - Toileting Clothing Manipulation and Hygiene: Standing Equipment Used: Gait belt;Rolling walker (3 in 1) Transfers/Ambulation Related to ADLs: Pt required verbal and physical cues for correct hand placement ADL Comments: Pt  required increased time to complete ADL tasks due to pain and anxiety    OT Diagnosis: Generalized weakness;Acute pain  OT Problem List: Decreased strength;Decreased knowledge of use of DME or AE;Decreased activity tolerance;Decreased cognition;Decreased safety awareness;Impaired balance (sitting and/or standing);Pain OT Treatment Interventions: Self-care/ADL training;Balance training;Therapeutic exercise;Neuromuscular education;Therapeutic activities;DME and/or AE instruction;Patient/family education   OT Goals Acute Rehab OT Goals OT Goal Formulation: With patient/family Time For Goal Achievement: 04/22/12 Potential to Achieve Goals: Good ADL Goals Pt Will Perform Grooming: with set-up;with supervision;Standing at sink ADL Goal: Grooming - Progress: Goal set today Pt Will Perform Lower Body Bathing: with max assist;with mod assist;with adaptive equipment ADL Goal: Lower Body Bathing - Progress: Goal set today Pt Will Perform Lower Body Dressing: with mod assist;with max assist;with adaptive equipment ADL Goal: Lower Body Dressing - Progress: Goal set today Pt Will Transfer to Toilet: with supervision;with DME;Grab bars ADL Goal: Toilet Transfer - Progress: Goal set today Pt Will Perform Toileting - Clothing Manipulation: with min assist;Standing ADL Goal: Toileting - Clothing Manipulation - Progress: Goal set today Pt Will Perform Tub/Shower Transfer: with supervision;with min assist;with DME;Grab bars ADL Goal: Tub/Shower Transfer - Progress: Goal set today  Visit Information  Last OT Received On: 04/15/12 Assistance Needed: +1    Subjective Data  Subjective: " I have hallucinations adn lose my train of thought sometimes " Patient Stated Goal: To return home after CIR or ST SNF for therapy   Prior Functioning     Home Living Lives With: Spouse Available Help at Discharge: Family;Available 24 hours/day Type of Home: House Home Access: Level entry Home Layout: Two  level;Able to live on main level with bedroom/bathroom Bathroom Shower/Tub: Tub/shower unit;Walk-in shower Bathroom Toilet: Standard Bathroom Accessibility: Yes How Accessible: Accessible via walker Home Adaptive Equipment: Walker - rolling;Straight cane Prior Function Level of Independence: Independent Able to Take Stairs?: Yes  Driving: Yes Vocation: Retired Musician: No difficulties Dominant Hand: Right         Vision/Perception Vision - History Baseline Vision: Wears glasses only for reading Patient Visual Report: No change from baseline Perception Perception: Within Functional Limits   Cognition  Cognition Overall Cognitive Status: Appears within functional limits for tasks assessed/performed Area of Impairment: Memory;Awareness of errors;Attention;Safety/judgement Arousal/Alertness: Awake/alert Orientation Level: Appears intact for tasks assessed Behavior During Session: [facility] for tasks performed Current Attention Level: Selective Attention - Other Comments: Easily distracted.  Goes off topic easily. Memory Deficits: Decreased recall of information from prior session. Cognition - Other Comments: Pt appears anxious due to anticipatory pain. Pt very anxious throughout entire session, pt's nurse made aware    Extremity/Trunk Assessment Right Upper Extremity Assessment RUE ROM/Strength/Tone: [facility] for tasks assessed Left Upper Extremity Assessment LUE ROM/Strength/Tone: WFL for tasks assessed     Mobility Bed Mobility Bed Mobility: Not assessed Transfers Sit to Stand: 4: Min assist;With upper extremity assist;From elevated surface;From bed;From chair/3-in-1 Stand to Sit: 4: Min assist;With upper extremity assist;With armrests;To chair/3-in-1 Details for Transfer Assistance: (A) for stability and safety.  Cues for hand placement and technique.       Exercise     Balance Balance Balance Assessed: No   End of Session OT - End of Session Equipment  Utilized During Treatment: Gait belt (RW, 3 in 1) Activity Tolerance: Patient tolerated treatment well Patient left: in chair;with call bell/phone within reach;with family/visitor present Nurse Communication: Patient requests pain meds;Other (comment) (Pt's anxiety)  GO     Galen Manila 04/15/2012, 12:49 PM

## 2012-04-15 NOTE — Progress Notes (Signed)
Seen and agreed 04/15/2012 Fredrich Birks PTA [phone] pager [phone] office

## 2012-04-15 NOTE — Progress Notes (Signed)
Rehab Admissions Coordinator Note:  Patient was screened by Trish Mage for appropriateness for an Inpatient Acute Rehab Consult. Noted PT and OT recommending CIR.  Given that patient had an elective surgery, doubt that [employer] would approve an inpatient rehab admission.  Also, patient requiring minimum assist on initial evaluations.   At this time, we are recommending Skilled Nursing Facility versus home with [facility] therapies if patient progresses well.  Trish Mage 04/15/2012, 1:59 PM  I can be reached at [phone].

## 2012-04-15 NOTE — Progress Notes (Signed)
   Subjective: 3 Days Post-Op Procedure(s) (LRB): TOTAL HIP ARTHROPLASTY ANTERIOR APPROACH (Left) Patient reports pain as 4 on 0-10 scale.  . Plan is to go Skilled nursing facility after hospital stay. She has progressed slowly and has not demonstrated the ability to safely make it at home at this time.  Objective: Vital signs in last 24 hours: Temp:  [98.6 F (37 C)-99.3 F (37.4 C)] 99.3 F (37.4 C) (03/09 2133) Pulse Rate:  [67-90] 67 (03/09 2133) Resp:  [18] 18 (03/09 2133) BP: (109-116)/(60-64) 109/60 mmHg (03/09 2133) SpO2:  [94 %-96 %] 96 % (03/09 2133)  Intake/Output from previous day:  Intake/Output Summary (Last 24 hours) at 04/15/12 0622 Last data filed at 04/14/12 2135  Gross per 24 hour  Intake    240 ml  Output      0 ml  Net    240 ml    Intake/Output this shift: Total I/O In: 240 [P.O.:240] Out: -   Labs:  Recent Labs  04/13/12 0525 04/14/12 0630  HGB 11.2* 10.6*    Recent Labs  04/13/12 0525 04/14/12 0630  WBC 10.6* 9.6  RBC 3.60* 3.44*  HCT 32.4* 31.1*  PLT 264 236    Recent Labs  04/13/12 0525 04/14/12 0630  NA 140 140  K 3.7 3.5  CL 105 103  CO2 26 24  BUN 13 12  CREATININE 0.72 0.62  GLUCOSE 142* 139*  CALCIUM 9.0 8.8   No results found for this basename: LABPT, INR,  in the last 72 hours  EXAM General - Patient is Alert, Appropriate and Oriented Extremity - Neurologically intact Neurovascular intact Incision: dressing C/D/I No cellulitis present Compartment soft Dressing/Incision - clean, dry, no drainage Motor Function - intact, moving foot and toes well on exam.   Past Medical History  Diagnosis Date  . Hiatal hernia   . Esophageal reflux   . Personal history of other disorders of nervous system and sense organs   . Other and unspecified hyperlipidemia   . Abdominal pain, epigastric   . Flatulence, eructation, and gas pain   . Irritable bowel syndrome   . Pain in joint, site unspecified   . Other abnormal  glucose   . Abnormality of gait   . Cervicalgia   . Lumbago   . Nonspecific elevation of levels of transaminase or lactic acid dehydrogenase (LDH)   . Other malaise and fatigue   . Anxiety state, unspecified   . Palpitations   . Other chest pain   . Restless leg syndrome   . Other and unspecified hyperlipidemia   . Parkinson disease   . Parkinson's disease     Dr Zola Button, [facility]  . Detrusor instability   . Bladder cancer 2011    DR. GRAPEY  . PONV (postoperative nausea and vomiting)   . History of recurrent UTIs   . Arthritis     Assessment/Plan: 3 Days Post-Op Procedure(s) (LRB): TOTAL HIP ARTHROPLASTY ANTERIOR APPROACH (Left) Principal Problem:   OA (osteoarthritis) of hip   Up with therapy Get social work involved again to arrange for SNF. Medically ready to go today if she gets a bed.  DVT Prophylaxis - Xarelto Weight Bearing As Tolerated left Leg  ALUISIO,FRANK V 04/15/2012, 6:22 AM

## 2012-04-15 NOTE — Progress Notes (Signed)
Physical Therapy Treatment Patient Details Name: Kristina Carr MRN: [id number] DOB: January 25, 1946 Today's Date: 04/15/2012 Time: 4098-1191 PT Time Calculation (min): 45 min  PT Assessment / Plan / Recommendation Comments on Treatment Session  Pt's pain limiting overall mobility, especially in supine and supine <>sit.  Pt able to tolerate sit<>stand and ambulation better this session.    Follow Up Recommendations  CIR     Does the patient have the potential to tolerate intense rehabilitation     Barriers to Discharge        Equipment Recommendations  None recommended by PT    Recommendations for Other Services Rehab consult  Frequency 7X/week   Plan Discharge plan needs to be updated;Frequency remains appropriate    Precautions / Restrictions Precautions Precautions: None Precaution Comments: Direct Anterior Approach - no precautions Restrictions Weight Bearing Restrictions: Yes LLE Weight Bearing: Weight bearing as tolerated   Pertinent Vitals/Pain Pt bed mobility limited by pain.    Mobility  Bed Mobility Bed Mobility: Supine to Sit;Sitting - Scoot to Edge of Bed Supine to Sit: 3: Mod assist Sitting - Scoot to Edge of Bed: 3: Mod assist;Other (comment) (Pull pad used.) Details for Bed Mobility Assistance: (A) with trunk control and elevation.  Cues for technique and sequencing.  Pt limited by pain and anxiety. Transfers Transfers: Sit to Stand;Stand to Sit Sit to Stand: 4: Min assist;With upper extremity assist;From bed;From chair/3-in-1 Stand to Sit: 4: Min assist;Without upper extremity assist;To chair/3-in-1 Details for Transfer Assistance: (A) for stability and safety.  Cues for hand placement. Ambulation/Gait Ambulation/Gait Assistance: 4: Min assist Ambulation Distance (Feet): 60 Feet Assistive device: Rolling walker Ambulation/Gait Assistance Details: Cues for upright posture, looking forward & relaxing shoulders.  Cuing for increased step length. Gait  Pattern: Step-to pattern;Decreased step length - right;Decreased stance time - left;Trunk flexed Gait velocity: Decreased General Gait Details: Pt demonstrating decreased wt bearing on L LE.  Pt has difficulty intiating steps due to Parkinson's Stairs: No Wheelchair Mobility Wheelchair Mobility: No    Exercises Total Joint Exercises Quad Sets: Strengthening Gluteal Sets: Left;5 reps;Supine Heel Slides: AROM;Strengthening;Left;5 reps;Supine Hip ABduction/ADduction: AROM;Right;5 reps;Supine   PT Diagnosis:    PT Problem List:   PT Treatment Interventions:     PT Goals Acute Rehab PT Goals Pt will go Supine/Side to Sit: with supervision;with HOB 0 degrees PT Goal: Supine/Side to Sit - Progress: Progressing toward goal Pt will go Sit to Stand: with supervision;with upper extremity assist PT Goal: Sit to Stand - Progress: Progressing toward goal Pt will Ambulate: 51 - 150 feet;with supervision;with rolling walker PT Goal: Ambulate - Progress: Progressing toward goal Pt will Perform Home Exercise Program: with supervision, verbal cues required/provided PT Goal: Perform Home Exercise Program - Progress: Progressing toward goal  Visit Information  Last PT Received On: 04/15/12 Assistance Needed: +1    Subjective Data  Subjective: Complaints of radiating pain in L hip & front of L thigh   Cognition  Cognition Overall Cognitive Status: Appears within functional limits for tasks assessed/performed Area of Impairment: Memory;Awareness of errors;Attention;Safety/judgement Arousal/Alertness: Awake/alert Orientation Level: Appears intact for tasks assessed Behavior During Session: [facility] for tasks performed Current Attention Level: Selective Attention - Other Comments: Easily distracted.  Goes off topic easily. Memory Deficits: Decreased recall of information from prior session. Cognition - Other Comments: Pt appears anxious due to anticipatory pain.  Pt requires cues for technique.     Balance  Balance Balance Assessed: No  End of Session PT - End of  Session Equipment Utilized During Treatment: Gait belt Activity Tolerance: Patient limited by pain;Patient limited by fatigue Patient left: in chair;with call bell/phone within reach;with family/visitor present   GP     Enid Baas, SPTA 04/15/2012, 2:55 PM

## 2012-04-16 ENCOUNTER — Encounter (HOSPITAL_COMMUNITY): Payer: Self-pay | Admitting: General Practice

## 2012-04-16 NOTE — Progress Notes (Signed)
Physical Therapy Treatment Patient Details Name: Kristina Carr MRN: [id number] DOB: 1945/05/17 Today's Date: 04/16/2012 Time: 1914-7829 PT Time Calculation (min): 29 min  PT Assessment / Plan / Recommendation Comments on Treatment Session  Pt able to tolerate PT better this morning, but continues to be limited by pain.    Follow Up Recommendations  CIR     Does the patient have the potential to tolerate intense rehabilitation     Barriers to Discharge        Equipment Recommendations  None recommended by PT    Recommendations for Other Services Rehab consult  Frequency 7X/week   Plan Discharge plan needs to be updated;Frequency remains appropriate    Precautions / Restrictions Precautions Precautions: None Precaution Comments: Direct Anterior Approach - no precautions Restrictions Weight Bearing Restrictions: Yes LLE Weight Bearing: Weight bearing as tolerated   Pertinent Vitals/Pain Pt seemed less anxious about PT this session.  Still limited by pain.    Mobility  Bed Mobility Bed Mobility: Not assessed Transfers Transfers: Sit to Stand;Stand to Sit Sit to Stand: 4: Min guard;With upper extremity assist;From chair/3-in-1 Stand to Sit: 4: Min guard;With upper extremity assist;To chair/3-in-1 Details for Transfer Assistance: Cues for technique.  Pt impulsive, needs reminders for safety Ambulation/Gait Ambulation/Gait Assistance: 4: Min guard Ambulation Distance (Feet): 60 Feet Assistive device: Rolling walker Ambulation/Gait Assistance Details: Cues for upright posture, looking forward & relaxing shoulders.  Cues to increase R LE step length and for heel strike. Gait Pattern: Decreased step length - right;Decreased stance time - left;Decreased stride length;Decreased weight shift to left;Right foot flat;Trunk flexed Gait velocity: Decreased General Gait Details: Pt demonstrating decreased wt bearing on L LE.  Pt has difficulty intiating steps due to Parkinson's.   Pt cues for gait sequencing. Stairs: No Corporate treasurer: No    Exercises Total Joint Exercises Quad Sets: AROM;Strengthening;Both;10 reps Short Arc Quad: AAROM;Strengthening;Left;10 reps Hip ABduction/ADduction: AAROM;Strengthening;Left;10 reps   PT Diagnosis:    PT Problem List:   PT Treatment Interventions:     PT Goals Acute Rehab PT Goals Pt will go Sit to Stand: with supervision;with upper extremity assist PT Goal: Sit to Stand - Progress: Progressing toward goal Pt will Ambulate: 51 - 150 feet;with supervision;with rolling walker PT Goal: Ambulate - Progress: Progressing toward goal Pt will Perform Home Exercise Program: with supervision, verbal cues required/provided PT Goal: Perform Home Exercise Program - Progress: Progressing toward goal  Visit Information  Last PT Received On: 04/16/12 Assistance Needed: +1    Subjective Data  Subjective: Pt is waiting on pain meds but wants to walk this morning.   Cognition  Cognition Overall Cognitive Status: Appears within functional limits for tasks assessed/performed Area of Impairment: Memory;Awareness of errors;Attention;Safety/judgement Arousal/Alertness: Awake/alert Orientation Level: Appears intact for tasks assessed Behavior During Session: [facility] for tasks performed Memory Deficits: Decreased recall of information from prior session. Cognition - Other Comments: Pt appears anxious due to anticipatory pain but able to reassure her more easily this session.    Balance     End of Session PT - End of Session Equipment Utilized During Treatment: Gait belt Activity Tolerance: Patient tolerated treatment well;Patient limited by pain Patient left: in chair;with call bell/phone within reach;with family/visitor present   GP     Enid Baas, SPTA 04/16/2012, 8:47 AM

## 2012-04-16 NOTE — Progress Notes (Signed)
Seen and agreed 04/16/2012 Robinette, Julia Elizabeth PTA [phone] pager [phone] office    

## 2012-04-16 NOTE — Discharge Summary (Addendum)
ADDENDUM - Physician Discharge Summary   Patient ID: Kristina Carr MRN: [id number] DOB/AGE: 18-Mar-1945 67 y.o.  Admit date: 04/12/2012 Discharge date: 04/17/2012  Primary Diagnosis: Osteoarthritis of the Left hip  Admission Diagnoses:  Past Medical History  Diagnosis Date  . Hiatal hernia   . Esophageal reflux   . Personal history of other disorders of nervous system and sense organs   . Other and unspecified hyperlipidemia   . Abdominal pain, epigastric   . Flatulence, eructation, and gas pain   . Irritable bowel syndrome   . Pain in joint, site unspecified   . Other abnormal glucose   . Abnormality of gait   . Cervicalgia   . Lumbago   . Nonspecific elevation of levels of transaminase or lactic acid dehydrogenase (LDH)   . Other malaise and fatigue   . Anxiety state, unspecified   . Palpitations   . Other chest pain   . Restless leg syndrome   . Other and unspecified hyperlipidemia   . Parkinson disease   . Parkinson's disease     Dr Zola Button, [facility]  . Detrusor instability   . Bladder cancer 2011    DR. GRAPEY  . PONV (postoperative nausea and vomiting)   . History of recurrent UTIs   . Arthritis    Discharge Diagnoses:   Principal Problem:   OA (osteoarthritis) of hip  Estimated body mass index is 30.23 kg/(m^2) as calculated from the following:   Height as of 04/10/12: 5\' 4"  (1.626 m).   Weight as of 09/22/11: 79.924 kg (176 lb 3.2 oz).  Procedure(s) (LRB): TOTAL HIP ARTHROPLASTY ANTERIOR APPROACH (Left)   Consults: None  HPI: Kristina Carr is a 67 y.o. female who has advanced end-  stage arthritis of his Left hip with progressively worsening pain and  dysfunction.The patient has failed nonoperative management and presents for  total hip arthroplasty.   Laboratory Data: Admission on 04/12/2012  Component Date Value Range Status  . WBC 04/13/2012 10.6* 4.0 - 10.5 K/uL Final  . RBC 04/13/2012 3.60* 3.87 - 5.11 MIL/uL Final  . Hemoglobin 04/13/2012  11.2* 12.0 - 15.0 g/dL Final  . HCT 40/98/1191 32.4* 36.0 - 46.0 % Final  . MCV 04/13/2012 90.0  78.0 - 100.0 fL Final  . MCH 04/13/2012 31.1  26.0 - 34.0 pg Final  . MCHC 04/13/2012 34.6  30.0 - 36.0 g/dL Final  . RDW 47/82/9562 13.1  11.5 - 15.5 % Final  . Platelets 04/13/2012 264  150 - 400 K/uL Final  . Sodium 04/13/2012 140  135 - 145 mEq/L Final  . Potassium 04/13/2012 3.7  3.5 - 5.1 mEq/L Final  . Chloride 04/13/2012 105  96 - 112 mEq/L Final  . CO2 04/13/2012 26  19 - 32 mEq/L Final  . Glucose, Bld 04/13/2012 142* 70 - 99 mg/dL Final  . BUN 13/09/6576 13  6 - 23 mg/dL Final  . Creatinine, Ser 04/13/2012 0.72  0.50 - 1.10 mg/dL Final  . Calcium 46/96/2952 9.0  8.4 - 10.5 mg/dL Final  . GFR calc non Af Amer 04/13/2012 89* >90 mL/min Final  . GFR calc Af Amer 04/13/2012 >90  >90 mL/min Final   Comment:                                 The eGFR has been calculated  using the CKD EPI equation.                          This calculation has not been                          validated in all clinical                          situations.                          eGFR's persistently                          <90 mL/min signify                          possible Chronic Kidney Disease.  . WBC 04/14/2012 9.6  4.0 - 10.5 K/uL Final  . RBC 04/14/2012 3.44* 3.87 - 5.11 MIL/uL Final  . Hemoglobin 04/14/2012 10.6* 12.0 - 15.0 g/dL Final  . HCT 16/11/9602 31.1* 36.0 - 46.0 % Final  . MCV 04/14/2012 90.4  78.0 - 100.0 fL Final  . MCH 04/14/2012 30.8  26.0 - 34.0 pg Final  . MCHC 04/14/2012 34.1  30.0 - 36.0 g/dL Final  . RDW 54/10/8117 13.3  11.5 - 15.5 % Final  . Platelets 04/14/2012 236  150 - 400 K/uL Final  . Sodium 04/14/2012 140  135 - 145 mEq/L Final  . Potassium 04/14/2012 3.5  3.5 - 5.1 mEq/L Final  . Chloride 04/14/2012 103  96 - 112 mEq/L Final  . CO2 04/14/2012 24  19 - 32 mEq/L Final  . Glucose, Bld 04/14/2012 139* 70 - 99 mg/dL Final  . BUN 14/78/2956  12  6 - 23 mg/dL Final  . Creatinine, Ser 04/14/2012 0.62  0.50 - 1.10 mg/dL Final  . Calcium 21/30/8657 8.8  8.4 - 10.5 mg/dL Final  . GFR calc non Af Amer 04/14/2012 >90  >90 mL/min Final  . GFR calc Af Amer 04/14/2012 >90  >90 mL/min Final   Comment:                                 The eGFR has been calculated                          using the CKD EPI equation.                          This calculation has not been                          validated in all clinical                          situations.                          eGFR's persistently                          <90 mL/min signify  possible Chronic Kidney Disease.  . WBC 04/15/2012 8.5  4.0 - 10.5 K/uL Final  . RBC 04/15/2012 3.30* 3.87 - 5.11 MIL/uL Final  . Hemoglobin 04/15/2012 10.3* 12.0 - 15.0 g/dL Final  . HCT 16/11/9602 30.1* 36.0 - 46.0 % Final  . MCV 04/15/2012 91.2  78.0 - 100.0 fL Final  . MCH 04/15/2012 31.2  26.0 - 34.0 pg Final  . MCHC 04/15/2012 34.2  30.0 - 36.0 g/dL Final  . RDW 54/10/8117 13.3  11.5 - 15.5 % Final  . Platelets 04/15/2012 245  150 - 400 K/uL Final  Hospital Outpatient Visit on 04/10/2012  Component Date Value Range Status  . aPTT 04/10/2012 29  24 - 37 seconds Final  . WBC 04/10/2012 6.6  4.0 - 10.5 K/uL Final  . RBC 04/10/2012 4.79  3.87 - 5.11 MIL/uL Final  . Hemoglobin 04/10/2012 14.3  12.0 - 15.0 g/dL Final  . HCT 14/78/2956 42.1  36.0 - 46.0 % Final  . MCV 04/10/2012 87.9  78.0 - 100.0 fL Final  . MCH 04/10/2012 29.9  26.0 - 34.0 pg Final  . MCHC 04/10/2012 34.0  30.0 - 36.0 g/dL Final  . RDW 21/30/8657 13.0  11.5 - 15.5 % Final  . Platelets 04/10/2012 278  150 - 400 K/uL Final  . Sodium 04/10/2012 138  135 - 145 mEq/L Final  . Potassium 04/10/2012 4.1  3.5 - 5.1 mEq/L Final  . Chloride 04/10/2012 99  96 - 112 mEq/L Final  . CO2 04/10/2012 27  19 - 32 mEq/L Final  . Glucose, Bld 04/10/2012 98  70 - 99 mg/dL Final  . BUN 84/69/6295 16  6 - 23 mg/dL Final   . Creatinine, Ser 04/10/2012 0.80  0.50 - 1.10 mg/dL Final  . Calcium 28/41/3244 10.2  8.4 - 10.5 mg/dL Final  . Total Protein 04/10/2012 7.5  6.0 - 8.3 g/dL Final  . Albumin 02/08/7251 4.3  3.5 - 5.2 g/dL Final  . AST 66/44/0347 22  0 - 37 U/L Final  . ALT 04/10/2012 6  0 - 35 U/L Final  . Alkaline Phosphatase 04/10/2012 68  39 - 117 U/L Final  . Total Bilirubin 04/10/2012 0.3  0.3 - 1.2 mg/dL Final  . GFR calc non Af Amer 04/10/2012 76* >90 mL/min Final  . GFR calc Af Amer 04/10/2012 88* >90 mL/min Final   Comment:                                 The eGFR has been calculated                          using the CKD EPI equation.                          This calculation has not been                          validated in all clinical                          situations.                          eGFR's persistently                          <  90 mL/min signify                          possible Chronic Kidney Disease.  Marland Kitchen Prothrombin Time 04/10/2012 12.7  11.6 - 15.2 seconds Final  . INR 04/10/2012 0.96  0.00 - 1.49 Final  . ABO/RH(D) 04/10/2012 O POS   Final  . Antibody Screen 04/10/2012 NEG   Final  . Sample Expiration 04/10/2012 04/13/2012   Final  . Color, Urine 04/10/2012 YELLOW  YELLOW Final  . APPearance 04/10/2012 CLEAR  CLEAR Final  . Specific Gravity, Urine 04/10/2012 1.031* 1.005 - 1.030 Final  . pH 04/10/2012 5.0  5.0 - 8.0 Final  . Glucose, UA 04/10/2012 NEGATIVE  NEGATIVE mg/dL Final  . Hgb urine dipstick 04/10/2012 NEGATIVE  NEGATIVE Final  . Bilirubin Urine 04/10/2012 NEGATIVE  NEGATIVE Final  . Ketones, ur 04/10/2012 15* NEGATIVE mg/dL Final  . Protein, ur 29/52/8413 NEGATIVE  NEGATIVE mg/dL Final  . Urobilinogen, UA 04/10/2012 0.2  0.0 - 1.0 mg/dL Final  . Nitrite 24/40/1027 NEGATIVE  NEGATIVE Final  . Leukocytes, UA 04/10/2012 NEGATIVE  NEGATIVE Final   MICROSCOPIC NOT DONE ON URINES WITH NEGATIVE PROTEIN, BLOOD, LEUKOCYTES, NITRITE, OR GLUCOSE <1000 mg/dL.  Marland Kitchen  MRSA, PCR 04/10/2012 NEGATIVE  NEGATIVE Final  . Staphylococcus aureus 04/10/2012 NEGATIVE  NEGATIVE Final   Comment:                                 The Xpert SA Assay (FDA                          approved for NASAL specimens                          in patients over 29 years of age),                          is one component of                          a comprehensive surveillance                          program.  Test performance has                          been validated by [employer] for patients greater                          than or equal to 66 year old.                          It is not intended                          to diagnose infection nor to                          guide or monitor treatment.  Marland Kitchen  ABO/RH(D) 04/10/2012 O POS   Final  Office Visit on 02/26/2012  Component Date Value Range Status  . Total Bilirubin 02/26/2012 0.4  0.3 - 1.2 mg/dL Final  . Bilirubin, Direct 02/26/2012 0.0  0.0 - 0.3 mg/dL Final  . Alkaline Phosphatase 02/26/2012 57  39 - 117 U/L Final  . AST 02/26/2012 15  0 - 37 U/L Final  . ALT 02/26/2012 5  0 - 35 U/L Final  . Total Protein 02/26/2012 7.3  6.0 - 8.3 g/dL Final  . Albumin 45/40/9811 4.3  3.5 - 5.2 g/dL Final  . Sodium 91/47/8295 136  135 - 145 mEq/L Final  . Potassium 02/26/2012 4.0  3.5 - 5.1 mEq/L Final  . Chloride 02/26/2012 102  96 - 112 mEq/L Final  . CO2 02/26/2012 26  19 - 32 mEq/L Final  . Glucose, Bld 02/26/2012 106* 70 - 99 mg/dL Final  . BUN 62/13/0865 24* 6 - 23 mg/dL Final  . Creatinine, Ser 02/26/2012 0.8  0.4 - 1.2 mg/dL Final  . Calcium 78/46/9629 9.2  8.4 - 10.5 mg/dL Final  . GFR 52/84/1324 76.67  >60.00 mL/min Final  . Cholesterol 02/26/2012 209* 0 - 200 mg/dL Final   ATP III Classification       Desirable:  < 200 mg/dL               Borderline High:  200 - 239 mg/dL          High:  > = 401 mg/dL  . Triglycerides 02/26/2012 132.0  0.0 - 149.0 mg/dL Final   Normal:  <027  mg/dLBorderline High:  150 - 199 mg/dL  . HDL 02/26/2012 43.60  >39.00 mg/dL Final  . VLDL 25/36/6440 26.4  0.0 - 40.0 mg/dL Final  . Total CHOL/HDL Ratio 02/26/2012 5   Final                  Men          Women1/2 Average Risk     3.4          3.3Average Risk          5.0          4.42X Average Risk          9.6          7.13X Average Risk          15.0          11.0                      . TSH 02/26/2012 0.71  0.35 - 5.50 uIU/mL Final  . Hemoglobin A1C 02/26/2012 6.0  4.6 - 6.5 % Final   Glycemic Control Guidelines for People with Diabetes:Non Diabetic:  <6%Goal of Therapy: <7%Additional Action Suggested:  >8%   . Direct LDL 02/26/2012 135.1   Final   Optimal:  <100 mg/dLNear or Above Optimal:  100-129 mg/dLBorderline High:  130-159 mg/dLHigh:  160-189 mg/dLVery High:  >190 mg/dL     X-Rays:Dg Chest 2 View  04/10/2012  *RADIOLOGY REPORT*  Clinical Data: Pre admission film.  CHEST - 2 VIEW  Comparison: Chest x-ray 07/26/2011.  Findings: Lung volumes are low.  Small dense nodules in the right mid lung are compatible with calcified granulomas and are unchanged.  No other larger or suspicious appearing pulmonary nodules or masses are identified.  No acute consolidative airspace disease.  No pleural effusions.  Pulmonary vasculature and the cardiomediastinal silhouette are  within normal limits. Atherosclerosis in the thoracic aorta.  IMPRESSION: 1.  Low lung volumes without radiographic evidence of acute cardiopulmonary disease. 2.  Atherosclerosis. 3.  Old granulomatous disease, as above.   Original Report Authenticated By: Trudie Reed, M.D.    Dg Hip Complete Left  04/10/2012  *RADIOLOGY REPORT*  Clinical Data: Preoperative evaluation for left total hip replacement.  LEFT HIP - COMPLETE 2+ VIEW  Comparison: No priors.  Findings: AP view of the pelvis and AP and lateral views of the left hip demonstrate severe joint space narrowing, subchondral sclerosis, subchondral cyst formation and osteophyte  formation in the left hip joint, compatible with advanced osteoarthritis. Similar findings are present to a much lesser degree in the contralateral hip.  No acute displaced fracture.  The hip is located.  IMPRESSION: 1.  Advanced osteoarthritis of the left hip joint, as above.   Original Report Authenticated By: Trudie Reed, M.D.    Dg Hip Operative Left  04/12/2012  *RADIOLOGY REPORT*  Clinical Data: Left total hip arthroplasty.  OPERATIVE LEFT HIP  Comparison: 04/10/2012  Findings: The femoral and acetabular components are well seated. No complicating features.  IMPRESSION: Well seated left total hip arthroplasty without complicating features.   Original Report Authenticated By: Rudie Meyer, M.D.    Dg Pelvis Portable  04/12/2012  *RADIOLOGY REPORT*  Clinical Data: Left hip arthroplasty  PORTABLE PELVIS  Comparison: 04/10/2012  Findings: Left hip total arthroplasty has been performed. Components appear aligned in the frontal plane.  Surgical drain in place.  Bony pelvis and right hip appear unremarkable.  IMPRESSION: Expected appearance status post left total hip arthroplasty.   Original Report Authenticated By: Judie Petit. Shick, M.D.     EKG: Orders placed in visit on 02/26/12  . EKG 12-LEAD     Hospital Course: Please see original DC Summary for the first part of the hospital course (04/12/2012 - 04/15/2012) The patient was seen on POD 3, 04/15/12, and the social worker, after reconsultation, started to work on placement.  She had progressed slowly and had not demonstrated the ability to safely make it at home at that time.  The patient was originally seen by Child psychotherapist on POD 1, 04/13/2012: CSW consult for SNF.  PT recommendation for HHPT noted.  CSW signing off as no other CSW needs identified.  Dellie Burns, MSW, LCSWA She continued to work with therapy and walked about 60 feet.  Bed was not available on Monday.  She remained in hospital  On Tuesday, 04/16/2012, the patient stated that she was  feeling a little better.  Waiting on bed.  If bed becomes available, then will transfer over to SNF of choice.  Bed pending at time of this Addendum DC Summary. Unfortunately, arrangements had not been made final by the placement team so the patient stayed another day.  Seen again on Wed by Dr. Lequita Halt in the morning of 04/17/12.  The patient is doing better and ready to go.  Discharge Medications: Prior to Admission medications   Medication Sig Start Date End Date Taking? Authorizing Provider  carbidopa-levodopa (SINEMET IR) 25-100 MG per tablet Take 1 tablet by mouth 3 (three) times daily. Takes 9 am, 3 pm and 9 pm.  Patient requests we schedule these for these times.   Yes Historical Provider, MD  escitalopram (LEXAPRO) 20 MG tablet Take 20 mg by mouth daily.   Yes Historical Provider, MD  oxybutynin (DITROPAN-XL) 10 MG 24 hr tablet Take 10 mg by mouth daily.   Yes  Historical Provider, MD  pantoprazole (PROTONIX) 40 MG tablet Take 40 mg by mouth daily. 11/22/11 11/21/12 Yes Pecola Lawless, MD  Ropinirole HCl (REQUIP XL) 12 MG TB24 Take 2 tablets by mouth at bedtime. Takes at 9 pm. Patient will bring in own med.   Yes Historical Provider, MD  diazepam (VALIUM) 5 MG tablet Take 1 tablet (5 mg total) by mouth every 6 (six) hours as needed. 04/13/12   Loanne Drilling, MD  oxyCODONE (OXY IR/ROXICODONE) 5 MG immediate release tablet Take 1-2 tablets (5-10 mg total) by mouth every 3 (three) hours as needed. 04/13/12   Loanne Drilling, MD  rivaroxaban (XARELTO) 10 MG TABS tablet Take 1 tablet (10 mg total) by mouth daily with breakfast. Take Xarelto for two and a half more weeks, then discontinue Xarelto.  04/13/12   Loanne Drilling, MD  traMADol (ULTRAM) 50 MG tablet Take 1-2 tablets (50-100 mg total) by mouth every 6 (six) hours as needed. 04/13/12   Loanne Drilling, MD  zolpidem (AMBIEN) 10 MG tablet Take 10 mg by mouth at bedtime as needed for sleep.    Historical Provider, MD    Diet: Cardiac  diet Activity:WBAT Follow-up:in 2 weeks Disposition - Skilled nursing facility Pending At this Time - Waiting On Bed Placement Discharged Condition: stable - Will transfer if bed available today at SNF     Medication List    STOP taking these medications       HYDROcodone-acetaminophen 5-325 MG per tablet  Commonly known as:  NORCO/VICODIN      TAKE these medications       carbidopa-levodopa 25-100 MG per tablet  Commonly known as:  SINEMET IR  Take 1 tablet by mouth 3 (three) times daily. Takes 9 am, 3 pm and 9 pm.  Patient requests we schedule these for these times.     diazepam 5 MG tablet  Commonly known as:  VALIUM  Take 1 tablet (5 mg total) by mouth every 6 (six) hours as needed.     escitalopram 20 MG tablet  Commonly known as:  LEXAPRO  Take 20 mg by mouth daily.     oxybutynin 10 MG 24 hr tablet  Commonly known as:  DITROPAN-XL  Take 10 mg by mouth daily.     oxyCODONE 5 MG immediate release tablet  Commonly known as:  Oxy IR/ROXICODONE  Take 1-2 tablets (5-10 mg total) by mouth every 3 (three) hours as needed.     pantoprazole 40 MG tablet  Commonly known as:  PROTONIX  Take 40 mg by mouth daily.     REQUIP XL 12 MG Tb24  Generic drug:  Ropinirole HCl  Take 2 tablets by mouth at bedtime. Takes at 9 pm. Patient will bring in own med.     rivaroxaban 10 MG Tabs tablet  Commonly known as:  XARELTO  Take 1 tablet (10 mg total) by mouth daily with breakfast.     traMADol 50 MG tablet  Commonly known as:  ULTRAM  Take 1-2 tablets (50-100 mg total) by mouth every 6 (six) hours as needed.     zolpidem 10 MG tablet  Commonly known as:  AMBIEN  Take 10 mg by mouth at bedtime as needed for sleep.           Follow-up Information   Follow up with Loanne Drilling, MD. Schedule an appointment as soon as possible for a visit on 04/30/2012. (Call [phone] tomorrow to make the appointment)  Contact information:   [address], [address] [place] [address] [phone]       Signed: Patrica Duel 04/16/2012, 7:20 AM

## 2012-04-16 NOTE — Clinical Social Work Note (Signed)
Clinical Social Work Department BRIEF PSYCHOSOCIAL ASSESSMENT 04/16/2012  Patient:  Kristina Carr, Kristina Carr     Account Number:  [id number]     Admit date:  04/12/2012  Clinical Social Worker:  Johnsie Cancel  Date/Time:  04/16/2012 03:31 PM  Referred by:  Physician  Date Referred:  04/15/2012 Referred for  SNF Placement   Other Referral:   Interview type:  Family Other interview type:    PSYCHOSOCIAL DATA Living Status:  Kristina Carr: [phone] Primary support name:  Kristina Carr [phone]) Primary support relationship to patient:  SIBLING Degree of support available:   Adequate, at patient's bedside.    CURRENT CONCERNS Current Concerns  Post-Acute Placement   Other Concerns:    SOCIAL WORK ASSESSMENT / PLAN CSW consulted by MD re: SNF placement. CSW spoke to patient who requested CSW speak to her sister. CSW spoke to the sister and introduced self, explained role as a Child psychotherapist and explained the SNF process. The patient's sister stated she wanted the patient to go to [facility] at discharge. CSW encouraged the family to have a [facility], sister was agreeable. CSW contacted [facility] who stated they would be able to offer a bed, and started the insurance authorization. CSW will continue to follow patient for d/c needs.   Assessment/plan status:  Information/Referral to [facility] Other assessment/ plan:   Information/referral to community resources:    PATIENT'S/FAMILY'S RESPONSE TO PLAN OF CARE: Patient's sister thanked CSW for checking in, providing support and assisting in d/c planning.    Kristina Carr, LCSWA [facility] Clinical Social Worker Contact #: [phone]

## 2012-04-16 NOTE — Progress Notes (Signed)
   Subjective: 4 Days Post-Op Procedure(s) (LRB): TOTAL HIP ARTHROPLASTY ANTERIOR APPROACH (Left) Patient reports pain as mild and moderate.   Patient seen in rounds for Dr. Lequita Halt. Patient is well, but has had some minor complaints of pain in the hip, requiring pain medications Patient is ready to go to the SNF.  Objective: Vital signs in last 24 hours: Temp:  [98.2 F (36.8 C)-100.4 F (38 C)] 98.2 F (36.8 C) (03/11 0648) Pulse Rate:  [76-89] 76 (03/11 0648) Resp:  [18] 18 (03/11 0648) BP: (109-123)/(56-72) 123/72 mmHg (03/11 0648) SpO2:  [95 %-97 %] 97 % (03/11 0648)  Intake/Output from previous day:  Intake/Output Summary (Last 24 hours) at 04/16/12 0655 Last data filed at 04/16/12 0649  Gross per 24 hour  Intake    360 ml  Output      0 ml  Net    360 ml    Intake/Output this shift: Total I/O In: 360 [P.O.:360] Out: -   Labs:  Recent Labs  04/14/12 0630 04/15/12 0838  HGB 10.6* 10.3*    Recent Labs  04/14/12 0630 04/15/12 0838  WBC 9.6 8.5  RBC 3.44* 3.30*  HCT 31.1* 30.1*  PLT 236 245    Recent Labs  04/14/12 0630  NA 140  K 3.5  CL 103  CO2 24  BUN 12  CREATININE 0.62  GLUCOSE 139*  CALCIUM 8.8   No results found for this basename: LABPT, INR,  in the last 72 hours  EXAM: General - Patient is Alert, Appropriate and Oriented Extremity - Neurovascular intact Sensation intact distally Dorsiflexion/Plantar flexion intact Incision - clean, dry, no drainage, healing Motor Function - intact, moving foot and toes well on exam.   Assessment/Plan: 4 Days Post-Op Procedure(s) (LRB): TOTAL HIP ARTHROPLASTY ANTERIOR APPROACH (Left) Procedure(s) (LRB): TOTAL HIP ARTHROPLASTY ANTERIOR APPROACH (Left) Past Medical History  Diagnosis Date  . Hiatal hernia   . Esophageal reflux   . Personal history of other disorders of nervous system and sense organs   . Other and unspecified hyperlipidemia   . Abdominal pain, epigastric   . Flatulence,  eructation, and gas pain   . Irritable bowel syndrome   . Pain in joint, site unspecified   . Other abnormal glucose   . Abnormality of gait   . Cervicalgia   . Lumbago   . Nonspecific elevation of levels of transaminase or lactic acid dehydrogenase (LDH)   . Other malaise and fatigue   . Anxiety state, unspecified   . Palpitations   . Other chest pain   . Restless leg syndrome   . Other and unspecified hyperlipidemia   . Parkinson disease   . Parkinson's disease     Dr Zola Button, [facility]  . Detrusor instability   . Bladder cancer 2011    DR. GRAPEY  . PONV (postoperative nausea and vomiting)   . History of recurrent UTIs   . Arthritis    Principal Problem:   OA (osteoarthritis) of hip  Estimated body mass index is 30.23 kg/(m^2) as calculated from the following:   Height as of 04/10/12: 5\' 4"  (1.626 m).   Weight as of 09/22/11: 79.924 kg (176 lb 3.2 oz). Up with therapy Discharge to SNF Diet - Regular diet Follow up - in 2 weeks Activity - WBAT Disposition - Skilled nursing facility Condition Upon Discharge - Stable D/C Meds - See DC Summary DVT Prophylaxis - Xarelto  Kristina Carr, Kristina Carr 04/16/2012, 6:55 AM

## 2012-04-17 NOTE — Progress Notes (Signed)
   Subjective: 5 Days Post-Op Procedure(s) (LRB): TOTAL HIP ARTHROPLASTY ANTERIOR APPROACH (Left) Patient reports pain as 3 on 0-10 scale.  Feeling much better yesterday afternoon and last evening. Says she mobilized better to the bathroom last night Plan is to go Skilled nursing facility after hospital stay.  Objective: Vital signs in last 24 hours: Temp:  [98.2 F (36.8 C)-98.6 F (37 C)] 98.6 F (37 C) (03/11 1359) Pulse Rate:  [76-86] 86 (03/11 1359) Resp:  [16-18] 16 (03/11 1359) BP: (102-123)/(55-72) 102/55 mmHg (03/11 1359) SpO2:  [95 %-97 %] 95 % (03/11 1359)  Intake/Output from previous day:  Intake/Output Summary (Last 24 hours) at 04/17/12 0621 Last data filed at 04/17/12 0400  Gross per 24 hour  Intake    960 ml  Output      0 ml  Net    960 ml    Intake/Output this shift: Total I/O In: 240 [P.O.:240] Out: -   Labs:  Recent Labs  04/14/12 0630 04/15/12 0838  HGB 10.6* 10.3*    Recent Labs  04/14/12 0630 04/15/12 0838  WBC 9.6 8.5  RBC 3.44* 3.30*  HCT 31.1* 30.1*  PLT 236 245    Recent Labs  04/14/12 0630  NA 140  K 3.5  CL 103  CO2 24  BUN 12  CREATININE 0.62  GLUCOSE 139*  CALCIUM 8.8   No results found for this basename: LABPT, INR,  in the last 72 hours  EXAM General - Patient is Alert, Appropriate and Oriented. Appears much more comfortable Extremity - Neurologically intact Neurovascular intact Incision: dressing C/D/I No cellulitis present Compartment soft Dressing/Incision - clean, dry, no drainage Motor Function - intact, moving foot and toes well on exam.   Past Medical History  Diagnosis Date  . Hiatal hernia   . Esophageal reflux   . Personal history of other disorders of nervous system and sense organs   . Other and unspecified hyperlipidemia   . Abdominal pain, epigastric   . Flatulence, eructation, and gas pain   . Irritable bowel syndrome   . Pain in joint, site unspecified   . Other abnormal glucose   .  Abnormality of gait   . Cervicalgia   . Lumbago   . Nonspecific elevation of levels of transaminase or lactic acid dehydrogenase (LDH)   . Other malaise and fatigue   . Anxiety state, unspecified   . Palpitations   . Other chest pain   . Restless leg syndrome   . Other and unspecified hyperlipidemia   . Parkinson disease   . Parkinson's disease     Dr Zola Button, [facility]  . Detrusor instability   . Bladder cancer 2011    DR. GRAPEY  . PONV (postoperative nausea and vomiting)   . History of recurrent UTIs   . Arthritis     Assessment/Plan: 5 Days Post-Op Procedure(s) (LRB): TOTAL HIP ARTHROPLASTY ANTERIOR APPROACH (Left) Principal Problem:   OA (osteoarthritis) of hip   Discharge to SNF Should be ready for discharge today  DVT Prophylaxis - Xarelto Weight Bearing As Tolerated left Leg  ALUISIO,FRANK V 04/17/2012, 6:21 AM

## 2012-04-17 NOTE — Progress Notes (Signed)
Physical Therapy Treatment Patient Details Name: Kristina Carr MRN: [id number] DOB: 02-01-1946 Today's Date: 04/17/2012 Time: 1017-1050 PT Time Calculation (min): 33 min  PT Assessment / Plan / Recommendation Comments on Treatment Session  Patient progressing well today. Still requiring lots of cues for technique and safety    Follow Up Recommendations        Does the patient have the potential to tolerate intense rehabilitation     Barriers to Discharge        Equipment Recommendations  None recommended by PT    Recommendations for Other Services Rehab consult  Frequency     Plan Discharge plan needs to be updated;Frequency remains appropriate    Precautions / Restrictions Precautions Precautions: None Restrictions LLE Weight Bearing: Weight bearing as tolerated   Pertinent Vitals/Pain 5/10 hip pain, premedicated    Mobility  Bed Mobility Bed Mobility: Not assessed Transfers Sit to Stand: 4: Min guard;With upper extremity assist;From chair/3-in-1 Stand to Sit: 4: Min guard;With upper extremity assist;To chair/3-in-1 Details for Transfer Assistance: Cues for technique.  Pt impulsive, needs reminders for safety Ambulation/Gait Ambulation/Gait Assistance: 4: Min guard Ambulation Distance (Feet): 80 Feet Assistive device: Rolling walker Ambulation/Gait Assistance Details: Cue for bigger step with LLE and to heelstrike. Continuous cues forposture Gait Pattern: Decreased step length - right;Decreased stance time - left;Decreased stride length;Decreased weight shift to left;Right foot flat;Trunk flexed Gait velocity: Decreased    Exercises     PT Diagnosis:    PT Problem List:   PT Treatment Interventions:     PT Goals Acute Rehab PT Goals PT Goal: Sit to Stand - Progress: Progressing toward goal PT Goal: Ambulate - Progress: Progressing toward goal PT Goal: Perform Home Exercise Program - Progress: Progressing toward goal  Visit Information  Last PT  Received On: 04/17/12 Assistance Needed: +1    Subjective Data      Cognition  Cognition Overall Cognitive Status: Impaired Area of Impairment: Memory;Awareness of errors;Attention;Safety/judgement Orientation Level: Appears intact for tasks assessed Behavior During Session: [facility] for tasks performed Current Attention Level: Selective Attention - Other Comments: Easily distracted.  Goes off topic easily.    Balance     End of Session PT - End of Session Equipment Utilized During Treatment: Gait belt Activity Tolerance: Patient tolerated treatment well;Patient limited by pain Patient left: in chair;with call bell/phone within reach;with family/visitor present Nurse Communication: Mobility status   GP     Fredrich Birks 04/17/2012, 11:39 AM

## 2012-04-18 NOTE — Clinical Social Work Note (Signed)
CSW was consulted to complete discharge of patient. Pt to transfer to [facility] today via [facility]. [facility] and family is aware of d/c. D/C packet complete with chart copy, signed FL2, and signed hard Rx.  CSW signing off as no other CSW needs identified at this time.  Lia Foyer, LCSWA [facility] Clinical Social Worker Contact #: [phone]

## 2012-04-23 ENCOUNTER — Non-Acute Institutional Stay (SKILLED_NURSING_FACILITY): Payer: BC Managed Care – PPO | Admitting: Internal Medicine

## 2012-04-23 ENCOUNTER — Other Ambulatory Visit: Payer: Self-pay | Admitting: *Deleted

## 2012-04-23 DIAGNOSIS — M1612 Unilateral primary osteoarthritis, left hip: Secondary | ICD-10-CM

## 2012-04-23 DIAGNOSIS — K219 Gastro-esophageal reflux disease without esophagitis: Secondary | ICD-10-CM

## 2012-04-23 DIAGNOSIS — M169 Osteoarthritis of hip, unspecified: Secondary | ICD-10-CM

## 2012-04-23 DIAGNOSIS — D62 Acute posthemorrhagic anemia: Secondary | ICD-10-CM

## 2012-04-23 DIAGNOSIS — G20 Parkinson's disease: Secondary | ICD-10-CM

## 2012-04-23 MED ORDER — OXYCODONE HCL 5 MG PO TABS: ORAL_TABLET | ORAL | Status: DC

## 2012-04-24 NOTE — Progress Notes (Signed)
HISTORY & PHYSICAL  FACILITY: [facility]  LEVEL OF SERVICE: skilled  DATE: 04/23/12    ALLERGIES: prilosec Allergies  Allergen Reactions  . Omeprazole     REACTION: HIVES  . Simvastatin     REACTION: LEG CRAMPS  . Venlafaxine     REACTION: nausea    CHIEF COMPLAINT:  Manage left hip OA, PD & GERD  HISTORY OF PRESENT ILLNESS:  67 y/o Caucasian female was having HIP OSTEOARTHRITIS: patient had advanced end stage OA of the hip with progressively worsening pain & dysfunction.  Pt failed non-surgical conservative management.  Therefore pt underwent total hip arthroplasty & tolerated the procedure well.  Pt denies hip pain currently.  Pt was admitted to this facility for short term rehabilitation.  Pt's sister at bedsides states that pt's mental status is not very clear & would like her oxycodone to be decreased.  PARKINSON'S DISEASE: pt's PD is stable.  Denies progression of sx recently.  Pt is tolerating PD meds without any complications.  GERD: pt's GERD is stable.  Denies ongoing heartburn, abd. Pain, nausea or vomiting.  Currently on a PPI & tolerates it without any adverse reactions.    PAST MEDICAL HISTORY :  Past Medical History  Diagnosis Date  . Hiatal hernia   . Esophageal reflux   . Personal history of other disorders of nervous system and sense organs   . Other and unspecified hyperlipidemia   . Abdominal pain, epigastric   . Flatulence, eructation, and gas pain   . Irritable bowel syndrome   . Pain in joint, site unspecified   . Other abnormal glucose   . Abnormality of gait   . Cervicalgia   . Lumbago   . Nonspecific elevation of levels of transaminase or lactic acid dehydrogenase (LDH)   . Other malaise and fatigue   . Anxiety state, unspecified   . Palpitations   . Other chest pain   . Restless leg syndrome   . Other and unspecified hyperlipidemia   . Parkinson disease   . Parkinson's disease     Dr Zola Button, [facility]  . Detrusor instability   .  Bladder cancer 2011    DR. GRAPEY  . PONV (postoperative nausea and vomiting)   . History of recurrent UTIs   . Arthritis       PAST SURGICAL HISTORY: Past Surgical History  Procedure Laterality Date  . Appendectomy    . Lumbar laminectomy      X 3; last @ age34  . Bunionectomy      left  . Trigger finger release      X2  . Bladder tumor excision    . Cesarean section    . Chemotherapy      FLUSH-CANCER EXCISED CYSTOSCOPICALLY FOLLOWED BY INSTALLATION OF CHEMOTHERAPY.  . Breast surgery      Biopsy-benign  . Colonoscopy      neg; Dr Jarold Motto  . Total hip arthroplasty Left 04/12/2012    Procedure: TOTAL HIP ARTHROPLASTY ANTERIOR APPROACH;  Surgeon: Loanne Drilling, MD;  Location: [facility] OR;  Service: Orthopedics;  Laterality: Left;  . Total hip arthroplasty Left 04/12/2012    Dr Despina Hick    SOCIAL HISTORY:  reports that she has never smoked. She has never used smokeless tobacco. She reports that  drinks alcohol. She reports that she does not use illicit drugs.   FAMILY HISTORY:  Family History  Problem Relation Age of Onset  . Lung cancer Father   . Colon cancer Mother   .  Hypertension Mother   . Breast cancer Mother 59  . Stomach cancer Maternal Grandmother   . Osteoporosis Maternal Grandmother   . Breast cancer Maternal Aunt 30  . Prostate cancer Paternal Grandfather   . Prostate cancer      Paternal Elio Forget  . Prostate cancer Maternal Grandfather   . Diabetes Neg Hx   . Stroke Neg Hx   . Heart attack Neg Hx     CURRENT MEDICATIONS: Reviewed per [facility]  REVIEW OF SYSTEMS:  See HPI otherwise 14 point ROS is negative.   PHYSICAL EXAMINATION  VS:  T  96.2            P 68           RR  18       BP 103/66            POX% 96                WT (Lb)  GENERAL: no acute distress, normal body habitus SKIN: warm & dry,, no suspicious lesions or rashes, no excessive dryness. EYES: conjunctivae normal, sclerae normal, normal eye lids MOUTH/THROAT: lips without  lesions,,no lesions in the mouth,tongue is without lesions,uvula elevates in midline NECK: supple, trachea midline, no neck masses, no thyroid tenderness, no thyromegaly LYMPHATICS: no LAN in the neck, no supraclavicular LAN RESPIRATORY: breathing is even & unlabored, BS CTAB CARDIAC: RRR, no murmur,no extra heart sounds, no edema GI:  ABDOMEN: abdomen soft, normal BS, no masses, no tenderness  LIVER/SPLEEN: no hepatomegaly, no splenomegaly MUSCULOSKELETAL: HEAD: normal to inspection & palpation BACK: no kyphosis, scoliosis or spinal processes tenderness EXTREMITIES: LEFT UPPER EXTREMITY: full range of motion, normal strength & tone RIGHT UPPER EXTREMITY:  full range of motion, normal strength & tone LEFT LOWER EXTREMITY:  full range of motion, normal strength & tone RIGHT LOWER EXTREMITY:  moderatel range of motion, decreased strength & normal tone PSYCHIATRIC: the patient is alert & oriented x 3 , affect & behavior appropriate  LABS/RADIOLOGY:  Dg Chest 2 View  04/10/2012  *RADIOLOGY REPORT*  Clinical Data: Pre admission film.  CHEST - 2 VIEW  Comparison: Chest x-ray 07/26/2011.  Findings: Lung volumes are low.  Small dense nodules in the right mid lung are compatible with calcified granulomas and are unchanged.  No other larger or suspicious appearing pulmonary nodules or masses are identified.  No acute consolidative airspace disease.  No pleural effusions.  Pulmonary vasculature and the cardiomediastinal silhouette are within normal limits. Atherosclerosis in the thoracic aorta.  IMPRESSION: 1.  Low lung volumes without radiographic evidence of acute cardiopulmonary disease. 2.  Atherosclerosis. 3.  Old granulomatous disease, as above.   Original Report Authenticated By: Trudie Reed, M.D.    Dg Hip Complete Left  04/10/2012  *RADIOLOGY REPORT*  Clinical Data: Preoperative evaluation for left total hip replacement.  LEFT HIP - COMPLETE 2+ VIEW  Comparison: No priors.  Findings: AP view  of the pelvis and AP and lateral views of the left hip demonstrate severe joint space narrowing, subchondral sclerosis, subchondral cyst formation and osteophyte formation in the left hip joint, compatible with advanced osteoarthritis. Similar findings are present to a much lesser degree in the contralateral hip.  No acute displaced fracture.  The hip is located.  IMPRESSION: 1.  Advanced osteoarthritis of the left hip joint, as above.   Original Report Authenticated By: Trudie Reed, M.D.    Left hip x-ray on 04-12-12 showed well-seated left  hip arthroplasty without  complicating features.  04/12/12 pelvic x-ray showed expected appearance status post left total hip arthroplasty.  04-14-12 hemoglobin 10.6, MCV 90.4 otherwise CBC normal, glucose 139 otherwise BMP normal 04-15-12 hemoglobin 10.3 MCV 91.2 otherwise CBC normal 04-10-12 PTT 29, CMP normal, urinalysis negative, MRSA by PCR negative, staph aureus by PCR negative 02-26-12 TSH 0.71, hemoglobin A1c 6, total cholesterol 209, triglycerides 13,2 HDL 43, LDL 135   ASSESSMENT/PLAN:  1) Left hip OA:  S/p total hip arthroplasty.  Cont. Rehab. 2) PD- stable. 3) GERD- stable. 4) acute blood loss anemia- recheck Hb. 5) decreased MS- decrease oxycodone to 5mg  q4 prn. 6) check cbc & bmp.  I have reviewed patient's medical records received from hospitalization.  CPT code:  16109

## 2012-05-02 ENCOUNTER — Non-Acute Institutional Stay (SKILLED_NURSING_FACILITY): Payer: BC Managed Care – PPO | Admitting: Adult Health

## 2012-05-02 DIAGNOSIS — M545 Low back pain: Secondary | ICD-10-CM

## 2012-05-02 DIAGNOSIS — M169 Osteoarthritis of hip, unspecified: Secondary | ICD-10-CM

## 2012-05-02 DIAGNOSIS — G20 Parkinson's disease: Secondary | ICD-10-CM

## 2012-05-02 DIAGNOSIS — N3281 Overactive bladder: Secondary | ICD-10-CM

## 2012-05-02 DIAGNOSIS — K219 Gastro-esophageal reflux disease without esophagitis: Secondary | ICD-10-CM

## 2012-05-02 DIAGNOSIS — N318 Other neuromuscular dysfunction of bladder: Secondary | ICD-10-CM

## 2012-05-02 DIAGNOSIS — F411 Generalized anxiety disorder: Secondary | ICD-10-CM

## 2012-05-02 DIAGNOSIS — Z8669 Personal history of other diseases of the nervous system and sense organs: Secondary | ICD-10-CM

## 2012-05-03 ENCOUNTER — Encounter: Payer: Self-pay | Admitting: Adult Health

## 2012-05-03 NOTE — Progress Notes (Signed)
  Subjective:    Patient ID: Kristina Carr, female    DOB: 1945-09-19, 67 y.o.   MRN: [id number]  HPI This is a 67 year old Caucasian female who is for discharge home with all medications including narcotics. She will be followed-up by Home health PT, OT and Nursing. DME:  Rolling walker due to unsteady gait. She was admitted to [facility] on 04/17/12 from [facility]  with Osteoarthritis S/P left total hip arthroplasty. She has been admitted for a short-term rehabilitation.   Review of Systems  Constitutional: Negative.   Eyes: Negative.   Respiratory: Negative for chest tightness and shortness of breath.   Cardiovascular: Positive for leg swelling.  Gastrointestinal: Negative.   Endocrine: Negative.   Genitourinary: Negative.   Neurological: Negative for dizziness and headaches.  Hematological: Negative for adenopathy. Does not bruise/bleed easily.  Psychiatric/Behavioral: Negative.        Objective:   Physical Exam  Constitutional: She is oriented to person, place, and time. She appears well-developed and well-nourished.  HENT:  Head: Normocephalic and atraumatic.  Right Ear: External ear normal.  Left Ear: External ear normal.  Eyes: Conjunctivae are normal. Pupils are equal, round, and reactive to light.  Neck: Normal range of motion. Neck supple.  Cardiovascular: Normal rate, regular rhythm and normal heart sounds.   Pulmonary/Chest: Effort normal and breath sounds normal.  Abdominal: Soft. Bowel sounds are normal.  Musculoskeletal: Normal range of motion. She exhibits edema. She exhibits no tenderness.  LLE edema , 2+  Neurological: She is alert and oriented to person, place, and time.  Skin: Skin is warm and dry.  Psychiatric: She has a normal mood and affect. Her behavior is normal. Judgment and thought content normal.          Assessment & Plan:  ANXIETY STATE, UNSPECIFIED - stable  ACID REFLUX DISEASE -stable  LOW BACK PAIN, CHRONIC  -stable  RESTLESS LEG SYNDROME, HX OF -stable  PARKINSON'S DISEASE - stable  Urinary Frequency - stable  OA (osteoarthritis) of hip S/P Left total hip arthroplasty - for Home health PT, OT and Nursing  Depression - stable  Insomnia - stable

## 2012-05-27 ENCOUNTER — Telehealth: Payer: Self-pay | Admitting: Internal Medicine

## 2012-05-27 ENCOUNTER — Encounter: Payer: Self-pay | Admitting: *Deleted

## 2012-05-27 NOTE — Telephone Encounter (Signed)
This is fine if they have documented B12 deficiency. I have not seen any paperwork

## 2012-05-27 NOTE — Telephone Encounter (Signed)
[facility] please advise  

## 2012-05-27 NOTE — Telephone Encounter (Signed)
Discuss with patient, appointment scheduled.

## 2012-05-27 NOTE — Telephone Encounter (Signed)
Patient states that her neurologist was supposed to send over paperwork stating that they recommend patient get a B12 inj. Patient wants to know if we received paperwork on this so she can schedule an appt?  [phone] Or [phone]

## 2012-05-27 NOTE — Telephone Encounter (Signed)
Left message to call office

## 2012-05-28 ENCOUNTER — Ambulatory Visit (INDEPENDENT_AMBULATORY_CARE_PROVIDER_SITE_OTHER): Payer: BC Managed Care – PPO | Admitting: *Deleted

## 2012-05-28 DIAGNOSIS — E538 Deficiency of other specified B group vitamins: Secondary | ICD-10-CM

## 2012-05-28 MED ORDER — CYANOCOBALAMIN 1000 MCG/ML IJ SOLN
1000.0000 ug | Freq: Once | INTRAMUSCULAR | Status: AC
  Administered 2012-05-28: 1000 ug via INTRAMUSCULAR

## 2012-05-29 ENCOUNTER — Other Ambulatory Visit: Payer: Self-pay | Admitting: Internal Medicine

## 2012-05-29 DIAGNOSIS — E538 Deficiency of other specified B group vitamins: Secondary | ICD-10-CM | POA: Insufficient documentation

## 2012-06-19 NOTE — Telephone Encounter (Signed)
Pt notified to continue with B12 injections. Pt has an  appt with [facility] on 5/20 to discuss breast issues. Would like to get her next shot then if possible.

## 2012-06-19 NOTE — Telephone Encounter (Signed)
B12 shots thousand micrograms monthly x6 then repeat B12 level.

## 2012-06-19 NOTE — Telephone Encounter (Signed)
Pt called today stated that she was given a B12 injection at our office at the insistence of her Neurologist. Found the labwork provided that shows pt B12 level placed on your counter for review. Please advise if Pt will need continual B12 injections or what the next step should be?

## 2012-06-25 ENCOUNTER — Encounter: Payer: Self-pay | Admitting: Internal Medicine

## 2012-06-25 ENCOUNTER — Ambulatory Visit (INDEPENDENT_AMBULATORY_CARE_PROVIDER_SITE_OTHER): Payer: BC Managed Care – PPO | Admitting: Internal Medicine

## 2012-06-25 VITALS — BP 130/74 | HR 79 | Temp 98.1°F | Wt 172.0 lb

## 2012-06-25 DIAGNOSIS — N6011 Diffuse cystic mastopathy of right breast: Secondary | ICD-10-CM

## 2012-06-25 DIAGNOSIS — N6019 Diffuse cystic mastopathy of unspecified breast: Secondary | ICD-10-CM

## 2012-06-25 DIAGNOSIS — R5381 Other malaise: Secondary | ICD-10-CM

## 2012-06-25 DIAGNOSIS — E538 Deficiency of other specified B group vitamins: Secondary | ICD-10-CM

## 2012-06-25 MED ORDER — CYANOCOBALAMIN 1000 MCG/ML IJ SOLN: 1000.0000 ug | Freq: Once | INTRAMUSCULAR | Status: DC

## 2012-06-25 MED ORDER — CYANOCOBALAMIN 1000 MCG/ML IJ SOLN
1000.0000 ug | Freq: Once | INTRAMUSCULAR | Status: AC
  Administered 2012-06-25: 1000 ug via INTRAMUSCULAR

## 2012-06-25 MED ORDER — CYANOCOBALAMIN 1000 MCG/ML IJ SOLN: 1000.0000 ug | Freq: Once | INTRAMUSCULAR | Status: AC

## 2012-06-25 NOTE — Assessment & Plan Note (Signed)
B12 1000 mcg will be given for the next 4 days and then monthly thereafter. After 6 months of supplementation; B12 level should be rechecked

## 2012-06-25 NOTE — Progress Notes (Signed)
  Subjective:    Patient ID: Kristina Carr, female    DOB: 1945-11-17, 67 y.o.   MRN: [id number]  HPI   She describes discomfort in the right lateral breast for several months intermittently. It can last seconds-minutes. It tends to be localized and nonradiating. She does describe it as throbbing on occasion. It is sore to palpation.  She thought she had had mammograms within the last several months. Actually the diagnostic mammogram and ultrasound were performed 09/22/11. These were negative.  She does have a past history of biopsy which was negative. She does not ingest caffeine except chocolate 2 days/ week. She does not have discharge or bleeding from the breast.    Review of Systems  She denies fever, chills, sweats unexplained weight loss.  She was diagnosed with B12 deficiency at [facility]. She was to take B12 shots daily x5 then monthly thereafter. Inadvertently she received only the first shot. She had an upper endoscopy which revealed hiatal hernia. There is no history of gastric atrophy.     Objective:   Physical Exam  General appearance is one of good health and nourishment w/o distress.  Eyes: No conjunctival inflammation or scleral icterus is present.  Oral exam: Dental hygiene is good; lips and gums are healthy appearing.There is no oropharyngeal erythema or exudate noted.   Heart:  Normal rate and regular rhythm. S1 and S2 normal without gallop, murmur, click, rub or other extra sounds .S4   Lungs:Chest clear to auscultation; no wheezes, rhonchi,rales ,or rubs present.No increased work of breathing.   Abdomen: bowel sounds normal, soft and non-tender without masses, organomegaly or hernias noted.  No guarding or rebound   Skin:Warm & dry.  Intact without suspicious lesions or rashes ; no jaundice or tenting  Lymphatic: No lymphadenopathy is noted about the head, neck, axilla.   Breast tissue is especially dense. Fibrocystic changes are present diffusely.  One area is tender in the right lower lateral quadrant of the right breast.            Assessment & Plan:  See Current Assessment & Plan in Problem List under specific Diagnosis

## 2012-06-25 NOTE — Patient Instructions (Addendum)
Caffeine ( coffee, tea, cola, chocolate ) can increase fibrocystic changes. Avoid thse to excess as much as  possible. Consider vitamin E 400 international units daily if having active symptoms. B12 supplementation should be continued, daily X 4 then monthly thereafter.B12 level after 6 months Please check with your insurance to see if it will  cover the nasally inhaled B12 ( Nascobal)

## 2012-06-25 NOTE — Assessment & Plan Note (Signed)
Diagnostic mammography will be repeated. She'll be asked to restrict caffeine as much is possible. One option is 400 international units daily of Vitamin E

## 2012-06-26 ENCOUNTER — Ambulatory Visit: Payer: BC Managed Care – PPO

## 2012-06-27 ENCOUNTER — Ambulatory Visit: Payer: BC Managed Care – PPO

## 2012-06-28 ENCOUNTER — Ambulatory Visit: Payer: BC Managed Care – PPO

## 2012-07-04 ENCOUNTER — Ambulatory Visit
Admission: RE | Admit: 2012-07-04 | Discharge: 2012-07-04 | Disposition: A | Discharge status: Home / Self Care | Payer: BC Managed Care – PPO | Source: Ambulatory Visit | Attending: Internal Medicine | Admitting: Internal Medicine

## 2012-07-04 DIAGNOSIS — N6011 Diffuse cystic mastopathy of right breast: Secondary | ICD-10-CM

## 2012-07-10 ENCOUNTER — Other Ambulatory Visit: Payer: BC Managed Care – PPO

## 2012-07-29 ENCOUNTER — Ambulatory Visit: Payer: BC Managed Care – PPO

## 2012-09-20 ENCOUNTER — Telehealth: Payer: Self-pay | Admitting: Gastroenterology

## 2012-09-23 NOTE — Telephone Encounter (Signed)
Pt last seen 05/31/10. 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 Pt offered a referral to [facility] on 06/17/10. lmom for pt to call back.

## 2012-09-25 ENCOUNTER — Ambulatory Visit: Payer: BC Managed Care – PPO | Admitting: Internal Medicine

## 2012-09-25 DIAGNOSIS — Z0289 Encounter for other administrative examinations: Secondary | ICD-10-CM

## 2012-10-01 ENCOUNTER — Ambulatory Visit: Payer: BC Managed Care – PPO | Attending: Internal Medicine | Admitting: Physical Therapy

## 2012-10-02 ENCOUNTER — Ambulatory Visit (INDEPENDENT_AMBULATORY_CARE_PROVIDER_SITE_OTHER): Payer: BC Managed Care – PPO | Admitting: Nurse Practitioner

## 2012-10-02 ENCOUNTER — Ambulatory Visit: Payer: BC Managed Care – PPO | Admitting: *Deleted

## 2012-10-02 DIAGNOSIS — G2581 Restless legs syndrome: Secondary | ICD-10-CM

## 2012-10-02 DIAGNOSIS — M5380 Other specified dorsopathies, site unspecified: Secondary | ICD-10-CM

## 2012-10-02 DIAGNOSIS — M62830 Muscle spasm of back: Secondary | ICD-10-CM

## 2012-10-02 DIAGNOSIS — Z7189 Other specified counseling: Secondary | ICD-10-CM

## 2012-10-02 MED ORDER — DIAZEPAM 5 MG PO TABS: 5.0000 mg | ORAL_TABLET | Freq: Two times a day (BID) | ORAL | Status: DC | PRN

## 2012-10-02 NOTE — Progress Notes (Signed)
Subjective:     Kristina Carr is a 67 y.o. female who presents for evaluation of fatigue and low BP at home. She is accompanied by her sister. She may be a poor historian-as she is redirected often by her sister regarding chronological events. She has Parkinson's and is treated by a neurologist. She states fatigue began several months ago and has become accompanied by extreme exhaustion-collapsing on the couch. Episodes are associated with little activity such as walking from the car to the house or washing dishes. She has had some medication changes in the last 30 days-Paxil and Enablex have been added by neurology and urology. She is uncertain if she has discontinued oxybutynin and Lexapro.  A recent fatigue episode was accompanied by a low bp at home 85/60 (this pressure was taken by her sister). Episodes are not associated with nausea, sweating, palpitaions, chest pain or discomfort.  The following portions of the patient's history were reviewed and updated as appropriate: allergies, current medications, past medical history, past social history, past surgical history and problem list.  Review of Systems Constitutional: positive for fatigue, negative for chills, fevers, night sweats and weight loss Eyes: negative for visual disturbance Respiratory: negative for cough and sputum Cardiovascular: positive for palpitations, negative for chest pain, chest pressure/discomfort, lower extremity edema and near-syncope Gastrointestinal: positive for dyspepsia Genitourinary:positive for nocturia Musculoskeletal:negative for arthralgias and has occasional back pain, for which valium helps. Neurological: positive for gait problems and memory problems, negative for headaches, paresthesia, seizures and speech problems Behavioral/Psych: positive for anxiety and sleep disturbance, negative for abusive relationship, excessive alcohol consumption, irritability and tobacco use    Objective:    There were  no vitals taken for this visit. General appearance: alert, cooperative, appears stated age, slowed mentation and trouble recalling symptoms/events, not sure what meds she should be taking, is assisted by sister today to give history Head: Normocephalic, without obvious abnormality, atraumatic Eyes: conjunctivae/corneas clear. PERRL, EOM's intact. Fundi benign. Lungs: clear to auscultation bilaterally Heart: regular rate and rhythm, S1, S2 normal, no murmur, click, rub or gallop   Extremities: no varicosities, no edema Assessment:    fatigue may be r/t recent med changes and taking meds incorrectly.  Restless leg-rec stretching before bedtime. May continue roprinerole. Depression: managed by neurology-recent med changes. Pt is unsure what to take. Nocturia: managed by urology, Hx bladder ca. Recent med change-pt thinks she is taking enablex and oxybutynin.   Plan:   Spent 1 hour with pt and her sister, reviewing symptoms, meds, recent specialist visits. Pt needs assistance with meds-have recommended husband prepare pills for week in daily pill case, get clarification from neurology & urology regarding new meds. Advised to stop oxybut & only take enablex until she speaks w/urology. Pt may benefit from RN home visit to assess independence/med teaching. See pt instructions.

## 2012-10-02 NOTE — Patient Instructions (Addendum)
We have talked about many things today.  Please have your husband prepare your meds for you. A daily pill case works well. Please check to see if you are taking lexapro with paxil & call our office to advise. Do not take oxybutinin and enablex. Just take the new medicine-enablex, prescribed by your urologist. Start a magnesium supplement 250 mg daily. If it causes diarrhea, cut back to 150 mg daily. Cut back on sugar as this will help reflux and cholesterol, and weight loss. Please start the exercises classes for parkinsons as maintaining flexibility is important.  Do leg stretches before bedtime to help with restless leg syndrome-at least 10 minutes. Your blood pressure is normal in the office today. Please schedule appt with Dr. Alwyn Ren in next few months. You will be due for physical in Jan. Pleasure to meet you!

## 2012-10-08 NOTE — Telephone Encounter (Signed)
Pt never called back.

## 2012-10-15 ENCOUNTER — Ambulatory Visit: Payer: BC Managed Care – PPO | Admitting: Internal Medicine

## 2012-10-17 ENCOUNTER — Encounter: Payer: Self-pay | Admitting: Internal Medicine

## 2012-10-17 ENCOUNTER — Ambulatory Visit (INDEPENDENT_AMBULATORY_CARE_PROVIDER_SITE_OTHER): Payer: Medicare Other | Admitting: Internal Medicine

## 2012-10-17 ENCOUNTER — Ambulatory Visit: Payer: BC Managed Care – PPO | Admitting: Internal Medicine

## 2012-10-17 VITALS — BP 119/73 | HR 83 | Temp 98.5°F | Resp 12 | Wt 183.6 lb

## 2012-10-17 DIAGNOSIS — I951 Orthostatic hypotension: Secondary | ICD-10-CM

## 2012-10-17 DIAGNOSIS — N6019 Diffuse cystic mastopathy of unspecified breast: Secondary | ICD-10-CM

## 2012-10-17 DIAGNOSIS — R5381 Other malaise: Secondary | ICD-10-CM

## 2012-10-17 DIAGNOSIS — R635 Abnormal weight gain: Secondary | ICD-10-CM

## 2012-10-17 DIAGNOSIS — E538 Deficiency of other specified B group vitamins: Secondary | ICD-10-CM

## 2012-10-17 LAB — CBC WITH DIFFERENTIAL/PLATELET
Basophils Absolute: 0 10*3/uL (ref 0.0–0.1)
Basophils Relative: 0.5 % (ref 0.0–3.0)
Eosinophils Absolute: 0 10*3/uL (ref 0.0–0.7)
Eosinophils Relative: 0.5 % (ref 0.0–5.0)
HCT: 41.5 % (ref 36.0–46.0)
Hemoglobin: 14 g/dL (ref 12.0–15.0)
Lymphocytes Relative: 23.6 % (ref 12.0–46.0)
Lymphs Abs: 1.7 10*3/uL (ref 0.7–4.0)
MCHC: 33.8 g/dL (ref 30.0–36.0)
MCV: 88.9 fl (ref 78.0–100.0)
Monocytes Absolute: 0.3 10*3/uL (ref 0.1–1.0)
Monocytes Relative: 4 % (ref 3.0–12.0)
Neutro Abs: 5 10*3/uL (ref 1.4–7.7)
Neutrophils Relative %: 71.4 % (ref 43.0–77.0)
Platelets: 291 10*3/uL (ref 150.0–400.0)
RBC: 4.67 Mil/uL (ref 3.87–5.11)
RDW: 14.9 % — ABNORMAL HIGH (ref 11.5–14.6)
WBC: 7 10*3/uL (ref 4.5–10.5)

## 2012-10-17 LAB — BASIC METABOLIC PANEL
BUN: 19 mg/dL (ref 6–23)
CO2: 26 mEq/L (ref 19–32)
Calcium: 9.4 mg/dL (ref 8.4–10.5)
Chloride: 103 mEq/L (ref 96–112)
Creatinine, Ser: 0.9 mg/dL (ref 0.4–1.2)
Glucose, Bld: 94 mg/dL (ref 70–99)

## 2012-10-17 LAB — VITAMIN B12: Vitamin B-12: 269 pg/mL (ref 211–911)

## 2012-10-17 NOTE — Patient Instructions (Addendum)
Share results with all non [facility] medical staff seen . Repeat the isometric exercises discussed 4- 5 times prior to standing if you've been seated for a period of time. Wear over the calf support hose if you are standing for prolonged periods of  time or working on your feet for prolonged periods of time.

## 2012-10-17 NOTE — Progress Notes (Signed)
  Subjective:    Patient ID: Kristina Carr, female    DOB: May 20, 1945, 67 y.o.   MRN: [id number]  HPI She has several concerns. Her gynecologist is retiring and she is evaluating ongoing Gyn monitor  including mammograms. Her most recent mammogram was unilateral and bilateral study was recommended last month but not completed .  She describes exhaustion which is typically worse midday. She also has begun to gain weight.  She was documented as having B12 deficiency by her neurologist. She has been receiving parenteral supplements but now is on oral replacement. Her last hematocrit was 30.1 in March of this year.  She's also questioning prophylaxis with flu shot, shingles shot, and pneumonia shots.    Review of Systems She is not having black or tarry stools. She has constipation for which she uses stool softeners as well as Metamucil.  Her mother had colon cancer and her sister had colon polyps . She believes that she has not had colonoscopy for 9 years. She has had blood pressures as low as 60 systolic after standing for periods of time.  Her neurologist apparently has recommended that she see a psychiatrist; she is considering pursuing this     Objective:   Physical Exam General appearance is one of good health and nourishment w/o distress.  Eyes: No conjunctival inflammation or scleral icterus is present.  Thyroid normal to palpation without nodularity or enlargement  Oral exam: Dental hygiene is good; lips and gums are healthy appearing.There is no oropharyngeal erythema or exudate noted.   Heart:  Normal rate and regular rhythm. S1 and S2 normal without gallop, murmur, click, rub or other extra sounds     Lungs:Chest clear to auscultation; no wheezes, rhonchi,rales ,or rubs present.No increased work of breathing.   Abdomen: bowel sounds normal, soft and non-tender without masses, organomegaly or hernias noted.  No guarding or rebound   Skin:Warm & dry.  Intact without  suspicious lesions or rashes ; no jaundice or tenting  Lymphatic: No lymphadenopathy is noted about the head, neck, axilla.   Deep tendon reflexes 1.5-2+ and equal. Mild Parkinson's stigmata. Anxious            Assessment & Plan:   #1Fatigue with weight gain  #2 history of anemia with B12 deficiency  #3 fibrocystic breast disease  #4 possible postural hypotension  Plan: See orders. She is to discuss the immunizations with her Neurologist,especially the Shingles vaccine.  I've also asked her to continue monitor with her gynecologist because of her complex medical history which includes bladder cancer and fibrocystic breast disease.

## 2012-10-29 ENCOUNTER — Other Ambulatory Visit: Payer: Self-pay

## 2012-10-29 ENCOUNTER — Telehealth: Payer: Self-pay | Admitting: Internal Medicine

## 2012-10-29 NOTE — Telephone Encounter (Signed)
Do not see where a referral was placed for a mammogram. Per last OV note pt was advised to continue care for fibrostic breast with GYN please advise if ok to place referral.

## 2012-10-29 NOTE — Telephone Encounter (Signed)
See under Imaging ; ordered 10/20/12

## 2012-10-29 NOTE — Telephone Encounter (Signed)
Please advise on the status of order.

## 2012-10-29 NOTE — Telephone Encounter (Signed)
Patient sister called because Samera Macy was seen on 10/17/2012 and talked about [facility]. Patient has not heard anything yet and wanted to see when it does get scheduled can it be on 10/31/2012 or later in October. Patient wanted to let dr Alwyn Ren know she is going out of town in a few and would really like it too be scheduled soon. thanks

## 2012-11-11 NOTE — Telephone Encounter (Signed)
Patient's sister is calling back about status of this order. She is upset because they have not heard anything about this being scheduled.

## 2012-11-11 NOTE — Telephone Encounter (Signed)
Order for mammogram is incorrect. Please correct and we can get her scheduled.

## 2012-11-12 ENCOUNTER — Other Ambulatory Visit: Payer: Self-pay

## 2012-11-12 ENCOUNTER — Other Ambulatory Visit: Payer: Self-pay | Admitting: Internal Medicine

## 2012-11-12 DIAGNOSIS — Z1231 Encounter for screening mammogram for malignant neoplasm of breast: Secondary | ICD-10-CM

## 2012-11-12 NOTE — Telephone Encounter (Signed)
Pt is already scheduled for mammogram.

## 2012-11-12 NOTE — Telephone Encounter (Signed)
Looks like Dr. Alwyn Ren entered the order this morning, let me know if it is not right.

## 2012-11-14 ENCOUNTER — Telehealth: Payer: Self-pay | Admitting: Internal Medicine

## 2012-11-14 NOTE — Telephone Encounter (Signed)
Note sent to [facility], Clinical coordinator tp clarify ordering questions regarding mammograms.

## 2012-11-14 NOTE — Telephone Encounter (Signed)
Sister called left VM, and I returned call 10?30a today.  Concerned because no appointment from 9/11 or 9/12 visit for scheduling mammogram.   Advised would research and advise Dois Davenport, RN Team lead and follow up.

## 2012-11-25 ENCOUNTER — Other Ambulatory Visit: Payer: Self-pay | Admitting: Internal Medicine

## 2012-11-25 NOTE — Telephone Encounter (Signed)
Protonix refill sent to pharmacy 

## 2012-11-26 ENCOUNTER — Ambulatory Visit: Payer: BC Managed Care – PPO | Admitting: Internal Medicine

## 2012-11-26 DIAGNOSIS — F329 Major depressive disorder, single episode, unspecified: Secondary | ICD-10-CM

## 2012-11-26 DIAGNOSIS — G3184 Mild cognitive impairment, so stated: Secondary | ICD-10-CM

## 2012-11-26 DIAGNOSIS — G20 Parkinson's disease: Secondary | ICD-10-CM

## 2012-11-26 DIAGNOSIS — F411 Generalized anxiety disorder: Secondary | ICD-10-CM

## 2012-12-03 ENCOUNTER — Ambulatory Visit
Admission: RE | Admit: 2012-12-03 | Discharge: 2012-12-03 | Disposition: A | Discharge status: Home / Self Care | Payer: BC Managed Care – PPO | Source: Ambulatory Visit

## 2012-12-03 DIAGNOSIS — Z1231 Encounter for screening mammogram for malignant neoplasm of breast: Secondary | ICD-10-CM

## 2012-12-04 ENCOUNTER — Ambulatory Visit: Payer: BC Managed Care – PPO

## 2012-12-04 DIAGNOSIS — G20A1 Parkinson's disease without dyskinesia, without mention of fluctuations: Secondary | ICD-10-CM

## 2012-12-04 DIAGNOSIS — G20 Parkinson's disease: Secondary | ICD-10-CM

## 2012-12-04 DIAGNOSIS — G3184 Mild cognitive impairment, so stated: Secondary | ICD-10-CM

## 2012-12-04 DIAGNOSIS — F4323 Adjustment disorder with mixed anxiety and depressed mood: Secondary | ICD-10-CM

## 2012-12-12 ENCOUNTER — Other Ambulatory Visit: Payer: Self-pay

## 2013-03-26 DIAGNOSIS — F0390 Unspecified dementia without behavioral disturbance: Secondary | ICD-10-CM | POA: Diagnosis not present

## 2013-03-26 DIAGNOSIS — F919 Conduct disorder, unspecified: Secondary | ICD-10-CM | POA: Diagnosis not present

## 2013-03-26 DIAGNOSIS — G20 Parkinson's disease: Secondary | ICD-10-CM | POA: Diagnosis not present

## 2013-03-26 DIAGNOSIS — R4189 Other symptoms and signs involving cognitive functions and awareness: Secondary | ICD-10-CM | POA: Diagnosis not present

## 2013-04-04 DIAGNOSIS — F919 Conduct disorder, unspecified: Secondary | ICD-10-CM | POA: Diagnosis not present

## 2013-04-10 ENCOUNTER — Telehealth: Payer: Self-pay | Admitting: Internal Medicine

## 2013-04-10 NOTE — Telephone Encounter (Signed)
Patient called stating she never received a copy of her most recent mammogram. She states it ws mentioned that she might need an MRI. She would like to know what she should do.

## 2013-04-15 NOTE — Telephone Encounter (Signed)
Spoke the pt and she had a lot concerns about her health.  She stated she had a normal mammogram but she did not know if she needed to do anything else.  She has had so many health issues so she has a lot of concerns.  Informed the pt that she will need to come in and discuss these issues with Dr. Linna Darner.  Pt agreed and stated that she will call back and schedule an appt.//AB/CMA

## 2013-04-29 ENCOUNTER — Other Ambulatory Visit: Payer: Self-pay

## 2013-05-08 ENCOUNTER — Other Ambulatory Visit: Payer: Self-pay | Admitting: *Deleted

## 2013-05-08 NOTE — Telephone Encounter (Signed)
Error//AB/CMA 

## 2013-05-12 ENCOUNTER — Telehealth: Payer: Self-pay | Admitting: *Deleted

## 2013-05-12 NOTE — Telephone Encounter (Signed)
Spoke with the pt's husband and informed him that we received a fax from the pharmacy requesting Prior Auth on the pt's Pantoprazole 40mg .  He stated that the pt is taking the Pantoprazole but she's only taking 20mg  daily, and that was prescribed by Dr. Tonye Royalty at [facility]  He stated that Dr. Tonye Royalty handles this med for her.  Informed him that he will need to let the pharmacy know that Dr. Tonye Royalty is refilling this for the pt.  He stated that the pt may need to be on the 40mg ,but I informed him that he will need to contact Dr. Tonye Royalty office and let them know that the dose may need to be increased.  He understood and agreed.//AB/CMA

## 2013-05-12 NOTE — Telephone Encounter (Signed)
LMOM (8:40am) asking the pt to RTC regarding Prior Authorization for Pantoprazole.//AB/CMA

## 2013-06-03 ENCOUNTER — Telehealth: Payer: Self-pay

## 2013-06-03 NOTE — Telephone Encounter (Signed)
Patient has been advised

## 2013-06-03 NOTE — Telephone Encounter (Signed)
I called patient to advise her, her insurance is not covering Pantoprazole 40 mg. Per Dr Linna Darner patient is to contact her insurance to see what alternatives are covered under her plan. She will do so and give [place] a call back.   Patient is wondering does anything need to be done next after having her MRI? Please advise.

## 2013-06-03 NOTE — Telephone Encounter (Signed)
   Results are not in the electronic medical record. She me to discuss the findings with the physician who ordered the test. I have no access to the results.

## 2013-06-08 ENCOUNTER — Other Ambulatory Visit: Payer: Self-pay | Admitting: Internal Medicine

## 2013-06-25 DIAGNOSIS — F0390 Unspecified dementia without behavioral disturbance: Secondary | ICD-10-CM | POA: Diagnosis not present

## 2013-06-25 DIAGNOSIS — G20 Parkinson's disease: Secondary | ICD-10-CM | POA: Diagnosis not present

## 2013-06-25 DIAGNOSIS — Z5181 Encounter for therapeutic drug level monitoring: Secondary | ICD-10-CM | POA: Diagnosis not present

## 2013-08-01 DIAGNOSIS — C672 Malignant neoplasm of lateral wall of bladder: Secondary | ICD-10-CM | POA: Diagnosis not present

## 2013-08-01 DIAGNOSIS — N3020 Other chronic cystitis without hematuria: Secondary | ICD-10-CM | POA: Diagnosis not present

## 2013-08-01 DIAGNOSIS — N3946 Mixed incontinence: Secondary | ICD-10-CM | POA: Diagnosis not present

## 2013-09-12 DIAGNOSIS — Z96649 Presence of unspecified artificial hip joint: Secondary | ICD-10-CM | POA: Diagnosis not present

## 2013-09-12 DIAGNOSIS — M48061 Spinal stenosis, lumbar region without neurogenic claudication: Secondary | ICD-10-CM | POA: Diagnosis not present

## 2013-09-12 DIAGNOSIS — Z471 Aftercare following joint replacement surgery: Secondary | ICD-10-CM | POA: Diagnosis not present

## 2013-09-15 DIAGNOSIS — G3183 Dementia with Lewy bodies: Secondary | ICD-10-CM | POA: Diagnosis not present

## 2013-09-15 DIAGNOSIS — F0280 Dementia in other diseases classified elsewhere without behavioral disturbance: Secondary | ICD-10-CM | POA: Diagnosis not present

## 2013-09-15 DIAGNOSIS — G20 Parkinson's disease: Secondary | ICD-10-CM | POA: Diagnosis not present

## 2013-09-15 DIAGNOSIS — F0390 Unspecified dementia without behavioral disturbance: Secondary | ICD-10-CM | POA: Diagnosis not present

## 2013-09-22 DIAGNOSIS — M545 Low back pain, unspecified: Secondary | ICD-10-CM | POA: Diagnosis not present

## 2013-09-22 DIAGNOSIS — M48061 Spinal stenosis, lumbar region without neurogenic claudication: Secondary | ICD-10-CM | POA: Diagnosis not present

## 2013-09-29 DIAGNOSIS — M5137 Other intervertebral disc degeneration, lumbosacral region: Secondary | ICD-10-CM | POA: Diagnosis not present

## 2013-09-29 DIAGNOSIS — M48061 Spinal stenosis, lumbar region without neurogenic claudication: Secondary | ICD-10-CM | POA: Diagnosis not present

## 2013-10-08 DIAGNOSIS — M5137 Other intervertebral disc degeneration, lumbosacral region: Secondary | ICD-10-CM | POA: Diagnosis not present

## 2013-10-08 DIAGNOSIS — M48061 Spinal stenosis, lumbar region without neurogenic claudication: Secondary | ICD-10-CM | POA: Diagnosis not present

## 2013-10-20 DIAGNOSIS — H25019 Cortical age-related cataract, unspecified eye: Secondary | ICD-10-CM | POA: Diagnosis not present

## 2013-10-20 DIAGNOSIS — H35379 Puckering of macula, unspecified eye: Secondary | ICD-10-CM | POA: Diagnosis not present

## 2013-10-21 ENCOUNTER — Encounter: Payer: Self-pay | Admitting: Gastroenterology

## 2013-10-21 DIAGNOSIS — M48061 Spinal stenosis, lumbar region without neurogenic claudication: Secondary | ICD-10-CM | POA: Diagnosis not present

## 2013-10-23 DIAGNOSIS — M48061 Spinal stenosis, lumbar region without neurogenic claudication: Secondary | ICD-10-CM | POA: Diagnosis not present

## 2013-10-28 DIAGNOSIS — M48061 Spinal stenosis, lumbar region without neurogenic claudication: Secondary | ICD-10-CM | POA: Diagnosis not present

## 2013-10-30 DIAGNOSIS — R339 Retention of urine, unspecified: Secondary | ICD-10-CM | POA: Diagnosis not present

## 2013-10-30 DIAGNOSIS — N3946 Mixed incontinence: Secondary | ICD-10-CM | POA: Diagnosis not present

## 2013-10-30 DIAGNOSIS — R3989 Other symptoms and signs involving the genitourinary system: Secondary | ICD-10-CM | POA: Diagnosis not present

## 2013-10-30 DIAGNOSIS — M48061 Spinal stenosis, lumbar region without neurogenic claudication: Secondary | ICD-10-CM | POA: Diagnosis not present

## 2013-11-06 DIAGNOSIS — G4752 REM sleep behavior disorder: Secondary | ICD-10-CM | POA: Diagnosis not present

## 2013-11-06 DIAGNOSIS — M4807 Spinal stenosis, lumbosacral region: Secondary | ICD-10-CM | POA: Diagnosis not present

## 2013-11-10 DIAGNOSIS — M4807 Spinal stenosis, lumbosacral region: Secondary | ICD-10-CM | POA: Diagnosis not present

## 2013-11-12 DIAGNOSIS — R339 Retention of urine, unspecified: Secondary | ICD-10-CM | POA: Diagnosis not present

## 2013-11-12 DIAGNOSIS — N3020 Other chronic cystitis without hematuria: Secondary | ICD-10-CM | POA: Diagnosis not present

## 2013-11-12 DIAGNOSIS — N3946 Mixed incontinence: Secondary | ICD-10-CM | POA: Diagnosis not present

## 2013-11-13 DIAGNOSIS — M4807 Spinal stenosis, lumbosacral region: Secondary | ICD-10-CM | POA: Diagnosis not present

## 2013-11-14 DIAGNOSIS — Z96642 Presence of left artificial hip joint: Secondary | ICD-10-CM | POA: Diagnosis not present

## 2013-11-14 DIAGNOSIS — Z471 Aftercare following joint replacement surgery: Secondary | ICD-10-CM | POA: Diagnosis not present

## 2013-11-14 DIAGNOSIS — M4807 Spinal stenosis, lumbosacral region: Secondary | ICD-10-CM | POA: Diagnosis not present

## 2013-11-14 DIAGNOSIS — M5136 Other intervertebral disc degeneration, lumbar region: Secondary | ICD-10-CM | POA: Diagnosis not present

## 2013-12-08 ENCOUNTER — Encounter: Payer: Self-pay | Admitting: Internal Medicine

## 2014-02-09 DIAGNOSIS — F419 Anxiety disorder, unspecified: Secondary | ICD-10-CM | POA: Diagnosis not present

## 2014-02-09 DIAGNOSIS — F0280 Dementia in other diseases classified elsewhere without behavioral disturbance: Secondary | ICD-10-CM | POA: Diagnosis not present

## 2014-02-09 DIAGNOSIS — G4752 REM sleep behavior disorder: Secondary | ICD-10-CM | POA: Diagnosis not present

## 2014-02-09 DIAGNOSIS — G3183 Dementia with Lewy bodies: Secondary | ICD-10-CM | POA: Diagnosis not present

## 2014-02-09 DIAGNOSIS — R443 Hallucinations, unspecified: Secondary | ICD-10-CM | POA: Diagnosis not present

## 2014-02-09 DIAGNOSIS — F411 Generalized anxiety disorder: Secondary | ICD-10-CM | POA: Diagnosis not present

## 2014-02-23 ENCOUNTER — Ambulatory Visit (INDEPENDENT_AMBULATORY_CARE_PROVIDER_SITE_OTHER): Payer: Medicare Other | Admitting: Family Medicine

## 2014-02-23 VITALS — BP 112/68 | HR 101 | Temp 99.0°F | Resp 18 | Ht 64.0 in | Wt 178.0 lb

## 2014-02-23 DIAGNOSIS — R05 Cough: Secondary | ICD-10-CM | POA: Diagnosis not present

## 2014-02-23 DIAGNOSIS — J329 Chronic sinusitis, unspecified: Secondary | ICD-10-CM | POA: Diagnosis not present

## 2014-02-23 DIAGNOSIS — R059 Cough, unspecified: Secondary | ICD-10-CM

## 2014-02-23 MED ORDER — AMOXICILLIN 875 MG PO TABS: 875.0000 mg | ORAL_TABLET | Freq: Two times a day (BID) | ORAL | Status: DC

## 2014-02-23 NOTE — Progress Notes (Signed)
Subjective: 69 year old lady with a long history of not feeling well apparently. She says that she has parkinsonism and her husband says she has Lewy body disease. She has a hard time giving a history. She had a cold for couple weeks and then developed more cough. Especially last for 5 days. She's been coughing up some purulent phlegm which concerned her husband. With her multitude of health problems who did not want to get abruptly worse.  Allergic to Prilosec, no other known allergies  Objective: Alert but a little confused. Her TMs are normal. Throat clear. Neck supple without significant nodes. Chest is clear to auscultation. No wheezing. Heart regular without murmurs.  Assessment: Cough Probable posterior sinusitis causing postnasal drainage History of parkinsonism History of fluid body disease  Plan: Amoxicillin 875 twice a day OTC guaifenesin Return if worse

## 2014-02-23 NOTE — Patient Instructions (Addendum)
Drink plenty of fluids  Recommend plain Mucinex to help thin secretions (guaifenesin). Ask pharmacist to make certain this is fine with all your other medications.  Amoxicillin one twice daily  Return if worse

## 2014-03-04 ENCOUNTER — Ambulatory Visit (INDEPENDENT_AMBULATORY_CARE_PROVIDER_SITE_OTHER): Payer: Medicare Other | Admitting: Internal Medicine

## 2014-03-04 ENCOUNTER — Encounter: Payer: Self-pay | Admitting: Internal Medicine

## 2014-03-04 ENCOUNTER — Other Ambulatory Visit (INDEPENDENT_AMBULATORY_CARE_PROVIDER_SITE_OTHER): Payer: Medicare Other

## 2014-03-04 ENCOUNTER — Ambulatory Visit (INDEPENDENT_AMBULATORY_CARE_PROVIDER_SITE_OTHER)
Admission: RE | Admit: 2014-03-04 | Discharge: 2014-03-04 | Disposition: A | Discharge status: Home / Self Care | Payer: Medicare Other | Source: Ambulatory Visit | Attending: Internal Medicine | Admitting: Internal Medicine

## 2014-03-04 VITALS — BP 120/72 | HR 106 | Temp 98.7°F | Resp 16 | Ht 64.0 in | Wt 181.2 lb

## 2014-03-04 DIAGNOSIS — J9811 Atelectasis: Secondary | ICD-10-CM | POA: Diagnosis not present

## 2014-03-04 DIAGNOSIS — J209 Acute bronchitis, unspecified: Secondary | ICD-10-CM

## 2014-03-04 DIAGNOSIS — J189 Pneumonia, unspecified organism: Secondary | ICD-10-CM | POA: Diagnosis not present

## 2014-03-04 DIAGNOSIS — R0989 Other specified symptoms and signs involving the circulatory and respiratory systems: Secondary | ICD-10-CM

## 2014-03-04 DIAGNOSIS — R300 Dysuria: Secondary | ICD-10-CM | POA: Diagnosis not present

## 2014-03-04 DIAGNOSIS — R042 Hemoptysis: Secondary | ICD-10-CM | POA: Diagnosis not present

## 2014-03-04 DIAGNOSIS — R0689 Other abnormalities of breathing: Secondary | ICD-10-CM

## 2014-03-04 LAB — URINALYSIS, ROUTINE W REFLEX MICROSCOPIC
BILIRUBIN URINE: NEGATIVE
Hgb urine dipstick: NEGATIVE
NITRITE: NEGATIVE
Specific Gravity, Urine: 1.015 (ref 1.000–1.030)
TOTAL PROTEIN, URINE-UPE24: NEGATIVE
URINE GLUCOSE: NEGATIVE
UROBILINOGEN UA: 0.2 (ref 0.0–1.0)
pH: 6 (ref 5.0–8.0)

## 2014-03-04 MED ORDER — BENZONATATE 200 MG PO CAPS: 200.0000 mg | ORAL_CAPSULE | Freq: Three times a day (TID) | ORAL | Status: DC | PRN

## 2014-03-04 MED ORDER — AMOXICILLIN-POT CLAVULANATE 875-125 MG PO TABS: 1.0000 | ORAL_TABLET | Freq: Two times a day (BID) | ORAL | Status: DC

## 2014-03-04 NOTE — Progress Notes (Signed)
Pre visit review using our clinic review tool, if applicable. No additional management support is needed unless otherwise documented below in the visit note. 

## 2014-03-04 NOTE — Patient Instructions (Signed)
Your next office appointment will be determined based upon review of your pending UA & x-rays. Those instructions will be transmitted to you through My Chart  OR  by mail;whichever process is your choice to receive results & recommendations . Critical values will be called  Followup as needed for your acute issue. Please report any significant change in your symptoms.

## 2014-03-04 NOTE — Progress Notes (Signed)
   Subjective:    Patient ID: Kristina Carr, female    DOB: Jul 20, 1945, 69 y.o.   MRN: [id number]  HPI Symptoms began 3 weeks ago as respiratory tract infection; she was seen in urgent care 1 week ago and prescribed Amoxil 875 mg twice a day. Unfortunately she only took the medicine once a day.  She describes cough productive with thick, white and bloody sputum.  She has frontal headache; nasal obstruction; and fatigue.  She has no other upper respiratory tract or lower respiratory tract infection symptoms.   Review of Systems She is having some dysuria without frequency or nocturia. She has a history of bladder cancer.  She's had some imbalance; actually this is a chronic issue.  She did have nausea  & vomiting last night 1. She also has chronic constipation which is unrelated. She denies any other GI symptoms      Objective:   Physical Exam Positive or pertinent findings include: The most striking findings are mask like facies with minimal expression. There is erythema of the nares. She has rales at the bases greater on the left than the right. These partially clear with deep inspiration  General appearance:Adequately nourished; no acute distress or increased work of breathing is present.  No  lymphadenopathy about the head, neck, or axilla noted.  Eyes: No conjunctival inflammation or lid edema is present. There is no scleral icterus. Ears:  External ear exam shows no significant lesions or deformities.  Otoscopic examination reveals clear canals, tympanic membranes are intact bilaterally without bulging, retraction, inflammation or discharge. Nose:  External nasal examination shows no deformity or inflammation. Nasal mucosa are dfry without lesions or exudates. No septal dislocation or deviation.No obstruction to airflow.  Oral exam: Dental hygiene is good; lips and gums are healthy appearing.There is no oropharyngeal erythema or exudate noted.  Neck:  No deformities,  thyromegaly, masses, or tenderness noted.    Heart:  Slight tachycardia and regular rhythm. S1 and S2 normal without gallop, murmur, click, rub or other extra sounds.  Lungs:No increased WOB. Extremities:  No cyanosis, edema, or clubbing  noted  Skin: Warm & dry w/o jaundice or tenting.       Assessment & Plan:  #1 Acute acute bronchitis with reported hemoptysis.  #2 abnormal breath sounds; rule out  Community acquired pneumonia  #3 dysuria.  Plan see orders and recommendations

## 2014-03-05 ENCOUNTER — Other Ambulatory Visit: Payer: Self-pay | Admitting: Internal Medicine

## 2014-03-05 ENCOUNTER — Telehealth: Payer: Self-pay

## 2014-03-05 ENCOUNTER — Other Ambulatory Visit: Payer: Medicare Other

## 2014-03-05 DIAGNOSIS — R8290 Unspecified abnormal findings in urine: Secondary | ICD-10-CM | POA: Diagnosis not present

## 2014-03-05 DIAGNOSIS — J209 Acute bronchitis, unspecified: Secondary | ICD-10-CM

## 2014-03-05 DIAGNOSIS — J189 Pneumonia, unspecified organism: Secondary | ICD-10-CM

## 2014-03-05 NOTE — Telephone Encounter (Signed)
Patient's sister [name] has been advised and transferred to scheduled to make an appointment for 1 week. Xray order has been placed.

## 2014-03-05 NOTE — Telephone Encounter (Signed)
-----   Message from Hendricks Limes, MD sent at 03/04/2014  6:10 PM EST ----- Urine culture please

## 2014-03-05 NOTE — Telephone Encounter (Signed)
Urine culture request has been sent to lab

## 2014-03-05 NOTE — Telephone Encounter (Signed)
-----   Message from Hendricks Limes, MD sent at 03/04/2014  6:07 PM EST ----- Please verify that results are being checked through My Chart. If  not using My Chart; results will be mailed & My Chart should be closed.

## 2014-03-05 NOTE — Telephone Encounter (Signed)
No wheezing or bronchospasm present; breathing treatments not needed ( I am a Pulmonologist). She does need to blow up @ least 5 balloons per day.CXR & OV in 1 week

## 2014-03-05 NOTE — Telephone Encounter (Signed)
Verified with patient's sister that messages are being checked through my chart. She would like to know if patient needs a breathing treatments? Please advise. Also wanting to know next steps?

## 2014-03-06 ENCOUNTER — Encounter: Payer: Self-pay | Admitting: Internal Medicine

## 2014-03-07 LAB — URINE CULTURE
Colony Count: NO GROWTH
Organism ID, Bacteria: NO GROWTH

## 2014-03-09 ENCOUNTER — Other Ambulatory Visit: Payer: Self-pay

## 2014-03-11 ENCOUNTER — Encounter: Payer: Self-pay | Admitting: Internal Medicine

## 2014-03-11 ENCOUNTER — Ambulatory Visit (INDEPENDENT_AMBULATORY_CARE_PROVIDER_SITE_OTHER): Payer: Medicare Other | Admitting: Internal Medicine

## 2014-03-11 ENCOUNTER — Ambulatory Visit (INDEPENDENT_AMBULATORY_CARE_PROVIDER_SITE_OTHER)
Admission: RE | Admit: 2014-03-11 | Discharge: 2014-03-11 | Disposition: A | Discharge status: Home / Self Care | Payer: Medicare Other | Source: Ambulatory Visit | Attending: Internal Medicine | Admitting: Internal Medicine

## 2014-03-11 VITALS — BP 120/84 | HR 89 | Temp 98.5°F | Ht 64.0 in | Wt 177.5 lb

## 2014-03-11 DIAGNOSIS — Z23 Encounter for immunization: Secondary | ICD-10-CM

## 2014-03-11 DIAGNOSIS — J189 Pneumonia, unspecified organism: Secondary | ICD-10-CM

## 2014-03-11 DIAGNOSIS — J209 Acute bronchitis, unspecified: Secondary | ICD-10-CM

## 2014-03-11 MED ORDER — AMOXICILLIN-POT CLAVULANATE 875-125 MG PO TABS: 1.0000 | ORAL_TABLET | Freq: Two times a day (BID) | ORAL | Status: DC

## 2014-03-11 NOTE — Patient Instructions (Addendum)
Please  blowup at least 10  balloons a day to enhance inflation of the lungs and prevent atelectasis as we discussed. Fill the  prescription for antibiotic if notified by My Chart

## 2014-03-11 NOTE — Progress Notes (Signed)
   Subjective:    Patient ID: Kristina Carr, female    DOB: 1946-01-11, 69 y.o.   MRN: [id number]  HPI  She is here for follow-up of the pneumonia in the right middle lobe. The hemoptysis has resolved. She questions whether the modified incentive spirometry employing balloons was of benefit.  All secretions are clear/white  & thin at this time . She does have some coughing especially when talking. Tessalon Perles were of some benefit.  She has no upper respiratory tract infection symptoms.  Chest x-rays were reviewed. There is been dramatic improvement in the right middle lobe pneumonia. There is some residual linear atelectasis .  Review of Systems Frontal headache, facial pain , nasal purulence, dental pain, sore throat , otic pain or otic discharge denied. No fever , chills or sweats. No dyspnea except with stairs ; no wheezing     Objective:   Physical Exam General appearance: Subtle Parkinsonian facies noted.Adequately nourished; no acute distress or increased work of breathing is present.  No  lymphadenopathy about the head, neck, or axilla noted.   Eyes: No conjunctival inflammation or lid edema is present. There is no scleral icterus.  Ears:  External ear exam shows no significant lesions or deformities.  Otoscopic examination reveals clear canals, tympanic membranes are intact bilaterally without bulging, retraction, inflammation or discharge.  Nose:  External nasal examination shows no deformity or inflammation. Nasal mucosa are pink and moist without lesions or exudates. No septal dislocation or deviation.No obstruction to airflow.   Oral exam: Dental hygiene is good; lips and gums are healthy appearing.There is no oropharyngeal erythema or exudate noted.   Neck:  No deformities, thyromegaly, masses, or tenderness noted.   Supple with full range of motion without pain.   Heart:  Normal rate and regular rhythm. S1 and S2 normal without gallop, murmur, click, rub or  other extra sounds.   Lungs:Chest clear to auscultation; no wheezes, rhonchi,rales ,or rubs present.  Extremities:  No cyanosis, edema, or clubbing  noted    Skin: Warm & dry w/o jaundice or tenting.        Assessment & Plan:  #1 Community acquired pneumonia, right middle lobe with associated atelectasis.  Radiographically there's been dramatic improvement.  Plan: Pneumonia immunization status will be verified. Additional interventional prescribed only if the radiologist feels there is any residual pneumonia.

## 2014-03-11 NOTE — Progress Notes (Signed)
Pre visit review using our clinic review tool, if applicable. No additional management support is needed unless otherwise documented below in the visit note. 

## 2014-03-12 ENCOUNTER — Other Ambulatory Visit: Payer: Self-pay | Admitting: Internal Medicine

## 2014-03-12 ENCOUNTER — Encounter: Payer: Self-pay | Admitting: Internal Medicine

## 2014-03-12 DIAGNOSIS — N6011 Diffuse cystic mastopathy of right breast: Secondary | ICD-10-CM

## 2014-03-12 DIAGNOSIS — N6012 Diffuse cystic mastopathy of left breast: Principal | ICD-10-CM

## 2014-03-13 DIAGNOSIS — N3020 Other chronic cystitis without hematuria: Secondary | ICD-10-CM | POA: Diagnosis not present

## 2014-03-13 DIAGNOSIS — N3946 Mixed incontinence: Secondary | ICD-10-CM | POA: Diagnosis not present

## 2014-03-13 DIAGNOSIS — Z8551 Personal history of malignant neoplasm of bladder: Secondary | ICD-10-CM | POA: Diagnosis not present

## 2014-03-16 ENCOUNTER — Encounter: Payer: Self-pay | Admitting: Internal Medicine

## 2014-03-17 DIAGNOSIS — R300 Dysuria: Secondary | ICD-10-CM | POA: Diagnosis not present

## 2014-03-17 DIAGNOSIS — N3020 Other chronic cystitis without hematuria: Secondary | ICD-10-CM | POA: Diagnosis not present

## 2014-03-17 DIAGNOSIS — R310 Gross hematuria: Secondary | ICD-10-CM | POA: Diagnosis not present

## 2014-04-02 ENCOUNTER — Other Ambulatory Visit: Payer: Self-pay | Admitting: Internal Medicine

## 2014-04-02 DIAGNOSIS — N6011 Diffuse cystic mastopathy of right breast: Secondary | ICD-10-CM

## 2014-04-02 DIAGNOSIS — N6012 Diffuse cystic mastopathy of left breast: Secondary | ICD-10-CM

## 2014-04-03 DIAGNOSIS — R310 Gross hematuria: Secondary | ICD-10-CM | POA: Diagnosis not present

## 2014-04-07 ENCOUNTER — Telehealth: Payer: Self-pay | Admitting: Internal Medicine

## 2014-04-07 DIAGNOSIS — N644 Mastodynia: Secondary | ICD-10-CM

## 2014-04-07 NOTE — Telephone Encounter (Signed)
Per phone call from the [facility], they need diagnosis for mammogram and ultrasound changed to "bilateral breast pain." They also need another ultrasound entered for left breast because the patient's sister states the patient is having pain in both breasts. Thank you!

## 2014-04-16 ENCOUNTER — Ambulatory Visit
Admission: RE | Admit: 2014-04-16 | Discharge: 2014-04-16 | Disposition: A | Discharge status: Home / Self Care | Payer: Medicare Other | Source: Ambulatory Visit | Attending: Internal Medicine | Admitting: Internal Medicine

## 2014-04-16 DIAGNOSIS — N644 Mastodynia: Secondary | ICD-10-CM

## 2014-04-22 DIAGNOSIS — N3020 Other chronic cystitis without hematuria: Secondary | ICD-10-CM | POA: Diagnosis not present

## 2014-04-22 DIAGNOSIS — N3946 Mixed incontinence: Secondary | ICD-10-CM | POA: Diagnosis not present

## 2014-04-22 DIAGNOSIS — C672 Malignant neoplasm of lateral wall of bladder: Secondary | ICD-10-CM | POA: Diagnosis not present

## 2014-05-11 DIAGNOSIS — Z9221 Personal history of antineoplastic chemotherapy: Secondary | ICD-10-CM | POA: Diagnosis not present

## 2014-05-11 DIAGNOSIS — Z8639 Personal history of other endocrine, nutritional and metabolic disease: Secondary | ICD-10-CM | POA: Diagnosis not present

## 2014-05-11 DIAGNOSIS — G3183 Dementia with Lewy bodies: Secondary | ICD-10-CM | POA: Diagnosis not present

## 2014-05-11 DIAGNOSIS — K219 Gastro-esophageal reflux disease without esophagitis: Secondary | ICD-10-CM | POA: Diagnosis not present

## 2014-05-11 DIAGNOSIS — G4752 REM sleep behavior disorder: Secondary | ICD-10-CM | POA: Diagnosis not present

## 2014-05-11 DIAGNOSIS — F0280 Dementia in other diseases classified elsewhere without behavioral disturbance: Secondary | ICD-10-CM | POA: Diagnosis not present

## 2014-05-11 DIAGNOSIS — Z8551 Personal history of malignant neoplasm of bladder: Secondary | ICD-10-CM | POA: Diagnosis not present

## 2014-05-18 ENCOUNTER — Encounter: Payer: Self-pay | Admitting: Gastroenterology

## 2014-06-24 DIAGNOSIS — F0280 Dementia in other diseases classified elsewhere without behavioral disturbance: Secondary | ICD-10-CM | POA: Diagnosis not present

## 2014-06-24 DIAGNOSIS — G3183 Dementia with Lewy bodies: Secondary | ICD-10-CM | POA: Diagnosis not present

## 2014-06-24 DIAGNOSIS — K219 Gastro-esophageal reflux disease without esophagitis: Secondary | ICD-10-CM | POA: Diagnosis not present

## 2014-07-27 DIAGNOSIS — F0280 Dementia in other diseases classified elsewhere without behavioral disturbance: Secondary | ICD-10-CM | POA: Diagnosis not present

## 2014-07-27 DIAGNOSIS — G3183 Dementia with Lewy bodies: Secondary | ICD-10-CM | POA: Diagnosis not present

## 2014-07-27 DIAGNOSIS — R498 Other voice and resonance disorders: Secondary | ICD-10-CM | POA: Diagnosis not present

## 2014-10-30 DIAGNOSIS — N3946 Mixed incontinence: Secondary | ICD-10-CM | POA: Diagnosis not present

## 2014-10-30 DIAGNOSIS — Z8551 Personal history of malignant neoplasm of bladder: Secondary | ICD-10-CM | POA: Diagnosis not present

## 2014-11-16 ENCOUNTER — Ambulatory Visit: Payer: Medicare Other | Admitting: Internal Medicine

## 2014-11-23 ENCOUNTER — Encounter: Payer: Self-pay | Admitting: Internal Medicine

## 2014-11-23 ENCOUNTER — Ambulatory Visit (INDEPENDENT_AMBULATORY_CARE_PROVIDER_SITE_OTHER): Payer: Medicare Other | Admitting: Internal Medicine

## 2014-11-23 VITALS — BP 100/66 | HR 78 | Temp 97.7°F | Resp 20 | Ht 64.0 in | Wt 165.2 lb

## 2014-11-23 DIAGNOSIS — C679 Malignant neoplasm of bladder, unspecified: Secondary | ICD-10-CM

## 2014-11-23 DIAGNOSIS — G8929 Other chronic pain: Secondary | ICD-10-CM

## 2014-11-23 DIAGNOSIS — K219 Gastro-esophageal reflux disease without esophagitis: Secondary | ICD-10-CM | POA: Diagnosis not present

## 2014-11-23 DIAGNOSIS — M545 Low back pain, unspecified: Secondary | ICD-10-CM

## 2014-11-23 DIAGNOSIS — F0280 Dementia in other diseases classified elsewhere without behavioral disturbance: Secondary | ICD-10-CM | POA: Diagnosis not present

## 2014-11-23 DIAGNOSIS — Z23 Encounter for immunization: Secondary | ICD-10-CM

## 2014-11-23 DIAGNOSIS — G3183 Dementia with Lewy bodies: Secondary | ICD-10-CM

## 2014-11-23 DIAGNOSIS — G20A1 Parkinson's disease without dyskinesia, without mention of fluctuations: Secondary | ICD-10-CM

## 2014-11-23 DIAGNOSIS — Z1211 Encounter for screening for malignant neoplasm of colon: Secondary | ICD-10-CM | POA: Diagnosis not present

## 2014-11-23 DIAGNOSIS — Z780 Asymptomatic menopausal state: Secondary | ICD-10-CM | POA: Diagnosis not present

## 2014-11-23 DIAGNOSIS — G20 Parkinson's disease: Secondary | ICD-10-CM | POA: Diagnosis not present

## 2014-11-23 NOTE — Progress Notes (Signed)
Patient ID: Kristina Carr, female   DOB: August 13, 1945, 69 y.o.   MRN: [id number]   Location: [place] Provider: Rexene Edison. Mariea Clonts, D.O., C.M.D.  Code Status: DNR Goals of Care: Advanced Directive information Does patient have an advance directive?: Yes  Chief Complaint  Patient presents with  . Establish Care    New Patient Establish Care  . Medical Management of Chronic Issues    For Parkinson's disease  . Immunizations    Would like to have flu shot and Pneum 13    HPI: Patient is a 69 y.o. female seen in the office today to establish with [facility], for management of her chronic diseases and for immunizations that are needed.  Lives in the new segment of [place].    Parkinson's disease x 5 years known diagnosis.  4-5 years before that things suggested Parkinson's.  Has now had diagnosis of Lewy body for about a year.    Had hip replacement surgery on left.  Due to spinal surgeries, she has to have anesthesia and it took a long time to get better afterwards.  Has memory loss.  Has had difficulty using her cell phone.    Has a lot of gastric problems.    Will get severe exhaustion, stomach upset daily.    Carbidopa was increased 4 mos ago--very obvious about 1.5 hrs before the next dose.  Then about the same amount of time afterwards, the pills kick in and then gets dyskinesias.  Gradually increasing the sinemet to avoid wear-off.   Going to [facility], Dr. Tonye Royalty and Clyda Hurdle.   Bladder cancer:  Went to Dr. Risa Grill about this.  Toast and egg test ???.  Had CT of her bladder that showed the cancer. Was removed within a week.  This was 4 years ago per her husband.  He does cystoscopy to reassess.  Is now on myrbetriq now for her bladder.  Better than with no medicine.  Always goes to restroom wherever she is.   Takes 5 pills at 7am, 2 sinemet at 12, 2 at 5, then 7 pills at 9:30pm.  Has been taking the myrbetriq at 9.  Does wear depends.  Her husband feels  like maybe it's been helpful.    Has a lot of back problems--cannot walk far or stand without pain.    Requip at bedtime was reduced due to adverse impact on PD.    Hallucinations are still occuring sometimes.  Takes quetiapine 50mg  at bedtime and aricept 5mg  for memory.    Is frustrated with her mind being good one moment and bad another.  Had scary people in the bedroom.  Had a person living in her home since she's been at home.  Followed her from one home to the next.  Is an animal lover especially with horses--often had animals on the floor.  Did not realize her husband was who he was and babies were in the house--that's what frightened her husband.  Says people are wrapped up in the home like mummies.    She is having difficulty walking around the house b/c of the new enrviroment.  She is dealing with the hallucinations.    Colon cancer screening:  Mother had colon cancer when she died.    Has had breast biopsies.  All have been benign.    She no longer drives.  She's been very active and is till bad at her husband from 2 years ago.  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    Her husband is frustrated.    [facility] and would have to sleep all day.  They are considering going back to the pool program at [place].    Review of Systems:  Review of Systems  Constitutional: Positive for weight loss, malaise/fatigue and diaphoresis. Negative for fever and chills.  HENT: Negative for congestion and hearing loss.   Eyes: Negative for blurred vision.  Respiratory: Negative for cough and shortness of breath.   Cardiovascular: Negative for chest pain.  Gastrointestinal: Positive for heartburn, nausea, abdominal pain and diarrhea. Negative for constipation, blood in stool and melena.       Dysphagia, flatulence  Genitourinary: Positive for urgency and frequency.       Incontinence  Musculoskeletal: Positive for myalgias and back pain.  Negative for falls.  Skin: Negative for rash.  Neurological: Positive for dizziness, tremors and weakness.       Dyskinesias, shuffling gait  Psychiatric/Behavioral: Positive for depression, hallucinations and memory loss. The patient does not have insomnia.     Past Medical History  Diagnosis Date  . Hiatal hernia   . Esophageal reflux   . Personal history of other disorders of nervous system and sense organs   . Other and unspecified hyperlipidemia   . Abdominal pain, epigastric   . Flatulence, eructation, and gas pain   . Irritable bowel syndrome   . Pain in joint, site unspecified   . Other abnormal glucose   . Abnormality of gait   . Cervicalgia   . Lumbago   . Nonspecific elevation of levels of transaminase or lactic acid dehydrogenase (LDH)   . Other malaise and fatigue   . Anxiety state, unspecified   . Palpitations   . Other chest pain   . Restless leg syndrome   . Other and unspecified hyperlipidemia   . Parkinson's disease     Dr Tonye Royalty, [facility]  . Detrusor instability   . Bladder cancer ([place]) 2011    DR. GRAPEY  . PONV (postoperative nausea and vomiting)   . History of recurrent UTIs   . Arthritis   . Lewy body dementia     Past Surgical History  Procedure Laterality Date  . Appendectomy    . Lumbar laminectomy      X 3; last @ age34  . Bunionectomy      left  . Trigger finger release      X2  . Bladder tumor excision    . Cesarean section    . Chemotherapy      FLUSH-CANCER EXCISED CYSTOSCOPICALLY FOLLOWED BY INSTALLATION OF CHEMOTHERAPY.  . Breast surgery      Biopsy-benign  . Colonoscopy  2011    neg; Dr Sharlett Iles  . Total hip arthroplasty Left 04/12/2012    Procedure: TOTAL HIP ARTHROPLASTY ANTERIOR APPROACH;  Surgeon: Gearlean Alf, MD;  Location: [place];  Service: Orthopedics;  Laterality: Left;  . Upper gi endoscopy      hiatal hernia    Allergies  Allergen Reactions  . Omeprazole     REACTION: HIVES Because of a history of documented  adverse serious drug reaction;Medi Alert bracelet  is recommended  . Simvastatin     REACTION: LEG CRAMPS  . Venlafaxine     REACTION: nausea   Social History   Social History  . Marital Status: Married    Spouse Name: N/A  . Number of Children: 1  . Years  of Education: N/A   Occupational History  .      horse power   Social History Main Topics  . Smoking status: Never Smoker   . Smokeless tobacco: Never Used     Comment: social  . Alcohol Use: Yes     Comment: rarely  . Drug Use: No  . Sexual Activity: Yes    Birth Control/ Protection: Post-menopausal   Other Topics Concern  . Not on file   Social History Narrative   Lives at [facility]    Married -[name]   Never smoked   Alcohol none   Exercise none   Family History  Problem Relation Age of Onset  . Lung cancer Father   . Colon cancer Mother   . Hypertension Mother   . Breast cancer Mother 91  . Stomach cancer Maternal Grandmother   . Osteoporosis Maternal Grandmother   . Breast cancer Maternal Aunt 30  . Prostate cancer Paternal Grandfather   . Prostate cancer      Paternal Kandis Fantasia  . Prostate cancer Maternal Grandfather   . Diabetes Neg Hx   . Stroke Neg Hx   . Heart attack Neg Hx   . Colon polyps Sister       Medication List       This list is accurate as of: 11/23/14 10:27 AM.  Always use your most recent med list.               acetaminophen-codeine 300-30 MG tablet  Commonly known as:  TYLENOL #3  Take 1 tablet by mouth every 8 (eight) hours as needed for moderate pain.     amoxicillin-clavulanate 875-125 MG tablet  Commonly known as:  AUGMENTIN  Take 1 tablet by mouth 2 (two) times daily.     carbidopa-levodopa 25-100 MG tablet  Commonly known as:  SINEMET IR  Take 2 tablets by mouth 4 (four) times daily.     clonazePAM 0.5 MG tablet  Commonly known as:  KLONOPIN  Take 0.5 mg by mouth every evening.     donepezil 5 MG tablet  Commonly known as:  ARICEPT  Take 5  mg by mouth at bedtime.     MYRBETRIQ 25 MG Tb24 tablet  Generic drug:  mirabegron ER  Take 25 mg by mouth daily.     pantoprazole 20 MG tablet  Commonly known as:  PROTONIX  Take 20 mg by mouth daily.     PARoxetine 20 MG tablet  Commonly known as:  PAXIL  Take 40 mg by mouth every morning.     QUEtiapine 50 MG tablet  Commonly known as:  SEROQUEL  Take 50 mg by mouth every evening.     rOPINIRole 8 MG 24 hr tablet  Commonly known as:  REQUIP XL  Take 8 mg by mouth at bedtime.        Health Maintenance  Topic Date Due  . Hepatitis C Screening  [birth year]  . INFLUENZA VACCINE  09/07/2015  . PNA vac Low Risk Adult (2 of 2 - PPSV23) 11/23/2015  . MAMMOGRAM  04/15/2016  . TETANUS/TDAP  07/21/2018  . COLONOSCOPY  03/21/2020  . DEXA SCAN  Completed  . ZOSTAVAX  Completed    Physical Exam: Filed Vitals:   11/23/14 0854  BP: 100/66  Pulse: 78  Temp: 97.7 F (36.5 C)  TempSrc: Oral  Resp: 20  Height: 5\' 4"  (1.626 m)  Weight: 165 lb 3.2 oz (74.934 kg)  SpO2: 96%   Body  mass index is 28.34 kg/(m^2). Physical Exam  Constitutional: She appears well-developed and well-nourished. No distress.  HENT:  Head: Normocephalic and atraumatic.  Right Ear: External ear normal.  Left Ear: External ear normal.  Nose: Nose normal.  Mouth/Throat: Oropharynx is clear and moist.  Eyes: EOM are normal. Pupils are equal, round, and reactive to light.  Neck: Normal range of motion. Neck supple. No JVD present.  Cardiovascular: Normal rate, regular rhythm, normal heart sounds and intact distal pulses.   Pulmonary/Chest: Effort normal and breath sounds normal. No respiratory distress.  Abdominal: Soft. Bowel sounds are normal. She exhibits no distension and no mass. There is no tenderness.  Musculoskeletal: She exhibits no edema or tenderness.  Lymphadenopathy:    She has no cervical adenopathy.  Neurological: She is alert. She exhibits abnormal muscle tone.  Oriented to person and  place  Skin: Skin is warm and dry.  Psychiatric:  Difficulty staying on subject, periods of complete clarity and precision and others of confusion and totally erratic thoughts/delusions, paranoia    Labs reviewed: Basic Metabolic Panel: No results for input(s): NA, K, CL, CO2, GLUCOSE, BUN, CREATININE, CALCIUM, MG, PHOS, TSH in the last 8760 hours. Liver Function Tests: No results for input(s): AST, ALT, ALKPHOS, BILITOT, PROT, ALBUMIN in the last 8760 hours. No results for input(s): LIPASE, AMYLASE in the last 8760 hours. No results for input(s): AMMONIA in the last 8760 hours. CBC: No results for input(s): WBC, NEUTROABS, HGB, HCT, MCV, PLT in the last 8760 hours. Lipid Panel: No results for input(s): CHOL, HDL, LDLCALC, TRIG, CHOLHDL, LDLDIRECT in the last 8760 hours. Lab Results  Component Value Date   HGBA1C 6.0 02/26/2012     Assessment/Plan 1. PARKINSON'S DISEASE - stable recently -cont on requip 8mg  at hs and sinemet IR 25/100mg  2 tabs qid -keep neurology f/u with Dr. Tonye Royalty but it appears he's a psychiatrist  2. Malignant neoplasm of urinary bladder, unspecified site ([place]) -cont myrbetriq and monitoring thru [facility], Dr. Risa Grill; had not progressed through bladder wall per pt  3. Lewy body dementia -followed at [facility] for this--she has paranoia, visual hallucinations of animals and people wrapped up like mummies all throughout the house which she's been trying to teach herself to ignore -cont on seroquel, klonopin, and paxil as well as aricept for this -avoid other antipsychotics which worsen the condition  4. Chronic midline low back pain without sciatica -cont prn tylenol for this, would avoid tylenol with codeine if possible b/c it can worsen psychosis  5. Gastroesophageal reflux disease, esophagitis presence not specified -saw Dr. Wynetta Emery from GI in the past--name does not come up in care team  6. Postmenopausal estrogen deficiency -discussed use of ca  with D and additional D supplement - DG Bone Density; Future  7. Need for prophylactic vaccination and inoculation against influenza - Flu Vaccine QUAD 36+ mos PF IM (Fluarix & Fluzone Quad PF)  8. Need for vaccination with 13-polyvalent pneumococcal conjugate vaccine - Pneumococcal conjugate vaccine 13-valent IM  9.  Screening for colon cancer -due to intolerance of anesthesia b/c of her lewy body dementia, cscope is not an option for her despite her fh/o colon ca -will set her up for cologuard instead  Labs/tests ordered:  Bone density, cologuard, flu and prevnar, cbc, cmp, flp, b12/folate and vitamin D labs Orders Placed This Encounter  Procedures  . DG Bone Density    Standing Status: Future     Number of Occurrences:      Standing  Expiration Date: 01/23/2016    Order Specific Question:  Reason for Exam (SYMPTOM  OR DIAGNOSIS REQUIRED)    Answer:  postmenopausal estrogen deficiency    Order Specific Question:  Preferred imaging location?    Answer:  [facility]  . Flu Vaccine QUAD 36+ mos PF IM (Fluarix & Fluzone Quad PF)  . Pneumococcal conjugate vaccine 13-valent IM    Next appt: annual exam 04/08/15    Buckley Bradly L. Maddison Kilner, D.O. [facility] [address], [place] [address] Cell Phone (Mon-Fri 8am-5pm):  [phone] On Call:  [phone] & follow prompts after 5pm & weekends Office Phone:  [phone] Office Fax:  [phone]

## 2014-12-01 ENCOUNTER — Telehealth: Payer: Self-pay | Admitting: *Deleted

## 2014-12-01 NOTE — Telephone Encounter (Signed)
I believe it would be ok to give her a fleets enema to do the cologuard.  I don't think these things affect results.

## 2014-12-01 NOTE — Telephone Encounter (Signed)
Patient husband notified and agreed.  

## 2014-12-01 NOTE — Telephone Encounter (Signed)
Patient husband called and stated that patient is doing a Cologuard and unable to past stool. Could he give her a fleet enema to get the test? Last bowel movement was today but not enough to collect. Please Advise.

## 2014-12-08 DIAGNOSIS — Z1212 Encounter for screening for malignant neoplasm of rectum: Secondary | ICD-10-CM | POA: Diagnosis not present

## 2014-12-08 DIAGNOSIS — Z1211 Encounter for screening for malignant neoplasm of colon: Secondary | ICD-10-CM | POA: Diagnosis not present

## 2014-12-08 LAB — COLOGUARD: Cologuard: NEGATIVE

## 2014-12-09 ENCOUNTER — Other Ambulatory Visit: Payer: Self-pay | Admitting: Internal Medicine

## 2014-12-16 ENCOUNTER — Non-Acute Institutional Stay: Payer: Medicare Other | Admitting: Internal Medicine

## 2014-12-16 ENCOUNTER — Telehealth: Payer: Self-pay

## 2014-12-16 ENCOUNTER — Encounter: Payer: Self-pay | Admitting: Internal Medicine

## 2014-12-16 VITALS — BP 110/72 | HR 72 | Temp 97.7°F | Wt 161.0 lb

## 2014-12-16 DIAGNOSIS — K5900 Constipation, unspecified: Secondary | ICD-10-CM | POA: Diagnosis not present

## 2014-12-16 DIAGNOSIS — G3183 Dementia with Lewy bodies: Secondary | ICD-10-CM

## 2014-12-16 DIAGNOSIS — K592 Neurogenic bowel, not elsewhere classified: Secondary | ICD-10-CM

## 2014-12-16 DIAGNOSIS — G20 Parkinson's disease: Secondary | ICD-10-CM

## 2014-12-16 DIAGNOSIS — G20A1 Parkinson's disease without dyskinesia, without mention of fluctuations: Secondary | ICD-10-CM

## 2014-12-16 DIAGNOSIS — F0280 Dementia in other diseases classified elsewhere without behavioral disturbance: Secondary | ICD-10-CM

## 2014-12-16 LAB — FECAL OCCULT BLOOD, GUAIAC: Fecal Occult Blood: NEGATIVE

## 2014-12-16 MED ORDER — DOCUSATE SODIUM 100 MG PO CAPS: 200.0000 mg | ORAL_CAPSULE | Freq: Two times a day (BID) | ORAL | Status: DC

## 2014-12-16 NOTE — Progress Notes (Signed)
Location:  [facility]  Code Status:  Goals of Care: Advanced Directive information Does patient have an advance directive?: Yes, Type of Advance Directive: Healthcare Power of Attorney   Chief Complaint  Patient presents with  . Abdominal Pain    with constipation and some nausea.  Here with husband    HPI: Patient is a 69 y.o. female seen in the office today for complaints of constipation.   Notes constipation for the past few weeks. She cannot recall her last normal BM. Has had small hard BM's. Yesterday had a BM that she describes as medium.  She has tried taking a probiotic, and a stool softener one pill, once per day. Her husband was not aware that she was taking the stool softener. Has had some nausea, vomited three days ago that she recalls, spouse does not recall her vomiting. Denies abdominal pain, notes some discomfort. She is passing gas. She is eating less than usual and not drinking as many fluids due to incontinence.   Review of Systems:  Review of Systems  Constitutional: Negative for fever, chills, weight loss and malaise/fatigue.  Gastrointestinal: Positive for nausea, vomiting and constipation. Negative for abdominal pain, diarrhea, blood in stool and melena.  Genitourinary: Negative for dysuria, urgency and frequency.  Neurological: Positive for tremors. Negative for weakness.  Psychiatric/Behavioral: Positive for memory loss.    Past Medical History  Diagnosis Date  . Hiatal hernia   . Esophageal reflux   . Personal history of other disorders of nervous system and sense organs   . Other and unspecified hyperlipidemia   . Abdominal pain, epigastric   . Flatulence, eructation, and gas pain   . Irritable bowel syndrome   . Pain in joint, site unspecified   . Other abnormal glucose   . Abnormality of gait   . Cervicalgia   . Lumbago   . Nonspecific elevation of levels of transaminase or lactic acid dehydrogenase  (LDH)   . Other malaise and fatigue   . Anxiety state, unspecified   . Palpitations   . Other chest pain   . Restless leg syndrome   . Other and unspecified hyperlipidemia   . Parkinson's disease     Dr Tonye Royalty, [facility]  . Detrusor instability   . Bladder cancer ([place]) 2011    DR. GRAPEY  . PONV (postoperative nausea and vomiting)   . History of recurrent UTIs   . Arthritis   . Lewy body dementia     Past Surgical History  Procedure Laterality Date  . Appendectomy    . Lumbar laminectomy      X 3; last @ age34  . Bunionectomy      left  . Trigger finger release      X2  . Bladder tumor excision    . Cesarean section    . Chemotherapy      FLUSH-CANCER EXCISED CYSTOSCOPICALLY FOLLOWED BY INSTALLATION OF CHEMOTHERAPY.  . Breast surgery      Biopsy-benign  . Colonoscopy  2011    neg; Dr Sharlett Iles  . Total hip arthroplasty Left 04/12/2012    Procedure: TOTAL HIP ARTHROPLASTY ANTERIOR APPROACH;  Surgeon: Gearlean Alf, MD;  Location: [place];  Service: Orthopedics;  Laterality: Left;  . Upper gi endoscopy      hiatal hernia    Allergies  Allergen Reactions  . Omeprazole     REACTION: HIVES Because of a history of documented adverse serious drug reaction;Medi Alert bracelet  is recommended  . Simvastatin     REACTION: LEG CRAMPS  . Venlafaxine     REACTION: nausea   Medications: Patient's Medications  New Prescriptions   No medications on file  Previous Medications   ACETAMINOPHEN-CODEINE (TYLENOL #3) 300-30 MG TABLET    Take 1 tablet by mouth every 8 (eight) hours as needed for moderate pain.   AMOXICILLIN-CLAVULANATE (AUGMENTIN) 875-125 MG PER TABLET    Take 1 tablet by mouth 2 (two) times daily.   CARBIDOPA-LEVODOPA (SINEMET IR) 25-100 MG PER TABLET    Take 2 tablets by mouth 4 (four) times daily.    CLONAZEPAM (KLONOPIN) 0.5 MG TABLET    Take 0.5 mg by mouth every evening.   DONEPEZIL (ARICEPT) 5 MG TABLET    Take 5 mg by mouth at bedtime.   MIRABEGRON ER (MYRBETRIQ)  25 MG TB24 TABLET    Take 25 mg by mouth daily.   PANTOPRAZOLE (PROTONIX) 20 MG TABLET    TAKE 1 TABLET BY MOUTH EVERY DAY   PAROXETINE (PAXIL) 20 MG TABLET    Take 40 mg by mouth every morning.    QUETIAPINE (SEROQUEL) 50 MG TABLET    Take 50 mg by mouth every evening.   ROPINIROLE (REQUIP XL) 8 MG 24 HR TABLET    Take 8 mg by mouth at bedtime.  Modified Medications   No medications on file  Discontinued Medications   No medications on file    Physical Exam: Filed Vitals:   12/16/14 1035  BP: 110/72  Pulse: 72  Temp: 97.7 F (36.5 C)  TempSrc: Oral  Weight: 161 lb (73.029 kg)  SpO2: 99%   Physical Exam  Constitutional: She appears well-developed and well-nourished. No distress.  Cardiovascular: Normal rate, regular rhythm, normal heart sounds and intact distal pulses.   Pulmonary/Chest: Effort normal and breath sounds normal.  Abdominal: Soft. She exhibits no distension and no mass. There is no tenderness. There is no rebound and no guarding.  Bowel sounds hypoactive in RUQ, otherwise normal; slight discomfort with palpation in LLQ; percussion reveals resonance in all quadrants  Musculoskeletal:  Stooped posture with gait, difficulty initiating gait  Neurological: She is alert.  Skin: Skin is warm and dry. She is not diaphoretic. No erythema. No pallor.  Psychiatric:  Recall appeared normal, however spouse disagreed with some of patien'ts account indicating poor recall    Labs reviewed:  Lab Results  Component Value Date   HGBA1C 6.0 02/26/2012     Assessment/Plan 1. 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 Start Colace as ordered. Counseled to hold Colace for loose stools and restart if constipations. Start Miralax if Colace does not produce stool. Encouraged increased fluid intake.  - docusate sodium (COLACE) 100 MG capsule; Take 2 capsules (200 mg total) by mouth 2 (two) times daily.  Dispense: 120 capsule; Refill: 3  2. PARKINSON'S DISEASE -  Causing constipation and dementia. Discussed with spouse that he needs to supervise all medications, including OTC medications. Continue current Sinemet.   Next appt: March 1st, 2017 with Dr. Mariea Clonts for CPE or sooner if needed.   Mariana Kaufman, RN, BSN Student- Nurse Practitioner- [facility] [address], [place] [address] Office Phone:  [phone] Office Fax:  [phone]

## 2014-12-16 NOTE — Telephone Encounter (Signed)
Left message on voicemail for patient to return call when available   

## 2014-12-16 NOTE — Telephone Encounter (Signed)
Patients husband returned call from phone message, I informed him about his wife's lab test he had no questions. Said he would give her the good news.

## 2014-12-24 ENCOUNTER — Telehealth: Payer: Self-pay | Admitting: *Deleted

## 2014-12-24 NOTE — Telephone Encounter (Signed)
Try the miralax daily until she has a bm.  He will need to monitor to find out if she did b/c she may not remember.

## 2014-12-24 NOTE — Telephone Encounter (Signed)
Husband, Richardson Landry,  called and stated that patient is still having some lower abdominal discomfort with the constipation and still using the stool softner. He thinks the medication needs to be stronger. He stated that she has been on stool softner for awhile. He stated that he is going to go and get Miralax today and try it but don't know what to do after this. Please Advise.

## 2014-12-24 NOTE — Telephone Encounter (Signed)
Patient husband,steve notified and agreed.

## 2014-12-25 ENCOUNTER — Encounter: Payer: Self-pay | Admitting: Internal Medicine

## 2015-01-06 ENCOUNTER — Encounter: Payer: Self-pay | Admitting: Internal Medicine

## 2015-01-06 ENCOUNTER — Non-Acute Institutional Stay: Payer: Medicare Other | Admitting: Internal Medicine

## 2015-01-06 VITALS — BP 110/72 | HR 82 | Temp 98.0°F | Wt 161.0 lb

## 2015-01-06 DIAGNOSIS — G20 Parkinson's disease: Secondary | ICD-10-CM

## 2015-01-06 DIAGNOSIS — K592 Neurogenic bowel, not elsewhere classified: Secondary | ICD-10-CM

## 2015-01-06 DIAGNOSIS — M545 Low back pain, unspecified: Secondary | ICD-10-CM

## 2015-01-06 DIAGNOSIS — K5900 Constipation, unspecified: Secondary | ICD-10-CM

## 2015-01-06 DIAGNOSIS — G20A1 Parkinson's disease without dyskinesia, without mention of fluctuations: Secondary | ICD-10-CM

## 2015-01-06 MED ORDER — MENTHOL (TOPICAL ANALGESIC) 4 % EX GEL: 1.0000 "application " | Freq: Four times a day (QID) | CUTANEOUS | Status: DC | PRN

## 2015-01-06 NOTE — Progress Notes (Signed)
Patient ID: Kristina Carr, female   DOB: Dec 12, 1945, 69 y.o.   MRN: [id number]   Location:  [facility]  Code Status: DNR  Goals of Care:Advanced Directive information Does patient have an advance directive?: Yes, Type of Advance Directive: [place], Does patient want to make changes to advanced directive?: Yes - information given  Chief Complaint  Patient presents with  . Acute Visit    abdominal pain getting worse, trouble with bowel movement, been on Miralax 2-3 days. Here with husband    HPI: Patient is a 69 y.o.  White female with Parkinsons's disease and lewy body dementia seen in the [facility] today for unrelenting chronic constipation with abdominal pain.  On stool softener 2 in the am and 2 in the pm and has had 2-3 days of miralax once a day. Has two to three small solid pieces.  She does not eat regular.  When she eats, she eats a lot.  Does not sleep much.  Takes clonazepam for sleep, but not helping.  Strains a lot.  Feels like sharp edges inside the bowels.    She is not drinking a lot of water.  She knows she has to pee more then.    Exercise difficult.  Can easily hurt her back and have to rest and down until medication kicks in.  Using tylenol ES.    Cologuard testing was negative 12/08/14.     Now on two tablets 4 times a day with her sinemet. She will crash and have to go rest.  When she gets the two pills only at noon and 5pm (2 carbidopa/levodopa).  In 30-45 mins, you wouldn't know she had PD.  1/3 of the way into the afternoon, it is wearing off.    Her periods of dementia are very severe.  Has trouble telling time.  Her husband admits to frustration.  Lately she has seen dogs in the house.  Will see people also that are more bothersome.  She gets fearful.  She has not recognized Richardson Landry a couple of times.    Review of Systems:  Review of Systems  Constitutional: Positive for malaise/fatigue. Negative for fever and chills.    HENT: Negative for hearing loss.   Eyes: Negative for blurred vision.  Respiratory: Negative for shortness of breath.   Cardiovascular: Negative for chest pain.  Gastrointestinal: Positive for abdominal pain and constipation. Negative for nausea, vomiting, diarrhea, blood in stool and melena.  Genitourinary: Positive for urgency and frequency. Negative for dysuria.  Musculoskeletal: Positive for myalgias and back pain. Negative for falls.  Skin: Negative for rash.  Neurological: Positive for tremors. Negative for dizziness, seizures, loss of consciousness and weakness.  Endo/Heme/Allergies: Does not bruise/bleed easily.  Psychiatric/Behavioral: Positive for depression, hallucinations and memory loss. The patient has insomnia.     Past Medical History  Diagnosis Date  . Hiatal hernia   . Esophageal reflux   . Personal history of other disorders of nervous system and sense organs   . Other and unspecified hyperlipidemia   . Abdominal pain, epigastric   . Flatulence, eructation, and gas pain   . Irritable bowel syndrome   . Pain in joint, site unspecified   . Other abnormal glucose   . Abnormality of gait   . Cervicalgia   . Lumbago   . Nonspecific elevation of levels of transaminase or lactic acid dehydrogenase (LDH)   . Other malaise and fatigue   . Anxiety state, unspecified   .  Palpitations   . Other chest pain   . Restless leg syndrome   . Other and unspecified hyperlipidemia   . Parkinson's disease     Dr Tonye Royalty, [facility]  . Detrusor instability   . Bladder cancer ([place]) 2011    DR. GRAPEY  . PONV (postoperative nausea and vomiting)   . History of recurrent UTIs   . Arthritis   . Lewy body dementia     Past Surgical History  Procedure Laterality Date  . Appendectomy    . Lumbar laminectomy      X 3; last @ age34  . Bunionectomy      left  . Trigger finger release      X2  . Bladder tumor excision    . Cesarean section    . Chemotherapy      FLUSH-CANCER EXCISED  CYSTOSCOPICALLY FOLLOWED BY INSTALLATION OF CHEMOTHERAPY.  . Breast surgery      Biopsy-benign  . Colonoscopy  2011    neg; Dr Sharlett Iles  . Total hip arthroplasty Left 04/12/2012    Procedure: TOTAL HIP ARTHROPLASTY ANTERIOR APPROACH;  Surgeon: Gearlean Alf, MD;  Location: [facility];  Service: Orthopedics;  Laterality: Left;  . Upper gi endoscopy      hiatal hernia    Social History:   reports that she has never smoked. She has never used smokeless tobacco. She reports that she drinks alcohol. She reports that she does not use illicit drugs.  Allergies  Allergen Reactions  . Omeprazole     REACTION: HIVES Because of a history of documented adverse serious drug reaction;Medi Alert bracelet  is recommended  . Simvastatin     REACTION: LEG CRAMPS  . Venlafaxine     REACTION: nausea    Medications: Patient's Medications  New Prescriptions   No medications on file  Previous Medications   ACETAMINOPHEN-CODEINE (TYLENOL #3) 300-30 MG TABLET    Take 1 tablet by mouth every 8 (eight) hours as needed for moderate pain.   AMOXICILLIN-CLAVULANATE (AUGMENTIN) 875-125 MG PER TABLET    Take 1 tablet by mouth 2 (two) times daily.   CARBIDOPA-LEVODOPA (SINEMET IR) 25-100 MG PER TABLET    Take 2 tablets by mouth 4 (four) times daily.    CLONAZEPAM (KLONOPIN) 0.5 MG TABLET    Take 0.5 mg by mouth every evening.   DONEPEZIL (ARICEPT) 5 MG TABLET    Take 5 mg by mouth at bedtime. Form memory   PANTOPRAZOLE (PROTONIX) 20 MG TABLET    TAKE 1 TABLET BY MOUTH EVERY DAY   PAROXETINE (PAXIL) 20 MG TABLET    Take 40 mg by mouth every morning.    POLYETHYLENE GLYCOL (MIRALAX / GLYCOLAX) PACKET    Take 17 g by mouth daily.   QUETIAPINE (SEROQUEL) 50 MG TABLET    Take 50 mg by mouth every evening.   ROPINIROLE (REQUIP XL) 8 MG 24 HR TABLET    Take 8 mg by mouth at bedtime.  Modified Medications   No medications on file  Discontinued Medications   DOCUSATE SODIUM (COLACE) 100 MG CAPSULE    Take 2 capsules  (200 mg total) by mouth 2 (two) times daily.   DONEPEZIL (ARICEPT) 5 MG TABLET    Take 5 mg by mouth at bedtime.   MIRABEGRON ER (MYRBETRIQ) 25 MG TB24 TABLET    Take 25 mg by mouth daily.     Physical Exam: Filed Vitals:   01/06/15 1641  BP: 110/72  Pulse: 82  Temp:  98 F (36.7 C)  TempSrc: Oral  Weight: 161 lb (73.029 kg)  SpO2: 94%   Body mass index is 27.62 kg/(m^2). Physical Exam  Constitutional: She appears well-developed and well-nourished.  Cardiovascular: Normal rate, regular rhythm and normal heart sounds.   Pulmonary/Chest: Effort normal and breath sounds normal.  Abdominal: Soft. Bowel sounds are normal. She exhibits no distension and no mass. There is no tenderness. There is no rebound and no guarding.  Neurological: She is alert.  Oriented to person and place, not time; one moment she is clear and makes sense, next minute, she is totally unclear and illogical; tremulous with unsteady gait  Skin: Skin is warm and dry.  Psychiatric:  Depressed, tearful at times during appt     Labs reviewed:  Lab Results  Component Value Date   HGBA1C 6.0 02/26/2012    Patient Care Team: Gayland Curry, DO as PCP - General (Geriatric Medicine) Gaynelle Arabian, MD as Consulting Physician (Orthopedic Surgery) Ricki Rodriguez, MD as Attending Physician (Psychiatry) Amy Martinique, MD as Consulting Physician (Dermatology) Rana Snare, MD as Consulting Physician (Urology) Darden Dates, PA-C as Consulting Physician (Neurology)  Assessment/Plan 1. 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 Midline low back pain without sciatica - advised to try a topical agent for her low back pain when it is flaring up  - Menthol, Topical Analgesic, 4 % GEL; Apply 1 application topically 4 (four) times daily as needed (back pain).   Dispense: 473 mL; Refill: 3  3. PARKINSON'S DISEASE -advised to follow up with the neurologist for adjustments in the sinemet-IR and requip  4.  Lewy body dementia: -cont seroquel 50mg  at bedtime--I was hesitant to up this with the complications of too much antipsychotic in these patients with PD -also continues paxil for her depression and klonopin for anxiety but these are not too effective for her at this time -poor sleep and vivid hallucinations sometimes of people in the home that are not welcome not just the animals she likes to see -she is needing more and more help and her husband is having to keep track of her bms -recommended more home care--they seem hesitant -also will supply Mr. Snead with a dementia guide from the [employer] which has been very helpful with situations for other patients  Appears she needs a panel of labs done soon  Next appt:  Keep 3/1 appt, but call and return sooner prn  Sammuel Blick L. Truitt Cruey, D.O. [facility] [address], [place] [address] Cell Phone (Mon-Fri 8am-5pm):  [phone] On Call:  [phone] & follow prompts after 5pm & weekends Office Phone:  [phone] Office Fax:  [phone]

## 2015-01-19 ENCOUNTER — Encounter: Payer: Self-pay | Admitting: Internal Medicine

## 2015-02-18 ENCOUNTER — Telehealth: Payer: Self-pay | Admitting: Internal Medicine

## 2015-02-18 NOTE — Telephone Encounter (Signed)
FYI,  Several attempts have been made including contacting husband and mailing a letter to the patient to with no response to schedule patient for ordered Bone Density     [employer] for data entry

## 2015-02-18 NOTE — Telephone Encounter (Signed)
Ok, no idea why her husband does not respond unless pt is deleting messages and not remembering she's done it.

## 2015-03-12 DIAGNOSIS — G3183 Dementia with Lewy bodies: Secondary | ICD-10-CM | POA: Diagnosis not present

## 2015-03-12 DIAGNOSIS — F0281 Dementia in other diseases classified elsewhere with behavioral disturbance: Secondary | ICD-10-CM | POA: Diagnosis not present

## 2015-04-01 ENCOUNTER — Other Ambulatory Visit: Payer: Self-pay

## 2015-04-01 DIAGNOSIS — R5381 Other malaise: Secondary | ICD-10-CM | POA: Diagnosis not present

## 2015-04-01 DIAGNOSIS — E785 Hyperlipidemia, unspecified: Secondary | ICD-10-CM | POA: Diagnosis not present

## 2015-04-01 DIAGNOSIS — R7309 Other abnormal glucose: Secondary | ICD-10-CM | POA: Diagnosis not present

## 2015-04-01 DIAGNOSIS — E559 Vitamin D deficiency, unspecified: Secondary | ICD-10-CM | POA: Diagnosis not present

## 2015-04-01 DIAGNOSIS — Z1231 Encounter for screening mammogram for malignant neoplasm of breast: Secondary | ICD-10-CM

## 2015-04-01 DIAGNOSIS — E538 Deficiency of other specified B group vitamins: Secondary | ICD-10-CM | POA: Diagnosis not present

## 2015-04-01 LAB — HEPATIC FUNCTION PANEL
ALT: 5 U/L — AB (ref 7–35)
AST: 14 U/L (ref 13–35)
Alkaline Phosphatase: 50 U/L (ref 25–125)
BILIRUBIN, TOTAL: 0.6 mg/dL

## 2015-04-01 LAB — LIPID PANEL
Cholesterol: 196 mg/dL (ref 0–200)
HDL: 45 mg/dL (ref 35–70)
LDL Cholesterol: 133 mg/dL
LDL/HDL RATIO: 3
Triglycerides: 153 mg/dL (ref 40–160)

## 2015-04-01 LAB — BASIC METABOLIC PANEL
BUN: 16 mg/dL (ref 4–21)
CREATININE: 0.9 mg/dL (ref 0.5–1.1)
GLUCOSE: 96 mg/dL
Potassium: 4.3 mmol/L (ref 3.4–5.3)
SODIUM: 141 mmol/L (ref 137–147)

## 2015-04-01 LAB — CBC AND DIFFERENTIAL
HEMATOCRIT: 42 % (ref 36–46)
Hemoglobin: 14.2 g/dL (ref 12.0–16.0)
Platelets: 252 10*3/uL (ref 150–399)
WBC: 5.2 10^3/mL

## 2015-04-07 ENCOUNTER — Non-Acute Institutional Stay: Payer: Medicare Other | Admitting: Internal Medicine

## 2015-04-07 ENCOUNTER — Encounter: Payer: Self-pay | Admitting: Internal Medicine

## 2015-04-07 VITALS — BP 118/62 | HR 70 | Temp 98.0°F | Ht 64.0 in | Wt 156.0 lb

## 2015-04-07 DIAGNOSIS — K592 Neurogenic bowel, not elsewhere classified: Secondary | ICD-10-CM | POA: Insufficient documentation

## 2015-04-07 DIAGNOSIS — R1032 Left lower quadrant pain: Secondary | ICD-10-CM

## 2015-04-07 DIAGNOSIS — Z0000 Encounter for general adult medical examination without abnormal findings: Secondary | ICD-10-CM | POA: Diagnosis not present

## 2015-04-07 DIAGNOSIS — G3183 Dementia with Lewy bodies: Secondary | ICD-10-CM | POA: Diagnosis not present

## 2015-04-07 DIAGNOSIS — K5900 Constipation, unspecified: Secondary | ICD-10-CM

## 2015-04-07 DIAGNOSIS — Z124 Encounter for screening for malignant neoplasm of cervix: Secondary | ICD-10-CM

## 2015-04-07 DIAGNOSIS — Z01419 Encounter for gynecological examination (general) (routine) without abnormal findings: Secondary | ICD-10-CM | POA: Insufficient documentation

## 2015-04-07 DIAGNOSIS — G20 Parkinson's disease: Secondary | ICD-10-CM

## 2015-04-07 DIAGNOSIS — F0280 Dementia in other diseases classified elsewhere without behavioral disturbance: Secondary | ICD-10-CM | POA: Diagnosis not present

## 2015-04-07 DIAGNOSIS — R350 Frequency of micturition: Secondary | ICD-10-CM

## 2015-04-07 DIAGNOSIS — G20A1 Parkinson's disease without dyskinesia, without mention of fluctuations: Secondary | ICD-10-CM

## 2015-04-07 DIAGNOSIS — E559 Vitamin D deficiency, unspecified: Secondary | ICD-10-CM | POA: Insufficient documentation

## 2015-04-07 DIAGNOSIS — C679 Malignant neoplasm of bladder, unspecified: Secondary | ICD-10-CM | POA: Diagnosis not present

## 2015-04-07 DIAGNOSIS — E538 Deficiency of other specified B group vitamins: Secondary | ICD-10-CM

## 2015-04-07 MED ORDER — VITAMIN D 50 MCG (2000 UT) PO CAPS: 1.0000 | ORAL_CAPSULE | Freq: Every day | ORAL | Status: DC

## 2015-04-07 NOTE — Progress Notes (Signed)
Patient ID: Kristina Carr, female   DOB: March 04, 1945, 70 y.o.   MRN: [id number] MMSE 21/30 failed clock drawing

## 2015-04-07 NOTE — Progress Notes (Signed)
Patient ID: Kristina Carr, female   DOB: 1945-12-18, 70 y.o.   MRN: [id number] Extensive discussion was held with pt's husband and her sister who was visiting for two weeks in reference to her dementia and increased need for assistance with ADLs and social support.  Discussed importance of having caregivers to assist with her care so her husband can be her husband.  They have tried some caregivers, but not all have done what Chris needs--would sit and watch TV.  Her sister is going to help Mr. Bebout get this all arranged through [facility].  He is also going to establish here due to some memory concerns and his current PCP not staying consistent.

## 2015-04-07 NOTE — Progress Notes (Signed)
Patient ID: Kristina Carr, female   DOB: 10/16/1945, 70 y.o.   MRN: [id number]   Location:  [facility] (12) Provider: Cruise Baumgardner L. Mariea Clonts, D.O., C.M.D.  Patient Care Team: Gayland Curry, DO as PCP - General (Geriatric Medicine) Gaynelle Arabian, MD as Consulting Physician (Orthopedic Surgery) Ricki Rodriguez, MD as Attending Physician (Psychiatry) Amy Martinique, MD as Consulting Physician (Dermatology) Rana Snare, MD as Consulting Physician (Urology) Darden Dates, PA-C as Consulting Physician (Neurology)  Extended Emergency Contact Information Primary Emergency Contact: Arta Bruce Address: [address]          [place], [place] [address] [place] Work Phone: [phone] Mobile Phone: [phone] Relation: Spouse Secondary Emergency Contact: Deatra James, [place] [employer] Phone: [phone] Relation: Son  Code Status: DNR Goals of Care: Advanced Directive information Advanced Directives 04/07/2015  Does patient have an advance directive? -  Type of Advance Directive [place]  Does patient want to make changes to advanced directive? -  Copy of advanced directive(s) in chart? Yes     Chief Complaint  Patient presents with  . Annual Exam    Wellness exam  Here with husband and sister [name]   . Medical Management of Chronic Issues    medication management, Parkinson's, dementia  . abdominal bloating    pain 40 lbs weight loss since June, ? ovaries, last femal exam been a long time. Due for colonoscopy, mother had colon cancer age 47, last colonoscopy 5-6 years ago.   Marland Kitchen MMSE    21/30 failed clock drawing    HPI: Patient is a 70 y.o. female seen in today for an annual wellness exam and med mgt of chronic diseases.    C/o abdominal bloating, pain and 40 lb weight loss since June.  Cologuard was negative 12/08/14-done as pt refused cscope.   Her mother had colon cancer at age 33.  Last cscope on pt was 5-6 yrs ago. Has been eating grain cereal and she has been having bowel movements.  Pain doing better w/ the grain cereal.  If she eats a little more, her pain is better and she's more regular.  She doesn't want to eat supper a lot of times unless they come to [facility].  Takes her meds on an empty stomach and goes back to bed.    Carbidopa 4x a day now.  Can walk fast and almost normal [place]  Is walking while her sister is here.  Back got worse with the water aerobics.  She is not motivated. Medication still wearing off and leading to extreme fatigue.    Had bladder tumor.  Last seen about 3 mos ago.  Dr. Cy Blamer notes are in the system--she is to see him next in 1 year from that visit.    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  She underwent resection and mitomycin therapy.  It was a local tumor w/o any invasion.  She has had annual negative cystoscopies since then and some other lab testing that is specific for this that's been negative.    Had to urinate every 10 mins yesterday and this am.  No dysuria, has lower abdominal pain, no fever, chills.      Depression screen [facility] 2/9 04/07/2015 03/11/2014  Decreased Interest 0 3  Down, Depressed, Hopeless 1 2  PHQ -  2 Score 1 5  Altered sleeping - 3  Tired, decreased energy - 3  Change in appetite - 3  Feeling bad or failure about yourself  - 1  Trouble concentrating - 1  Moving slowly or fidgety/restless - 3  Suicidal thoughts - 0  PHQ-9 Score - 19    Fall Risk  04/07/2015 11/23/2014 03/11/2014  Falls in the past year? Yes No Yes  Number falls in past yr: 2 or more - 2 or more  Risk for fall due to : Impaired balance/gait - -   MMSE - Mini Mental State Exam 04/07/2015  Orientation to time 2  Orientation to Place 5  Registration 3  Attention/ Calculation 3  Recall 1  Language- name 2 objects 2  Language- repeat 1  Language- follow 3 step command 2    Language- read & follow direction 1  Write a sentence 1  Copy design 0  Total score 21  failed clock drawing  Health Maintenance  Topic Date Due  . Hepatitis C Screening  01/13/1946  . INFLUENZA VACCINE  09/07/2015  . PNA vac Low Risk Adult (2 of 2 - PPSV23) 11/23/2015  . MAMMOGRAM  04/15/2016  . TETANUS/TDAP  07/21/2018  . COLONOSCOPY  03/21/2020  . DEXA SCAN  Completed  . ZOSTAVAX  Completed   They've had some sporadic help off and on.  She doesn't like having people in her house.  Does have life alert button, but doesn't always wear. Her sister notes that she needs help 99% of the time.    Urinary incontinence? No Functional Status Survey: Is the patient deaf or have difficulty hearing?: No Does the patient have difficulty seeing, even when wearing glasses/contacts?: No Does the patient have difficulty concentrating, remembering, or making decisions?: Yes Does the patient have difficulty walking or climbing stairs?: Yes Does the patient have difficulty dressing or bathing?: Yes Does the patient have difficulty doing errands alone such as visiting a doctor's office or shopping?: Yes Exercise?  Doing some walking now that her sister is here, but does not normally. Diet?  Eats poorly and says she has some difficulty with her swallowing also.  Visual Acuity Screening   Right eye Left eye Both eyes  Without correction: 20/50 20/70 20/50   With correction:      Dentition:  No problems sees dentist Pain:  Low back, lower abdomen, see above.  Past Medical History  Diagnosis Date  . Hiatal hernia   . Esophageal reflux   . Personal history of other disorders of nervous system and sense organs   . Other and unspecified hyperlipidemia   . Abdominal pain, epigastric   . Flatulence, eructation, and gas pain   . Irritable bowel syndrome   . Pain in joint, site unspecified   . Other abnormal glucose   . Abnormality of gait   . Cervicalgia   . Lumbago   . Nonspecific elevation  of levels of transaminase or lactic acid dehydrogenase (LDH)   . Other malaise and fatigue   . Anxiety state, unspecified   . Palpitations   . Other chest pain   . Restless leg syndrome   . Other and unspecified hyperlipidemia   . Parkinson's disease     Dr Tonye Royalty, [facility]  . Detrusor instability   . Bladder cancer ([facility]) 2011    DR. GRAPEY  . PONV (postoperative nausea and vomiting)   . History of recurrent UTIs   . Arthritis   . Lewy body dementia   .  Vitamin D deficiency     Past Surgical History  Procedure Laterality Date  . Appendectomy    . Lumbar laminectomy      X 3; last @ age34  . Bunionectomy      left  . Trigger finger release      X2  . Bladder tumor excision    . Cesarean section    . Chemotherapy      FLUSH-CANCER EXCISED CYSTOSCOPICALLY FOLLOWED BY INSTALLATION OF CHEMOTHERAPY.  . Breast surgery      Biopsy-benign  . Colonoscopy  2011    neg; Dr Sharlett Iles  . Total hip arthroplasty Left 04/12/2012    Procedure: TOTAL HIP ARTHROPLASTY ANTERIOR APPROACH;  Surgeon: Gearlean Alf, MD;  Location: [facility];  Service: Orthopedics;  Laterality: Left;  . Upper gi endoscopy      hiatal hernia     Social History   Social History  . Marital Status: Married    Spouse Name: N/A  . Number of Children: 1  . Years of Education: N/A   Occupational History  . retired     Set designer   Social History Main Topics  . Smoking status: Never Smoker   . Smokeless tobacco: Never Used     Comment: social  . Alcohol Use: No  . Drug Use: No  . Sexual Activity: Yes    Birth Control/ Protection: Post-menopausal   Other Topics Concern  . Not on file   Social History Narrative   Lives at [facility]    Married -[name]   Never smoked   Alcohol none   Exercise none   POA    Allergies  Allergen Reactions  . Omeprazole     REACTION: HIVES Because of a history of documented adverse serious drug reaction;Medi Alert bracelet  is recommended  . Simvastatin     REACTION:  LEG CRAMPS  . Venlafaxine     REACTION: nausea      Medication List       This list is accurate as of: 04/07/15 11:22 AM.  Always use your most recent med list.               acetaminophen-codeine 300-30 MG tablet  Commonly known as:  TYLENOL #3  Take 1 tablet by mouth every 8 (eight) hours as needed for moderate pain.     amoxicillin-clavulanate 875-125 MG tablet  Commonly known as:  AUGMENTIN  Take 1 tablet by mouth 2 (two) times daily.     carbidopa-levodopa 25-100 MG tablet  Commonly known as:  SINEMET IR  Take 2 tablets by mouth 4 (four) times daily.     clonazePAM 0.5 MG tablet  Commonly known as:  KLONOPIN  Take 0.5 mg by mouth every evening.     donepezil 5 MG tablet  Commonly known as:  ARICEPT  Take 5 mg by mouth at bedtime. Form memory     pantoprazole 20 MG tablet  Commonly known as:  PROTONIX  TAKE 1 TABLET BY MOUTH EVERY DAY     PARoxetine 20 MG tablet  Commonly known as:  PAXIL  Take 40 mg by mouth every morning.     polyethylene glycol packet  Commonly known as:  MIRALAX / GLYCOLAX  Take 17 g by mouth 2 (two) times daily.     QUEtiapine 50 MG tablet  Commonly known as:  SEROQUEL  Take 50 mg by mouth every evening.     rOPINIRole 8 MG 24 hr tablet  Commonly known  as:  REQUIP XL  Take 8 mg by mouth at bedtime.       Review of Systems:  Review of Systems  Constitutional: Negative for fever and chills.  HENT: Negative for congestion and hearing loss.   Eyes: Negative for blurred vision.  Respiratory: Negative for shortness of breath.   Cardiovascular: Negative for chest pain and leg swelling.  Gastrointestinal: Positive for abdominal pain and constipation. Negative for heartburn, nausea, vomiting, diarrhea, blood in stool and melena.  Genitourinary: Positive for frequency. Negative for dysuria and urgency.  Musculoskeletal: Positive for falls.  Skin: Negative for rash.  Neurological: Positive for tremors. Negative for dizziness and loss of  consciousness.  Psychiatric/Behavioral: Positive for depression, hallucinations and memory loss. The patient does not have insomnia.     Physical Exam: Filed Vitals:   04/07/15 1028  BP: 118/62  Pulse: 70  Temp: 98 F (36.7 C)  TempSrc: Oral  Height: 5\' 4"  (1.626 m)  Weight: 156 lb (70.761 kg)  SpO2: 95%   Body mass index is 26.76 kg/(m^2). Physical Exam  Constitutional: She appears well-developed and well-nourished. No distress.  HENT:  Head: Normocephalic and atraumatic.  Right Ear: External ear normal.  Left Ear: External ear normal.  Nose: Nose normal.  Mouth/Throat: Oropharynx is clear and moist. No oropharyngeal exudate.  Eyes: Conjunctivae and EOM are normal. Pupils are equal, round, and reactive to light.  Neck: Normal range of motion. Neck supple. No JVD present. No thyromegaly present.  Abdominal: Soft. Bowel sounds are normal. She exhibits no mass. There is tenderness. There is no rebound and no guarding. No hernia.  Mild in LLQ  Genitourinary: Vagina normal and uterus normal. Guaiac negative stool. No vaginal discharge found.  No palpable ovarian masses   Musculoskeletal: Normal range of motion. She exhibits no edema or tenderness.  Lymphadenopathy:    She has no cervical adenopathy.  Neurological: She is alert. She exhibits abnormal muscle tone.  tremulous  Psychiatric:  Tearful during visit    Labs reviewed: Basic Metabolic Panel:  Recent Labs  04/01/15  NA 141  K 4.3  BUN 16  CREATININE 0.9   Liver Function Tests:  Recent Labs  04/01/15  AST 14  ALT 5*  ALKPHOS 50   No results for input(s): LIPASE, AMYLASE in the last 8760 hours. No results for input(s): AMMONIA in the last 8760 hours. CBC:  Recent Labs  04/01/15  WBC 5.2  HGB 14.2  HCT 42  PLT 252   Lipid Panel:  Recent Labs  04/01/15  CHOL 196  HDL 45  LDLCALC 133  TRIG 153   Lab Results  Component Value Date   HGBA1C 6.0 02/26/2012   Assessment/Plan 1. Abdominal  pain, left lower quadrant - may be due to a UTI--check UA c+s, urine microalbumin (due to missing this before) - US Pelvis Complete; Future - US Abdomen Complete; Future - pt is adamant something is going on related to her bladder cancer, but most recent visit with Dr. Risa Grill was unremarkable  2. Well female exam with routine gynecological exam - PAP, IG w/ Reflex HPV when ASCU [[facility], [facility]] -was unremarkable MEDICARE ANNUAL WELLNESS ITEMS DONE--SEE HPI  3. 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 PARKINSON'S DISEASE -cont sinemet dosing as per neurology, requip  5. Lewy body dementia -cont klonopin and paxil for depression, aricept for memory, seroquel for psychosis as per [facility]  6. Malignant neoplasm of urinary bladder, unspecified  site [facility]) -follows with urology, Dr. Risa Grill -tumor was removed and she got mitomycin and cystoscopies clear since  7. Vitamin D insufficiency - Cholecalciferol (VITAMIN D) 2000 units CAPS; Take 1 capsule (2,000 Units total) by mouth daily.  Dispense: 30 capsule; Refill: 3  8. B12 deficiency -newly noted here (has had in past apparently) so will start on monthly injections  9. Urinary frequency -check UA c+s (this has developed over the past day) per her sister   Labs/tests ordered:   Orders Placed This Encounter  Procedures  . US Pelvis Complete    Standing Status: Future     Number of Occurrences:      Standing Expiration Date: 06/06/2016    Order Specific Question:  Reason for Exam (SYMPTOM  OR DIAGNOSIS REQUIRED)    Answer:  left lower quadrant abdominal pain    Order Specific Question:  Preferred imaging location?    Answer:  [facility]  . US Abdomen Complete    Standing Status: Future     Number of Occurrences:      Standing Expiration Date: 06/06/2016    Order Specific Question:  Reason for Exam (SYMPTOM  OR DIAGNOSIS REQUIRED)    Answer:  left lower quadrant abdominal  pain    Order Specific Question:  Preferred imaging location?    Answer:  [facility]  . CBC and differential    This external order was created through the Results Console.  . Basic metabolic panel    This external order was created through the Results Console.  . Lipid panel    This external order was created through the Results Console.  . Hepatic function panel    This external order was created through the Results Console.    Next appt:  05/12/2015 med mgt  Dayrin Stallone L. Judyann Casasola, D.O. [facility] [address], [place] [address] Cell Phone (Mon-Fri 8am-5pm):  [phone] On Call:  [phone] & follow prompts after 5pm & weekends Office Phone:  [phone] Office Fax:  [phone]

## 2015-04-07 NOTE — Patient Instructions (Signed)
Continue with bran cereal daily to help with your constipation. Drink plenty of water. Continue to walk for exercise during the hours when you have energy We will start you back on b12 shots monthly for your b12 deficiency.  This may help your energy levels.  Will start at your next visit. Start back on Vitamin D 2000 units daily--this will be indefinite.   We will check an ultrasound of your abdomen and pelvis to make sure there is nothing new going on in your lower abdomen. Obtain a urine sample and your sister said she would bring it by for the lab at 7am tomorrow morning.

## 2015-04-08 ENCOUNTER — Encounter: Payer: Medicare Other | Admitting: Internal Medicine

## 2015-04-08 DIAGNOSIS — R7309 Other abnormal glucose: Secondary | ICD-10-CM | POA: Diagnosis not present

## 2015-04-08 DIAGNOSIS — R350 Frequency of micturition: Secondary | ICD-10-CM | POA: Diagnosis not present

## 2015-04-08 DIAGNOSIS — R1013 Epigastric pain: Secondary | ICD-10-CM | POA: Diagnosis not present

## 2015-04-09 ENCOUNTER — Telehealth: Payer: Self-pay | Admitting: *Deleted

## 2015-04-09 DIAGNOSIS — R109 Unspecified abdominal pain: Secondary | ICD-10-CM

## 2015-04-09 LAB — PAP IG (IMAGE GUIDED): PAP Smear Comment: 0

## 2015-04-09 NOTE — Telephone Encounter (Signed)
[facility] called and stated that we needed to add an U/S Transvag. Non OB order to patient. Stated they don't do [place] Abd/Pelvic without doing this one also. Order added.

## 2015-04-21 ENCOUNTER — Ambulatory Visit
Admission: RE | Admit: 2015-04-21 | Discharge: 2015-04-21 | Disposition: A | Discharge status: Home / Self Care | Payer: Medicare Other | Source: Ambulatory Visit | Attending: Internal Medicine | Admitting: Internal Medicine

## 2015-04-21 DIAGNOSIS — R1032 Left lower quadrant pain: Secondary | ICD-10-CM

## 2015-04-21 DIAGNOSIS — R109 Unspecified abdominal pain: Secondary | ICD-10-CM

## 2015-04-21 DIAGNOSIS — D259 Leiomyoma of uterus, unspecified: Secondary | ICD-10-CM | POA: Diagnosis not present

## 2015-04-28 ENCOUNTER — Ambulatory Visit
Admission: RE | Admit: 2015-04-28 | Discharge: 2015-04-28 | Disposition: A | Discharge status: Home / Self Care | Payer: Medicare Other | Source: Ambulatory Visit | Attending: Internal Medicine | Admitting: Internal Medicine

## 2015-04-28 ENCOUNTER — Ambulatory Visit
Admission: RE | Admit: 2015-04-28 | Discharge: 2015-04-28 | Disposition: A | Discharge status: Home / Self Care | Payer: Medicare Other | Source: Ambulatory Visit

## 2015-04-28 DIAGNOSIS — Z1231 Encounter for screening mammogram for malignant neoplasm of breast: Secondary | ICD-10-CM | POA: Diagnosis not present

## 2015-04-28 DIAGNOSIS — M85851 Other specified disorders of bone density and structure, right thigh: Secondary | ICD-10-CM | POA: Diagnosis not present

## 2015-04-28 DIAGNOSIS — Z780 Asymptomatic menopausal state: Secondary | ICD-10-CM

## 2015-04-29 ENCOUNTER — Telehealth: Payer: Self-pay | Admitting: *Deleted

## 2015-04-29 ENCOUNTER — Encounter: Payer: Self-pay | Admitting: *Deleted

## 2015-04-29 NOTE — Telephone Encounter (Signed)
Called patient's husband to inform him that an appointment has been scheduled for his wife on May 05, 2015@ 2:00 pm with Dr. Phineas Real.

## 2015-05-03 ENCOUNTER — Telehealth: Payer: Self-pay

## 2015-05-03 NOTE — Telephone Encounter (Signed)
Husband called, he had received a call about GYN appt for his wife, she thinks she has already seen a GYN. Told Mr Bomberger that Dr. Mariea Clonts did a Pap smear when she saw her 04/07/15, then she had the [place] 04/21/15, she may be confused about these exams. Because of the [place] and need for biopsy Dr. Mariea Clonts wants her to see GYN. Her appt is with Dr. Phineas Real 05/05/15 at 2:00. He was clearer about this now.

## 2015-05-04 ENCOUNTER — Other Ambulatory Visit: Payer: Self-pay | Admitting: Gynecology

## 2015-05-04 ENCOUNTER — Telehealth: Payer: Self-pay

## 2015-05-04 DIAGNOSIS — R9389 Abnormal findings on diagnostic imaging of other specified body structures: Secondary | ICD-10-CM

## 2015-05-04 NOTE — Telephone Encounter (Signed)
Staff message from Dr. Loetta Rough received "I was looking ahead at tomorrow's schedule. Patient has appointment to see me for endometrial biopsy. She had ultrasound done elsewhere that showed a thicker endometrium. I reviewed the pictures from the ultrasound and it looks like she may have an endometrial polyp. My recommendation would be for her to schedule a sonohysterogram that we can take a closer look and rule out polyp. Told the patient that we can save her from coming to see me tomorrow and then me scheduling the sonohysterogram afterwards. I can see her and examine her at the same time as the sonohysterogram as scheduled. "  I called patient's husband Richardson Landry. He said patient has dementia and has sitter 8 hours a day. I explained above to him. He understood. We will cancel appt for tomorrow. He was not at a place he could hold on while we rescheduled and he asked if we call him in the morning with new date/ time for [facility]. I spoke with Chauncey Reading. And she will call him in the morning with new info.

## 2015-05-05 ENCOUNTER — Other Ambulatory Visit: Payer: Self-pay | Admitting: Gynecology

## 2015-05-05 ENCOUNTER — Ambulatory Visit: Payer: Medicare Other | Admitting: Gynecology

## 2015-05-05 DIAGNOSIS — R9389 Abnormal findings on diagnostic imaging of other specified body structures: Secondary | ICD-10-CM

## 2015-05-05 DIAGNOSIS — D251 Intramural leiomyoma of uterus: Secondary | ICD-10-CM

## 2015-05-12 ENCOUNTER — Encounter: Payer: Self-pay | Admitting: Internal Medicine

## 2015-05-12 ENCOUNTER — Non-Acute Institutional Stay (INDEPENDENT_AMBULATORY_CARE_PROVIDER_SITE_OTHER): Payer: Medicare Other | Admitting: Internal Medicine

## 2015-05-12 VITALS — BP 112/70 | HR 84 | Temp 97.7°F | Resp 20 | Ht 64.0 in | Wt 157.4 lb

## 2015-05-12 DIAGNOSIS — E538 Deficiency of other specified B group vitamins: Secondary | ICD-10-CM | POA: Diagnosis not present

## 2015-05-12 MED ORDER — CYANOCOBALAMIN 1000 MCG/ML IJ SOLN
1000.0000 ug | INTRAMUSCULAR | Status: DC
  Administered 2015-05-12 – 2015-06-16 (×2): 1000 ug via INTRAMUSCULAR

## 2015-05-12 MED ORDER — CYANOCOBALAMIN 1000 MCG/ML IJ SOLN: 1000.0000 ug | INTRAMUSCULAR | Status: DC

## 2015-05-12 NOTE — Progress Notes (Signed)
Patient ID: Kristina Carr, female   DOB: 02/19/1945, 70 y.o.   MRN: [id number] Pt was to come for both a follow up appt and her B12 injection, but did not arrive until 20 mins past her appt time.  B12 was administered by CMA and pt to return in another month for a regular visit.  Apparently, she rescheduled her gyn appt until next week with Dr. Phineas Real for the possible uterine biopsy for thickening seen on ultrasound.  Hopefully, if I see her in a month, we'll know what's going on there.

## 2015-05-13 ENCOUNTER — Encounter: Payer: Self-pay | Admitting: *Deleted

## 2015-05-19 ENCOUNTER — Ambulatory Visit (INDEPENDENT_AMBULATORY_CARE_PROVIDER_SITE_OTHER): Payer: Medicare Other | Admitting: Gynecology

## 2015-05-19 ENCOUNTER — Other Ambulatory Visit: Payer: Self-pay | Admitting: Gynecology

## 2015-05-19 ENCOUNTER — Ambulatory Visit (INDEPENDENT_AMBULATORY_CARE_PROVIDER_SITE_OTHER): Payer: Medicare Other

## 2015-05-19 ENCOUNTER — Encounter: Payer: Self-pay | Admitting: Gynecology

## 2015-05-19 VITALS — BP 110/70 | Ht 64.0 in

## 2015-05-19 DIAGNOSIS — R9389 Abnormal findings on diagnostic imaging of other specified body structures: Secondary | ICD-10-CM

## 2015-05-19 DIAGNOSIS — N949 Unspecified condition associated with female genital organs and menstrual cycle: Secondary | ICD-10-CM

## 2015-05-19 DIAGNOSIS — D251 Intramural leiomyoma of uterus: Secondary | ICD-10-CM | POA: Diagnosis not present

## 2015-05-19 DIAGNOSIS — R938 Abnormal findings on diagnostic imaging of other specified body structures: Secondary | ICD-10-CM

## 2015-05-19 DIAGNOSIS — N83202 Unspecified ovarian cyst, left side: Secondary | ICD-10-CM | POA: Diagnosis not present

## 2015-05-19 DIAGNOSIS — N840 Polyp of corpus uteri: Secondary | ICD-10-CM

## 2015-05-19 NOTE — Progress Notes (Addendum)
    Kristina Carr 03/12/1945 [id number]        70 y.o.  G1P1001 Presents for sonohysterogram with her friend due to a recent ultrasound ordered for left lower quadrant pain for 3 months showed a thicker endometrial echo at 6.2 mm. Otherwise normal ultrasound. Patient having no bleeding or other GYN complaints. History of Parkinson's disease and dementia.  Past medical history,surgical history, problem list, medications, allergies, family history and social history were all reviewed and documented in the EPIC chart.  Directed ROS with pertinent positives and negatives documented in the history of present illness/assessment and plan.  Exam: [place] assistant Filed Vitals:   05/19/15 1112  BP: 110/70  Height: 5\' 4"  (1.626 m)   General appearance:  Normal Abdomen soft nontender without masses guarding rebound Pelvic external BUS vagina with atrophic changes. Cervix atrophic. Uterus normal size midline mobile nontender. Adnexa without masses or tenderness.  Ultrasound shows uterus normal size with several small myomas 20 mm, 13 mm. Endometrial echo with some fluid. Measurement 2.9 mm excluding fluid. Echogenic focus noted within cavity. Right ovary normal. Left ovary with 27 mm mean avascular simple cyst. Cul-de-sac negative.  Sonohysterogram performed, sterile technique, single-tooth tenaculum anterior lip stabilization, easy catheter introduction, good distention with 2 small defects consistent with polyps 7 x 6 mm and 13 x 5 mm. Endometrial sample taken with scant return.  Assessment/Plan:  70 y.o. G1P1001 with thicker endometrial echo on ultrasound as incidental finding. No bleeding or other symptoms. Ultrasound here shows small simple avascular cyst left adnexa and 2 small endometrial polyps. Endometrial biopsy was taken but I suspect this is going to return insufficient. This is consistent with her thin endometrium. I reviewed the situation with the patient and her friend. Options  for hysteroscopy D&C with resection versus expectant management with follow up ultrasound reviewed. Patient relates that she was told because of her Parkinson's disease that she really needed to avoid any form of surgery if possible. She would prefer expectant management. Cannot exclude malignancy although unlikely. Patient comfortable with expectant management and will repeat her ultrasound in 3-6 months and we will follow the endometrial echo. She will report if she has any symptoms such as bleeding.  We will call her with the biopsy results in several days.    Anastasio Auerbach MD, 11:43 AM 05/19/2015

## 2015-05-19 NOTE — Patient Instructions (Signed)
Follow up for repeat ultrasound in 3-6 months.

## 2015-05-19 NOTE — Progress Notes (Signed)
CXray & mammograms not completed as recommended

## 2015-05-20 ENCOUNTER — Other Ambulatory Visit: Payer: Self-pay | Admitting: Gynecology

## 2015-05-20 DIAGNOSIS — N840 Polyp of corpus uteri: Secondary | ICD-10-CM

## 2015-05-24 ENCOUNTER — Other Ambulatory Visit: Payer: Self-pay | Admitting: Internal Medicine

## 2015-06-16 ENCOUNTER — Encounter: Payer: Self-pay | Admitting: Internal Medicine

## 2015-06-16 ENCOUNTER — Non-Acute Institutional Stay: Payer: Medicare Other | Admitting: Internal Medicine

## 2015-06-16 VITALS — BP 118/62 | HR 81 | Temp 98.5°F | Ht 64.0 in | Wt 156.0 lb

## 2015-06-16 DIAGNOSIS — G3183 Dementia with Lewy bodies: Secondary | ICD-10-CM

## 2015-06-16 DIAGNOSIS — F0280 Dementia in other diseases classified elsewhere without behavioral disturbance: Secondary | ICD-10-CM | POA: Diagnosis not present

## 2015-06-16 DIAGNOSIS — K5900 Constipation, unspecified: Secondary | ICD-10-CM | POA: Diagnosis not present

## 2015-06-16 DIAGNOSIS — E538 Deficiency of other specified B group vitamins: Secondary | ICD-10-CM

## 2015-06-16 DIAGNOSIS — G20 Parkinson's disease: Secondary | ICD-10-CM

## 2015-06-16 DIAGNOSIS — R1032 Left lower quadrant pain: Secondary | ICD-10-CM

## 2015-06-16 DIAGNOSIS — K592 Neurogenic bowel, not elsewhere classified: Secondary | ICD-10-CM

## 2015-06-16 DIAGNOSIS — G20A1 Parkinson's disease without dyskinesia, without mention of fluctuations: Secondary | ICD-10-CM

## 2015-06-16 DIAGNOSIS — N949 Unspecified condition associated with female genital organs and menstrual cycle: Secondary | ICD-10-CM

## 2015-06-16 DIAGNOSIS — C679 Malignant neoplasm of bladder, unspecified: Secondary | ICD-10-CM | POA: Diagnosis not present

## 2015-06-16 DIAGNOSIS — D251 Intramural leiomyoma of uterus: Secondary | ICD-10-CM | POA: Diagnosis not present

## 2015-06-16 DIAGNOSIS — N840 Polyp of corpus uteri: Secondary | ICD-10-CM

## 2015-06-16 NOTE — Progress Notes (Signed)
Location:  Occupational psychologist of Service:  Clinic (12)  Provider: Delon Revelo L. Mariea Clonts, D.O., C.M.D.  Code Status: DNR Goals of Care:  Advanced Directives 06/16/2015  Does patient have an advance directive? Yes  Type of Advance Directive [employer]  Does patient want to make changes to advanced directive? -  Copy of advanced directive(s) in chart? Yes   Chief Complaint  Patient presents with  . Medical Management of Chronic Issues    follow-up, B-12    HPI: Patient is a 70 y.o. female seen today for medical management of chronic diseases.    She is getting her B12 injection for B12 deficiency.  Parkinson's:  Has fallen 2x today.  Has a bruise on her left hip and both hands.  Did therapy a long time ago.  Got a cane from [facility].  Cannot walk carrying anything b/c she gets distracted by what she's carrying.  Closet gets cluttered.    Gait instability:  Sides of legs and in the front.  She is having difficulty bearing weight on the left leg when she tries to stand up.  Will have good and bad days when getting up.  About an hour after medication, she walks fast and steady.  Having some severe cramps in the legs and rubs the legs and it goes away.  ?UTI--her psychiatrist, Dr. Milly Jakob, has concerns that she has a UTI.  He has increased her seroquel to 50mg  po bid due to worsening hallucinations. Her husband says she saw two of him and didn't know which one was real. Was just recently done.  She doesn't have increased frequency, urgency or dysuria.  Has had difficulty with incontinence since her bladder ca.  meds aren't helpful.  She gets very depressed.    Continues to c/o digging in in her stomach.  Endometrial thickening was noted on gyn exam.    Down 1 lbs since last time.    Constipation:  Does ok if she eats fiber rich cereal.    5 days per week 8 hours per day home care now.   Past Medical History  Diagnosis Date  . Hiatal hernia   .  Esophageal reflux   . Personal history of other disorders of nervous system and sense organs   . Other and unspecified hyperlipidemia   . Abdominal pain, epigastric   . Flatulence, eructation, and gas pain   . Irritable bowel syndrome   . Pain in joint, site unspecified   . Other abnormal glucose   . Abnormality of gait   . Cervicalgia   . Lumbago   . Nonspecific elevation of levels of transaminase or lactic acid dehydrogenase (LDH)   . Other malaise and fatigue   . Anxiety state, unspecified   . Palpitations   . Other chest pain   . Restless leg syndrome   . Other and unspecified hyperlipidemia   . Parkinson's disease     Dr Tonye Royalty, [facility]  . Detrusor instability   . Bladder cancer ([facility]) 2011    DR. GRAPEY  . PONV (postoperative nausea and vomiting)   . History of recurrent UTIs   . Arthritis   . Lewy body dementia   . Vitamin D deficiency     Past Surgical History  Procedure Laterality Date  . Appendectomy    . Lumbar laminectomy      X 3; last @ age34  . Bunionectomy      left  . Trigger  finger release      X2  . Bladder tumor excision    . Cesarean section    . Chemotherapy      FLUSH-CANCER EXCISED CYSTOSCOPICALLY FOLLOWED BY INSTALLATION OF CHEMOTHERAPY.  . Breast surgery      Biopsy-benign  . Colonoscopy  2011    neg; Dr Sharlett Iles  . Total hip arthroplasty Left 04/12/2012    Procedure: TOTAL HIP ARTHROPLASTY ANTERIOR APPROACH;  Surgeon: Gearlean Alf, MD;  Location: [facility];  Service: Orthopedics;  Laterality: Left;  . Upper gi endoscopy      hiatal hernia    Allergies  Allergen Reactions  . Omeprazole     REACTION: HIVES Because of a history of documented adverse serious drug reaction;Medi Alert bracelet  is recommended  . Simvastatin     REACTION: LEG CRAMPS  . Venlafaxine     REACTION: nausea      Medication List       This list is accurate as of: 06/16/15 11:55 AM.  Always use your most recent med list.               acetaminophen 325 MG  tablet  Commonly known as:  TYLENOL  Take 650 mg by mouth daily as needed.     acetaminophen-codeine 300-30 MG tablet  Commonly known as:  TYLENOL #3  Take 1 tablet by mouth every 8 (eight) hours as needed for moderate pain.     carbidopa-levodopa 25-100 MG tablet  Commonly known as:  SINEMET IR  Take 2 tablets by mouth 4 (four) times daily.     clonazePAM 0.5 MG tablet  Commonly known as:  KLONOPIN  Take 0.5 mg by mouth every evening.     donepezil 5 MG tablet  Commonly known as:  ARICEPT  Take 5 mg by mouth at bedtime. Form memory     pantoprazole 20 MG tablet  Commonly known as:  PROTONIX  TAKE 1 TABLET BY MOUTH EVERY DAY     PARoxetine 20 MG tablet  Commonly known as:  PAXIL  Take 40 mg by mouth every morning.     polyethylene glycol packet  Commonly known as:  MIRALAX / GLYCOLAX  Take 17 g by mouth 2 (two) times daily.     QUEtiapine 50 MG tablet  Commonly known as:  SEROQUEL  Take 50 mg by mouth 2 (two) times daily.     rOPINIRole 8 MG 24 hr tablet  Commonly known as:  REQUIP XL  Take 8 mg by mouth at bedtime.     Vitamin D 2000 units Caps  Take 1 capsule (2,000 Units total) by mouth daily.        Review of Systems:  Review of Systems  Constitutional: Positive for weight loss and malaise/fatigue. Negative for fever and chills.  HENT: Negative for congestion and hearing loss.   Eyes: Negative for blurred vision.  Respiratory: Negative for shortness of breath.   Cardiovascular: Negative for chest pain and leg swelling.  Gastrointestinal: Positive for abdominal pain.       Left lower quadrant stabbing pain occasionally  Genitourinary: Positive for urgency and frequency. Negative for dysuria, hematuria and flank pain.  Musculoskeletal: Positive for myalgias and falls. Negative for joint pain.  Skin: Negative for itching and rash.  Neurological: Positive for tremors and weakness. Negative for dizziness and loss of consciousness.  Endo/Heme/Allergies: Does  not bruise/bleed easily.  Psychiatric/Behavioral: Positive for depression, hallucinations and memory loss.    Health Maintenance  Topic  Date Due  . Hepatitis C Screening  1945-10-12  . INFLUENZA VACCINE  09/07/2015  . PNA vac Low Risk Adult (2 of 2 - PPSV23) 11/23/2015  . MAMMOGRAM  04/27/2017  . TETANUS/TDAP  07/21/2018  . COLONOSCOPY  03/21/2020  . DEXA SCAN  Completed  . ZOSTAVAX  Completed    Physical Exam: Filed Vitals:   06/16/15 1139  BP: 118/62  Pulse: 81  Temp: 98.5 F (36.9 C)  TempSrc: Oral  Height: 5\' 4"  (1.626 m)  Weight: 156 lb (70.761 kg)  SpO2: 97%   Body mass index is 26.76 kg/(m^2). Physical Exam  Constitutional: She appears well-developed and well-nourished. No distress.  Cardiovascular: Normal rate, regular rhythm, normal heart sounds and intact distal pulses.   Pulmonary/Chest: Effort normal and breath sounds normal. No respiratory distress.  Abdominal: Soft. Bowel sounds are normal. She exhibits no distension. There is no tenderness.  Musculoskeletal:  Stooped posture, walking with cane now, slow starting out when she stands  Neurological: She is alert. No cranial nerve deficit. She exhibits abnormal muscle tone.  Tremulous as medication wearing off, short term memory loss getting worse  Skin: Skin is warm and dry.    Labs reviewed: Basic Metabolic Panel:  Recent Labs  04/01/15  NA 141  K 4.3  BUN 16  CREATININE 0.9   Liver Function Tests:  Recent Labs  04/01/15  AST 14  ALT 5*  ALKPHOS 50   No results for input(s): LIPASE, AMYLASE in the last 8760 hours. No results for input(s): AMMONIA in the last 8760 hours. CBC:  Recent Labs  04/01/15  WBC 5.2  HGB 14.2  HCT 42  PLT 252   Lipid Panel:  Recent Labs  04/01/15  CHOL 196  HDL 45  LDLCALC 133  TRIG 153   Lab Results  Component Value Date   HGBA1C 6.0 02/26/2012    Procedures since last visit: US Pelvis Complete  05/26/2015  Ultrasound shows uterus normal size  with several small myomas 20 mm, 13 mm. Endometrial echo with some fluid. Measurement 2.9 mm excluding fluid. Echogenic focus noted within cavity. Right ovary normal. Left ovary with 27 mm mean avascular simple cyst. Cul-de-sac negative. Sonohysterogram performed, sterile technique, single-tooth tenaculum anterior lip stabilization, easy catheter introduction, good distention with 2 small defects consistent with polyps 7 x 6 mm and 13 x 5 mm. Endometrial sample taken with scant return.  [place] Sonohysterogram  05/26/2015  Ultrasound shows uterus normal size with several small myomas 20 mm, 13 mm. Endometrial echo with some fluid. Measurement 2.9 mm excluding fluid. Echogenic focus noted within cavity. Right ovary normal. Left ovary with 27 mm mean avascular simple cyst. Cul-de-sac negative. Sonohysterogram performed, sterile technique, single-tooth tenaculum anterior lip stabilization, easy catheter introduction, good distention with 2 small defects consistent with polyps 7 x 6 mm and 13 x 5 mm. Endometrial sample taken with scant return.   Assessment/Plan 1. B12 deficiency -continue monthly injections of B12 and monitor levels in hopes it will help with leg pains, but these may be purely related to her PD  2. Abdominal pain, left lower quadrant -shooting pain that comes and goes -still having hard stools in pellets, but not compliant with fiber in diet which her husband reports helps with her constipation -findings on gyn examination including biopsies/path were negative for malignancy -no recent malignancy of bladder either--this was historical, but pt perseverates on this significantly  3. 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 PARKINSON'S DISEASE -continue sinemet and requip  5. Lewy body dementia -is worsening over time--biggest problem per family and friend, while her abdominal pain is her own personal biggest problem -continues on aricept and seroquel (just increased to twice a day due to increased hallucinations) -continues to folllow with psychiatry at [facility] and is also on paxil for depression   6. Malignant neoplasm of urinary bladder, unspecified site [facility]) -in the past, followed by urology, has had no evidence of recurrence as of 10/30/14  7. Intramural leiomyoma of uterus -has thickened endometrium per [place] but no bleeding as far as any of [place] know  8. Adnexal cyst -also was noted on her [place] with Dr. Phineas Real  9. Endometrial polyp -two noted on [place]   Re: three gyn findings above, expectant management was chosen due to her PD with Lewy body dementia making her a poor candidate for anesthesia and surgery.  Malignancy was felt to be unlikely per gyn.  [place] to be repeated and following with an endometrial echo.  She is to report any bleeding.  Pathology was unremarkable on 05/19/15 showing inactive endometrium so f/u US in 3-6 mos through gyn.  Has appt 11/10/15.  ? If any of this is causing her abdominal pain (esp cyst on left ovary)  Labs/tests ordered:  none Next appt:  2 mos   Jeffry Vogelsang L. Dian Minahan, D.O. [facility] [address], [place] [address] Cell Phone (Mon-Fri 8am-5pm):  [phone] On Call:  [phone] & follow prompts after 5pm & weekends Office Phone:  [phone] Office Fax:  [phone]

## 2015-06-25 DIAGNOSIS — N390 Urinary tract infection, site not specified: Secondary | ICD-10-CM | POA: Diagnosis not present

## 2015-06-25 DIAGNOSIS — R1032 Left lower quadrant pain: Secondary | ICD-10-CM | POA: Diagnosis not present

## 2015-06-25 DIAGNOSIS — R319 Hematuria, unspecified: Secondary | ICD-10-CM | POA: Diagnosis not present

## 2015-07-21 ENCOUNTER — Encounter (HOSPITAL_COMMUNITY): Payer: Self-pay | Admitting: Emergency Medicine

## 2015-07-21 ENCOUNTER — Encounter: Payer: Self-pay | Admitting: Internal Medicine

## 2015-07-21 ENCOUNTER — Emergency Department (HOSPITAL_COMMUNITY)
Admission: EM | Admit: 2015-07-21 | Discharge: 2015-07-21 | Disposition: A | Discharge status: Home / Self Care | Payer: Medicare Other | Attending: Emergency Medicine | Admitting: Emergency Medicine

## 2015-07-21 ENCOUNTER — Emergency Department (HOSPITAL_COMMUNITY): Payer: Medicare Other

## 2015-07-21 ENCOUNTER — Encounter: Payer: Medicare Other | Admitting: Internal Medicine

## 2015-07-21 ENCOUNTER — Ambulatory Visit (HOSPITAL_BASED_OUTPATIENT_CLINIC_OR_DEPARTMENT_OTHER)
Admission: EM | Admit: 2015-07-21 | Discharge: 2015-07-21 | Disposition: A | Discharge status: Home / Self Care | Payer: Medicare Other | Source: Home / Self Care | Attending: Emergency Medicine | Admitting: Emergency Medicine

## 2015-07-21 DIAGNOSIS — Z96642 Presence of left artificial hip joint: Secondary | ICD-10-CM | POA: Diagnosis not present

## 2015-07-21 DIAGNOSIS — M7121 Synovial cyst of popliteal space [Baker], right knee: Secondary | ICD-10-CM | POA: Insufficient documentation

## 2015-07-21 DIAGNOSIS — M79604 Pain in right leg: Secondary | ICD-10-CM | POA: Diagnosis not present

## 2015-07-21 DIAGNOSIS — G20 Parkinson's disease: Secondary | ICD-10-CM | POA: Insufficient documentation

## 2015-07-21 DIAGNOSIS — M79661 Pain in right lower leg: Secondary | ICD-10-CM | POA: Diagnosis present

## 2015-07-21 DIAGNOSIS — Z8551 Personal history of malignant neoplasm of bladder: Secondary | ICD-10-CM | POA: Diagnosis not present

## 2015-07-21 DIAGNOSIS — M7989 Other specified soft tissue disorders: Secondary | ICD-10-CM | POA: Diagnosis not present

## 2015-07-21 NOTE — Progress Notes (Signed)
*  PRELIMINARY RESULTS* Vascular Ultrasound Right lower extremity venous duplex has been completed.  Preliminary findings: no evidence of DVT. Large ruptured baker's cyst noted, extending from popliteal fossa to mid calf.  Landry Mellow, RDMS, RVT  07/21/2015, 2:58 PM

## 2015-07-21 NOTE — ED Notes (Signed)
Patient transported to X-ray 

## 2015-07-21 NOTE — ED Notes (Signed)
Ultrasound at bedside

## 2015-07-21 NOTE — Discharge Instructions (Signed)
It was our pleasure to provide your ER care today - we hope that you feel better.  Elevate leg. Icepack/cold to sore area.  Take tylenol/motrin as need.   Return to ER if worse, new symptoms, worsening or severe pain, fevers, redness, increased swelling, other concern.      Baker Cyst A Baker cyst is a sac-like structure that forms in the back of the knee. It is filled with the same fluid that is located in your knee. This fluid lubricates the bones and cartilage of the knee and allows them to move over each other more easily. CAUSES  When the knee becomes injured or inflamed, increased fluid forms in the knee. When this happens, the joint lining is pushed out behind the knee and forms the Baker cyst. This cyst may also be caused by inflammation from arthritic conditions and infections. SIGNS AND SYMPTOMS  A Baker cyst usually has no symptoms. When the cyst is substantially enlarged:  You may feel pressure behind the knee, stiffness in the knee, or a mass in the area behind the knee.  You may develop pain, redness, and swelling in the calf. This can suggest a blood clot and requires evaluation by your health care provider. DIAGNOSIS  A Baker cyst is most often found during an ultrasound exam. This exam may have been performed for other reasons, and the cyst was found incidentally. Sometimes an MRI is used. This picks up other problems within a joint that an ultrasound exam may not. If the Baker cyst developed immediately after an injury, X-ray exams may be used to diagnose the cyst. TREATMENT  The treatment depends on the cause of the cyst. Anti-inflammatory medicines and rest often will be prescribed. If the cyst is caused by a bacterial infection, antibiotic medicines may be prescribed.  HOME CARE INSTRUCTIONS   If the cyst was caused by an injury, for the first 24 hours, keep the injured leg elevated on 2 pillows while lying down.  For the first 24 hours while you are awake, apply  ice to the injured area:  Put ice in a plastic bag.  Place a towel between your skin and the bag.  Leave the ice on for 20 minutes, 2-3 times a day.  Only take over-the-counter or prescription medicines for pain, discomfort, or fever as directed by your health care provider.  Only take antibiotic medicine as directed. Make sure to finish it even if you start to feel better. MAKE SURE YOU:   Understand these instructions.  Will watch your condition.  Will get help right away if you are not doing well or get worse.   This information is not intended to replace advice given to you by your health care provider. Make sure you discuss any questions you have with your health care provider.   Document Released: 01/23/2005 Document Revised: 11/13/2012 Document Reviewed: 09/04/2012 Elsevier Interactive Patient Education 2016 [place].    Cryotherapy Cryotherapy means treatment with cold. Ice or gel packs can be used to reduce both pain and swelling. Ice is the most helpful within the first 24 to 48 hours after an injury or flare-up from overusing a muscle or joint. Sprains, strains, spasms, burning pain, shooting pain, and aches can all be eased with ice. Ice can also be used when recovering from surgery. Ice is effective, has very few side effects, and is safe for most people to use. PRECAUTIONS  Ice is not a safe treatment option for people with:  Raynaud phenomenon.  This is a condition affecting small blood vessels in the extremities. Exposure to cold may cause your problems to return.  Cold hypersensitivity. There are many forms of cold hypersensitivity, including:  Cold urticaria. Red, itchy hives appear on the skin when the tissues begin to warm after being iced.  Cold erythema. This is a red, itchy rash caused by exposure to cold.  Cold hemoglobinuria. Red blood cells break down when the tissues begin to warm after being iced. The hemoglobin that carry oxygen are passed into  the urine because they cannot combine with blood proteins fast enough.  Numbness or altered sensitivity in the area being iced. If you have any of the following conditions, do not use ice until you have discussed cryotherapy with your caregiver:  Heart conditions, such as arrhythmia, angina, or chronic heart disease.  High blood pressure.  Healing wounds or open skin in the area being iced.  Current infections.  Rheumatoid arthritis.  Poor circulation.  Diabetes. Ice slows the blood flow in the region it is applied. This is beneficial when trying to stop inflamed tissues from spreading irritating chemicals to surrounding tissues. However, if you expose your skin to cold temperatures for too long or without the proper protection, you can damage your skin or nerves. Watch for signs of skin damage due to cold. HOME CARE INSTRUCTIONS Follow these tips to use ice and cold packs safely.  Place a dry or damp towel between the ice and skin. A damp towel will cool the skin more quickly, so you may need to shorten the time that the ice is used.  For a more rapid response, add gentle compression to the ice.  Ice for no more than 10 to 20 minutes at a time. The bonier the area you are icing, the less time it will take to get the benefits of ice.  Check your skin after 5 minutes to make sure there are no signs of a poor response to cold or skin damage.  Rest 20 minutes or more between uses.  Once your skin is numb, you can end your treatment. You can test numbness by very lightly touching your skin. The touch should be so light that you do not see the skin dimple from the pressure of your fingertip. When using ice, most people will feel these normal sensations in this order: cold, burning, aching, and numbness.  Do not use ice on someone who cannot communicate their responses to pain, such as small children or people with dementia. HOW TO MAKE AN ICE PACK Ice packs are the most common way to  use ice therapy. Other methods include ice massage, ice baths, and cryosprays. Muscle creams that cause a cold, tingly feeling do not offer the same benefits that ice offers and should not be used as a substitute unless recommended by your caregiver. To make an ice pack, do one of the following:  Place crushed ice or a bag of frozen vegetables in a sealable plastic bag. Squeeze out the excess air. Place this bag inside another plastic bag. Slide the bag into a pillowcase or place a damp towel between your skin and the bag.  Mix 3 parts water with 1 part rubbing alcohol. Freeze the mixture in a sealable plastic bag. When you remove the mixture from the freezer, it will be slushy. Squeeze out the excess air. Place this bag inside another plastic bag. Slide the bag into a pillowcase or place a damp towel between your skin and the  bag. SEEK MEDICAL CARE IF:  You develop white spots on your skin. This may give the skin a blotchy (mottled) appearance.  Your skin turns blue or pale.  Your skin becomes waxy or hard.  Your swelling gets worse. MAKE SURE YOU:   Understand these instructions.  Will watch your condition.  Will get help right away if you are not doing well or get worse.   This information is not intended to replace advice given to you by your health care provider. Make sure you discuss any questions you have with your health care provider.   Document Released: 09/19/2010 Document Revised: 02/13/2014 Document Reviewed: 09/19/2010 Elsevier Interactive Patient Education [employer].

## 2015-07-21 NOTE — ED Provider Notes (Addendum)
CSN: [id number]     Arrival date & time 07/21/15  1119 History   First MD Initiated Contact with Patient 07/21/15 1306     Chief Complaint  Patient presents with  . Leg Swelling  . Fall     (Consider location/radiation/quality/duration/timing/severity/associated sxs/prior Treatment) Patient is a 70 y.o. female presenting with fall. The history is provided by the patient.  Fall Pertinent negatives include no chest pain, no abdominal pain, no headaches and no shortness of breath.  Patient with hx Parkinson's, dementia, c/o right lower leg pain and swelling in past few days. Pain mild, constant, dull, worse w palpation and walking.  Patient indicates had a fall 5 days ago, unsure if injured leg or not.  Family indicates patient w dementia, and that they do not recall patient falling or reporting any fall to them.  Patient indicates pcp had wanted check for dvt done.  Patient denies hx dvt. No chest pain or sob. No recent immobility or surgery.  No fever or chills. No numbness/weakness.      Past Medical History  Diagnosis Date  . Hiatal hernia   . Esophageal reflux   . Personal history of other disorders of nervous system and sense organs   . Other and unspecified hyperlipidemia   . Abdominal pain, epigastric   . Flatulence, eructation, and gas pain   . Irritable bowel syndrome   . Pain in joint, site unspecified   . Other abnormal glucose   . Abnormality of gait   . Cervicalgia   . Lumbago   . Nonspecific elevation of levels of transaminase or lactic acid dehydrogenase (LDH)   . Other malaise and fatigue   . Anxiety state, unspecified   . Palpitations   . Other chest pain   . Restless leg syndrome   . Other and unspecified hyperlipidemia   . Parkinson's disease     Dr Tonye Royalty, [facility]  . Detrusor instability   . Bladder cancer ([place]) 2011    DR. GRAPEY  . PONV (postoperative nausea and vomiting)   . History of recurrent UTIs   . Arthritis   . Lewy body dementia   . Vitamin D  deficiency    Past Surgical History  Procedure Laterality Date  . Appendectomy    . Lumbar laminectomy      X 3; last @ age34  . Bunionectomy      left  . Trigger finger release      X2  . Bladder tumor excision    . Cesarean section    . Chemotherapy      FLUSH-CANCER EXCISED CYSTOSCOPICALLY FOLLOWED BY INSTALLATION OF CHEMOTHERAPY.  . Breast surgery      Biopsy-benign  . Colonoscopy  2011    neg; Dr Sharlett Iles  . Total hip arthroplasty Left 04/12/2012    Procedure: TOTAL HIP ARTHROPLASTY ANTERIOR APPROACH;  Surgeon: Gearlean Alf, MD;  Location: [place];  Service: Orthopedics;  Laterality: Left;  . Upper gi endoscopy      hiatal hernia   Family History  Problem Relation Age of Onset  . Lung cancer Father   . Colon cancer Mother   . Hypertension Mother   . Breast cancer Mother 16  . Stomach cancer Maternal Grandmother   . Osteoporosis Maternal Grandmother   . Breast cancer Maternal Aunt 30  . Prostate cancer Paternal Grandfather   . Prostate cancer      Paternal Kandis Fantasia  . Prostate cancer Maternal Grandfather   . Diabetes Neg  Hx   . Stroke Neg Hx   . Heart attack Neg Hx   . Colon polyps Sister    Social History  Substance Use Topics  . Smoking status: Never Smoker   . Smokeless tobacco: Never Used     Comment: social  . Alcohol Use: No   OB History    Gravida Para Term Preterm AB TAB SAB Ectopic Multiple Living   1 1 1   0     1     Review of Systems  Constitutional: Negative for fever and chills.  HENT: Negative for sore throat.   Eyes: Negative for redness.  Respiratory: Negative for shortness of breath.   Cardiovascular: Negative for chest pain.  Gastrointestinal: Negative for abdominal pain.  Genitourinary: Negative for flank pain.  Musculoskeletal: Negative for back pain and neck pain.  Skin: Negative for rash.  Neurological: Negative for headaches.  Hematological: Does not bruise/bleed easily.  Psychiatric/Behavioral: Negative for  confusion.      Allergies  Omeprazole; Simvastatin; and Venlafaxine  Home Medications   Prior to Admission medications   Medication Sig Start Date End Date Taking? Authorizing Provider  acetaminophen (TYLENOL) 325 MG tablet Take 650 mg by mouth every 6 (six) hours as needed for mild pain.    Yes Historical Provider, MD  carbidopa-levodopa (SINEMET IR) 25-100 MG per tablet Take 2 tablets by mouth 4 (four) times daily.    Yes Historical Provider, MD  Cholecalciferol (VITAMIN D) 2000 units CAPS Take 1 capsule (2,000 Units total) by mouth daily. 04/07/15  Yes Tiffany L Reed, DO  clonazePAM (KLONOPIN) 0.5 MG tablet Take 0.5 mg by mouth every evening.   Yes Historical Provider, MD  donepezil (ARICEPT) 5 MG tablet Take 5 mg by mouth at bedtime. Form memory   Yes Historical Provider, MD  pantoprazole (PROTONIX) 20 MG tablet TAKE 1 TABLET BY MOUTH EVERY DAY 05/24/15  Yes Tiffany L Reed, DO  PARoxetine (PAXIL) 20 MG tablet Take 40 mg by mouth every morning.  09/04/12  Yes Historical Provider, MD  QUEtiapine (SEROQUEL) 50 MG tablet Take 100 mg by mouth at bedtime.  05/26/15  Yes Historical Provider, MD  rOPINIRole (REQUIP XL) 8 MG 24 hr tablet Take 8 mg by mouth at bedtime.   Yes Historical Provider, MD   BP 119/75 mmHg  Temp(Src) 97.8 F (36.6 C) (Oral)  Resp 16  Ht 5\' 4"  (1.626 m)  Wt 70.761 kg  BMI 26.76 kg/m2  SpO2 100% Physical Exam  Constitutional: She appears well-developed and well-nourished. No distress.  HENT:  Head: Atraumatic.  Eyes: Conjunctivae are normal. No scleral icterus.  Neck: Neck supple. No tracheal deviation present.  Cardiovascular: Normal rate, regular rhythm, normal heart sounds and intact distal pulses.   Pulmonary/Chest: Effort normal and breath sounds normal. No respiratory distress.  Abdominal: Soft. Normal appearance and bowel sounds are normal. She exhibits no distension. There is no tenderness.  Musculoskeletal:  Mild swelling to left lower leg/calf.  Mild  tenderness to area. ?faint bruising to area. Dp/pt 2+. Compartments of lower leg soft, not tense, and no pain w passive rom at knee or ankle.  No cellulitis.   Neurological: She is alert.  Motor/sensation grossly intact right lower leg and foot.   Skin: Skin is warm and dry. No rash noted. She is not diaphoretic.  Psychiatric: She has a normal mood and affect.  Nursing note and vitals reviewed.   ED Course  Procedures (including critical care time)   Patient Information  Patient Name Sex DOB SSN   Kristina Carr, Kristina Carr Female 11-20-1945 [id number]    Progress Notes by Chapman Fitch at 07/21/2015 2:58 PM    Author: Chapman Fitch Service: Vascular Lab Author Type: Cardiovascular Sonographer   Filed: 07/21/2015 2:58 PM Note Time: 07/21/2015 2:58 PM Status: Signed   Editor: Chapman Fitch (Cardiovascular Sonographer)     Expand All Collapse All   *PRELIMINARY RESULTS* Vascular Ultrasound Right lower extremity venous duplex has been completed. Preliminary findings: no evidence of DVT. Large ruptured baker's cyst noted, extending from popliteal fossa to mid calf.  Landry Mellow, RDMS, RVT 07/21/2015, 2:58 PM        Dg Tibia/fibula Right  07/21/2015  CLINICAL DATA:  Right leg pain and swelling since falling 5 days ago. Initial encounter. EXAM: RIGHT TIBIA AND FIBULA - 2 VIEW COMPARISON:  None. FINDINGS: The mineralization and alignment are normal. On the AP view, there is irregularity of the lateral tibial plateau, suspicious for an acute fracture. This is not well seen on the lateral view. No other acute osseous findings are seen. There is medial compartment joint space loss at the knee. There is diffuse soft tissue edema throughout the lower leg, greatest proximally. IMPRESSION: 1. Suspected acute, mildly displaced intra-articular fracture of the lateral tibial plateau. Dedicated knee radiographs recommended. 2. Nonspecific lower leg subcutaneous edema. Electronically Signed   By:  Richardean Sale M.D.   On: 07/21/2015 14:31      I have personally reviewed and evaluated these images as part of my medical decision-making.    MDM   Imaging studies ordered.  Additional hx from family.  Reviewed nursing notes and prior charts for additional history.   Xray results noted. No pain in knee or tenderness to prox tibia. Patient ambulatory without knee pain.   Clinically, no concern fx to knee/tib plat.    Vascular u/s resulted noted which are clinically c/w patients symptoms.       Lajean Saver, MD 07/21/15 1538

## 2015-07-21 NOTE — ED Notes (Signed)
Pt had a fall on Friday and began having swelling and some discomfort in right leg on Saturday.  Leg is swollen on arrival.

## 2015-08-03 DIAGNOSIS — N390 Urinary tract infection, site not specified: Secondary | ICD-10-CM | POA: Diagnosis not present

## 2015-08-03 DIAGNOSIS — Z87440 Personal history of urinary (tract) infections: Secondary | ICD-10-CM | POA: Diagnosis not present

## 2015-08-03 DIAGNOSIS — R319 Hematuria, unspecified: Secondary | ICD-10-CM | POA: Diagnosis not present

## 2015-08-12 DIAGNOSIS — F0280 Dementia in other diseases classified elsewhere without behavioral disturbance: Secondary | ICD-10-CM | POA: Diagnosis not present

## 2015-08-12 DIAGNOSIS — G3183 Dementia with Lewy bodies: Secondary | ICD-10-CM | POA: Diagnosis not present

## 2015-08-17 ENCOUNTER — Other Ambulatory Visit: Payer: Self-pay | Admitting: Internal Medicine

## 2015-08-23 ENCOUNTER — Encounter: Payer: Self-pay | Admitting: Internal Medicine

## 2015-08-25 ENCOUNTER — Encounter: Payer: Self-pay | Admitting: Internal Medicine

## 2015-08-25 ENCOUNTER — Non-Acute Institutional Stay: Payer: Medicare Other | Admitting: Internal Medicine

## 2015-08-25 VITALS — BP 112/62 | HR 74 | Temp 98.6°F | Wt 154.0 lb

## 2015-08-25 DIAGNOSIS — G20 Parkinson's disease: Secondary | ICD-10-CM

## 2015-08-25 DIAGNOSIS — K592 Neurogenic bowel, not elsewhere classified: Secondary | ICD-10-CM

## 2015-08-25 DIAGNOSIS — F323 Major depressive disorder, single episode, severe with psychotic features: Secondary | ICD-10-CM | POA: Diagnosis not present

## 2015-08-25 DIAGNOSIS — G20A1 Parkinson's disease without dyskinesia, without mention of fluctuations: Secondary | ICD-10-CM

## 2015-08-25 DIAGNOSIS — M7120 Synovial cyst of popliteal space [Baker], unspecified knee: Secondary | ICD-10-CM | POA: Insufficient documentation

## 2015-08-25 DIAGNOSIS — M7121 Synovial cyst of popliteal space [Baker], right knee: Secondary | ICD-10-CM | POA: Diagnosis not present

## 2015-08-25 DIAGNOSIS — G3183 Dementia with Lewy bodies: Secondary | ICD-10-CM

## 2015-08-25 DIAGNOSIS — K5900 Constipation, unspecified: Secondary | ICD-10-CM | POA: Diagnosis not present

## 2015-08-25 DIAGNOSIS — M539 Dorsopathy, unspecified: Secondary | ICD-10-CM | POA: Insufficient documentation

## 2015-08-25 DIAGNOSIS — F0280 Dementia in other diseases classified elsewhere without behavioral disturbance: Secondary | ICD-10-CM

## 2015-08-25 NOTE — Progress Notes (Signed)
Location:   [place] of Service:  Clinic (12)  Provider: Marrio Scribner L. Mariea Clonts, D.O., C.M.D.  Code Status: DNR Goals of Care:  Advanced Directives 08/25/2015  Does patient have an advance directive? Yes  Type of Advance Directive Healthcare Power of Attorney  Copy of advanced directive(s) in chart? -  Would patient like information on creating an advanced directive? -     Chief Complaint  Patient presents with  . Medical Management of Chronic Issues    54mth follow-up    HPI: Patient is a 70 y.o. female seen today for medical management of chronic diseases.    She now has to touch, feel, see everything before her husband now.  Does this with the mail.    She has almost recovered with the swelling of her right leg.  Pian was never that bad.  Right still slightly swollen.  Does have aches of knees persist.    She had one fall on Sunday or Monday in the bathroom.  She was trying to put her blouse on and ended up putting her head through the armhole.  She was trying to walk and bumped into the door.  Fell on carpet.  She stumbled.  Her hypophonia was a problem and he didn't hear her call him.    She stopped the klonopin 2-3 mos ago and that helped with the screaming and yelling at night.    Constipation has been bad.  Took miralax three days ago and it broke things up.  He is pushing her to drink water and the caregiver comes 8 hrs 4 days per week.  Keeping a record of her water intake.  This really helps.    Continues with lower abdominal pain into back.  If lightly touches her back, it sends her through the roof.  It's a stabbing, shooting pain when she is sitting down or someone touches her.  It comes and goes.    Feels bad.  Has no energy, stamina.  Has all of her typical Parkinson's things.   Hallucinations are still bad.  Quetiapine was increased.  She only got up once at night talking--Steve cannot understand her at night.  Sometimes she is sleeping so hard he's not  sure she's living.  She is afraid of the "people coming into her house".    If she will walk in the daytime when not so hot.  Discussed walking in the hallway.    Past Medical History  Diagnosis Date  . Hiatal hernia   . Esophageal reflux   . Personal history of other disorders of nervous system and sense organs   . Other and unspecified hyperlipidemia   . Abdominal pain, epigastric   . Flatulence, eructation, and gas pain   . Irritable bowel syndrome   . Pain in joint, site unspecified   . Other abnormal glucose   . Abnormality of gait   . Cervicalgia   . Lumbago   . Nonspecific elevation of levels of transaminase or lactic acid dehydrogenase (LDH)   . Other malaise and fatigue   . Anxiety state, unspecified   . Palpitations   . Other chest pain   . Restless leg syndrome   . Other and unspecified hyperlipidemia   . Parkinson's disease     Dr Tonye Royalty, [facility]  . Detrusor instability   . Bladder cancer ([place]) 2011    DR. GRAPEY  . PONV (postoperative nausea and vomiting)   . History of recurrent UTIs   .  Arthritis   . Lewy body dementia   . Vitamin D deficiency     Past Surgical History  Procedure Laterality Date  . Appendectomy    . Lumbar laminectomy      X 3; last @ age34  . Bunionectomy      left  . Trigger finger release      X2  . Bladder tumor excision    . Cesarean section    . Chemotherapy      FLUSH-CANCER EXCISED CYSTOSCOPICALLY FOLLOWED BY INSTALLATION OF CHEMOTHERAPY.  . Breast surgery      Biopsy-benign  . Colonoscopy  2011    neg; Dr Sharlett Iles  . Total hip arthroplasty Left 04/12/2012    Procedure: TOTAL HIP ARTHROPLASTY ANTERIOR APPROACH;  Surgeon: Gearlean Alf, MD;  Location: [place];  Service: Orthopedics;  Laterality: Left;  . Upper gi endoscopy      hiatal hernia    Allergies  Allergen Reactions  . Omeprazole     REACTION: HIVES Because of a history of documented adverse serious drug reaction;Medi Alert bracelet  is recommended  .  Simvastatin     REACTION: LEG CRAMPS  . Venlafaxine     REACTION: nausea      Medication List       This list is accurate as of: 08/25/15 10:07 AM.  Always use your most recent med list.               acetaminophen 325 MG tablet  Commonly known as:  TYLENOL  Take 650 mg by mouth every 6 (six) hours as needed for mild pain.     carbidopa-levodopa 25-100 MG tablet  Commonly known as:  SINEMET IR  Take 2 tablets by mouth 4 (four) times daily.     donepezil 10 MG tablet  Commonly known as:  ARICEPT  Take 10 mg by mouth at bedtime.     pantoprazole 20 MG tablet  Commonly known as:  PROTONIX  TAKE 1 TABLET BY MOUTH EVERY DAY     PARoxetine 20 MG tablet  Commonly known as:  PAXIL  Take 40 mg by mouth every morning.     QUEtiapine 50 MG tablet  Commonly known as:  SEROQUEL  Take 100 mg by mouth at bedtime.     rOPINIRole 8 MG 24 hr tablet  Commonly known as:  REQUIP XL  Take 8 mg by mouth at bedtime.     Vitamin D 2000 units Caps  Take 1 capsule (2,000 Units total) by mouth daily.        Review of Systems:  Review of Systems  Constitutional: Positive for weight loss and malaise/fatigue. Negative for fever and chills.  HENT: Negative for congestion and hearing loss.   Eyes: Negative for blurred vision.  Respiratory: Negative for shortness of breath.   Cardiovascular: Positive for leg swelling. Negative for chest pain.       Edema of right leg has improved, still mildly larger   Gastrointestinal: Positive for abdominal pain and constipation. Negative for diarrhea, blood in stool and melena.  Genitourinary: Positive for frequency. Negative for dysuria and urgency.  Musculoskeletal: Positive for falls.  Neurological: Positive for tremors. Negative for dizziness and loss of consciousness.  Endo/Heme/Allergies: Does not bruise/bleed easily.  Psychiatric/Behavioral: Positive for depression, hallucinations and memory loss. Negative for suicidal ideas and substance abuse.  The patient has insomnia. The patient is not nervous/anxious.     Health Maintenance  Topic Date Due  . Hepatitis C Screening  [birth year]  . INFLUENZA VACCINE  09/07/2015  . PNA vac Low Risk Adult (2 of 2 - PPSV23) 11/23/2015  . MAMMOGRAM  04/27/2017  . TETANUS/TDAP  07/21/2018  . COLONOSCOPY  03/21/2020  . DEXA SCAN  Completed  . ZOSTAVAX  Completed    Physical Exam: Filed Vitals:   08/25/15 0939  BP: 112/62  Pulse: 74  Temp: 98.6 F (37 C)  TempSrc: Oral  Weight: 154 lb (69.854 kg)  SpO2: 98%   Body mass index is 26.42 kg/(m^2). Physical Exam  Constitutional: She appears well-developed and well-nourished. No distress.  HENT:  Head: Normocephalic and atraumatic.  Cardiovascular: Normal rate, regular rhythm, normal heart sounds and intact distal pulses.   Pulmonary/Chest: Effort normal and breath sounds normal. No respiratory distress.  Abdominal: Soft. Bowel sounds are normal.  Musculoskeletal: She exhibits no tenderness.  Mild edema of right greater than left leg  Neurological: She is alert. She exhibits abnormal muscle tone.  Walking with cane; chronic tremors resting  Skin: Skin is warm and dry.  Psychiatric:  Tearful at times, frustrated about circumstances    Labs reviewed: Basic Metabolic Panel:  Recent Labs  04/01/15  NA 141  K 4.3  BUN 16  CREATININE 0.9   Liver Function Tests:  Recent Labs  04/01/15  AST 14  ALT 5*  ALKPHOS 50   No results for input(s): LIPASE, AMYLASE in the last 8760 hours. No results for input(s): AMMONIA in the last 8760 hours. CBC:  Recent Labs  04/01/15  WBC 5.2  HGB 14.2  HCT 42  PLT 252   Lipid Panel:  Recent Labs  04/01/15  CHOL 196  HDL 45  LDLCALC 133  TRIG 153   Lab Results  Component Value Date   HGBA1C 6.0 02/26/2012    Procedures since last visit: 07/21/15:  xrays right tib/fib:  1. Suspected acute, mildly displaced intra-articular fracture of the lateral tibial plateau. Dedicated knee  radiographs recommended. 2. Nonspecific lower leg subcutaneous edema. -ED diagnosed with Baker's cyst right NOT fx.  Assessment/Plan 1. PARKINSON'S DISEASE -is continuing to progress, remains ambulatory, but falls frequently; using cane, but often getting around without it -cont current meds as per her neurologist at [facility]  2. Lewy body dementia without behavioral disturbance -is on increased dose of seroquel and off klonopin -continue this as hallucinations are worse and people in the home are frightening to her   3. 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 Baker's cyst, right -pain resolved, swelling improved, cont to monitor; no dvt and possible fx felt not to be real on imaging  5. Severe single current episode of major depressive disorder, with psychotic features ([place]) -ongoing, I'm hesitant to add any antidepressants to her regimen due to her PD which limits choices and she's had some problems with tolerability in the past -also some of her depression is PD itself -abdominal pain seems to be a fixation of hers--has some chronic anxiety about having a bowel obstruction b/c her mom died of colon ca with obstruction or that her bladder ca is back -did not do well with klonopin  Labs/tests ordered:  No new Next appt:  11/24/2015 for med mgt and pneumovax  Cung Masterson L. Mercer Stallworth, D.O. Geriatrics  [facility] [address], [place] [address] Cell Phone (Mon-Fri 8am-5pm):  [phone] On Call:  [phone] & follow prompts after 5pm & weekends Office Phone:  [phone] Office Fax:  [phone]

## 2015-09-09 DIAGNOSIS — G3183 Dementia with Lewy bodies: Secondary | ICD-10-CM | POA: Diagnosis not present

## 2015-09-09 DIAGNOSIS — G20 Parkinson's disease: Secondary | ICD-10-CM | POA: Diagnosis not present

## 2015-09-10 DIAGNOSIS — Z8551 Personal history of malignant neoplasm of bladder: Secondary | ICD-10-CM | POA: Diagnosis not present

## 2015-09-10 DIAGNOSIS — N3946 Mixed incontinence: Secondary | ICD-10-CM | POA: Diagnosis not present

## 2015-11-10 ENCOUNTER — Ambulatory Visit (INDEPENDENT_AMBULATORY_CARE_PROVIDER_SITE_OTHER): Payer: Medicare Other | Admitting: Gynecology

## 2015-11-10 ENCOUNTER — Ambulatory Visit (INDEPENDENT_AMBULATORY_CARE_PROVIDER_SITE_OTHER): Payer: Medicare Other

## 2015-11-10 ENCOUNTER — Other Ambulatory Visit: Payer: Self-pay | Admitting: Gynecology

## 2015-11-10 ENCOUNTER — Encounter: Payer: Self-pay | Admitting: Gynecology

## 2015-11-10 VITALS — BP 122/78

## 2015-11-10 DIAGNOSIS — D251 Intramural leiomyoma of uterus: Secondary | ICD-10-CM

## 2015-11-10 DIAGNOSIS — R938 Abnormal findings on diagnostic imaging of other specified body structures: Secondary | ICD-10-CM | POA: Diagnosis not present

## 2015-11-10 DIAGNOSIS — N83202 Unspecified ovarian cyst, left side: Secondary | ICD-10-CM

## 2015-11-10 DIAGNOSIS — R9389 Abnormal findings on diagnostic imaging of other specified body structures: Secondary | ICD-10-CM

## 2015-11-10 DIAGNOSIS — N840 Polyp of corpus uteri: Secondary | ICD-10-CM

## 2015-11-10 NOTE — Progress Notes (Signed)
    Kristina Carr Dec 21, 1945 [id number]        70 y.o.  G1P1001 presents for follow up ultrasound with her friend. She had an ultrasound of the left lower quadrant for pain that have been going on for several months which showed a thicker endometrial echo at 6.2 mm. She was referred in April with this history. No history of bleeding or other gynecologic complaints. Sonohysterogram at that time showed a normal uterus with several small myomas the largest measuring 20 mm. Endometrial echo was 2.9 mm with fluid. An echogenic focus was noted. Right ovary was normal. Left ovary had a 27 mm avascular simple cyst. After fluid introduction to small endometrial defects were noted 7 x 6 mm and 13 x 5 mm. Endometrial sample showed atrophic endometrium. Options for management were reviewed given the patient's significant comorbidity of Parkinson's with reported significant surgical risk and the patient elected for observation with follow up ultrasound and returns for this now.  Past medical history,surgical history, problem list, medications, allergies, family history and social history were all reviewed and documented in the EPIC chart.  Directed ROS with pertinent positives and negatives documented in the history of present illness/assessment and plan.  Exam: Vitals:   11/10/15 1138  BP: 122/78   General appearance:  Normal  Ultrasound shows uterus normal size and echo texture. Small 23 mm myoma noted. Endometrial echo 6.5 mm with thin line of fluid surrounding an echogenic area a 14 x 4 mm. Right ovary normal. Left ovary with thin-walled avascular 32 mm mean cyst. Cul-de-sac negative  Assessment/Plan:  70 y.o. G1P1001 with incidental finding of thicker endometrial echo at 6 mm. Sonohysterogram shows 2 small probable polyps. Sampling should atrophic endometrium. Follow up ultrasound now although endometrial echo appears to measure little wider the echogenic focus measures approximately the same at 14  mm, previously 13 mm. Left ovary with persistent small avascular cyst without significant increase since last measurement. I again reviewed with the patient and her friend cannot guarantee histology in the absence of removing the polyps but certainly does not appear that's been a significant change in the size. The patient understands the issues of missed malignancy diagnoses but again confirms that she would be at significant anesthesia risk due to her Parkinson's given the total picture she does not want to pursue intervention at this time. I discussed with her the issue of follow up studies and whether we would take a look in 6 months for stability versus no further studies given that she probably would not act upon changing appearance. Patient's going to think more about this. I did ask her to call me if she does any bleeding and to call if she wants to pursue a follow up ultrasound in 6 months. Again if she would never pursue any intervention then the issue as to whether it is worth re-looking at this area and she is going to think about that.    Anastasio Auerbach MD, 12:11 PM 11/10/2015

## 2015-11-10 NOTE — Patient Instructions (Signed)
Follow up if you want to repeat the ultrasound in 6 months. Call me if you do any vaginal bleeding regardless.

## 2015-11-24 ENCOUNTER — Encounter: Payer: Self-pay | Admitting: Internal Medicine

## 2015-11-24 ENCOUNTER — Non-Acute Institutional Stay: Payer: Medicare Other | Admitting: Internal Medicine

## 2015-11-24 VITALS — BP 112/60 | HR 70 | Temp 98.6°F | Ht 64.0 in | Wt 156.0 lb

## 2015-11-24 DIAGNOSIS — G3183 Dementia with Lewy bodies: Secondary | ICD-10-CM

## 2015-11-24 DIAGNOSIS — Z23 Encounter for immunization: Secondary | ICD-10-CM | POA: Diagnosis not present

## 2015-11-24 DIAGNOSIS — G20 Parkinson's disease: Secondary | ICD-10-CM

## 2015-11-24 DIAGNOSIS — R9389 Abnormal findings on diagnostic imaging of other specified body structures: Secondary | ICD-10-CM | POA: Insufficient documentation

## 2015-11-24 DIAGNOSIS — M1611 Unilateral primary osteoarthritis, right hip: Secondary | ICD-10-CM | POA: Diagnosis not present

## 2015-11-24 DIAGNOSIS — F0280 Dementia in other diseases classified elsewhere without behavioral disturbance: Secondary | ICD-10-CM

## 2015-11-24 DIAGNOSIS — R938 Abnormal findings on diagnostic imaging of other specified body structures: Secondary | ICD-10-CM

## 2015-11-24 DIAGNOSIS — K5900 Constipation, unspecified: Secondary | ICD-10-CM

## 2015-11-24 DIAGNOSIS — G20A1 Parkinson's disease without dyskinesia, without mention of fluctuations: Secondary | ICD-10-CM

## 2015-11-24 DIAGNOSIS — K592 Neurogenic bowel, not elsewhere classified: Secondary | ICD-10-CM

## 2015-11-24 NOTE — Patient Instructions (Addendum)
Please get your flu shot through the pharmacy on Thursday, 10/26 (in newsletter).    Cont to drink more water and eat fruit and veggies to keep your bowels moving.  Also continue walking and participating in the exercise classes.    Use tylenol es at bedtime to keep the hip pain from bothering you overnight.  Take no more than 3000mg  of tylenol per day (6 extra strength tablets).

## 2015-11-24 NOTE — Progress Notes (Signed)
Location:  Occupational psychologist of Service:  Clinic (12)  Provider: Ilze Roselli L. Mariea Clonts, D.O., C.M.D.  Code Status: DNR Goals of Care:  Advanced Directives 11/24/2015  Does patient have an advance directive? Yes  Type of Advance Directive [place]  Does patient want to make changes to advanced directive? -  Copy of advanced directive(s) in chart? Yes  Would patient like information on creating an advanced directive? -  Pre-existing out of facility DNR order (yellow form or pink MOST form) -     Chief Complaint  Patient presents with  . Follow-up    3 mth     HPI: Patient is a 70 y.o. female seen today for medical management of chronic diseases.    Needs her pneumonia vaccine today (pneumovax).  Says her constipation is the worst thing.  She is eating more fruits and veggies and drinking more water which has helped.  Has an 8 hr caregiver 4 days per week.  She makes sure she hydrates.  Then she is incontinent at night.  She gets very little notice that she must urinate now.    Has tried all of the pills.    Bladder ca--had f/u in August which was clear for cancer (previous dx in 2/16).    Saw Dr. Phineas Real re: thickening of endometrial stripe and atrophic endometrium.  She is to have f/u US in 6 mos and to call him if she has any vaginal bleeding.    Still having her back pain and into her groin.  Feels like dagger shoved into her groin area on the right.  It interferes with her sleep.  Has used the tylenol with codeine rarely--1-2 times.  Usually uses tylenol extra strength.  It was from 5/19 and is basically full from Dr. Wynelle Link.  She is doing the chair exercises two days a week.  She has to be very careful.   Has lost her cane she was using all of the time.    Has not been having the hallucinations recently--Dr. Milly Jakob has her under Lewy body dementia.      Past Medical History:  Diagnosis Date  . Abdominal pain, epigastric   .  Abnormality of gait   . Anxiety state, unspecified   . Arthritis   . Bladder cancer ([place]) 2011   DR. GRAPEY  . Cervicalgia   . Detrusor instability   . Esophageal reflux   . Flatulence, eructation, and gas pain   . Hiatal hernia   . History of recurrent UTIs   . Irritable bowel syndrome   . Lewy body dementia   . Lumbago   . Nonspecific elevation of levels of transaminase or lactic acid dehydrogenase (LDH)   . Other abnormal glucose   . Other and unspecified hyperlipidemia   . Other and unspecified hyperlipidemia   . Other chest pain   . Other malaise and fatigue   . Pain in joint, site unspecified   . Palpitations   . Parkinson's disease    Dr Tonye Royalty, [facility]  . Personal history of other disorders of nervous system and sense organs   . PONV (postoperative nausea and vomiting)   . Restless leg syndrome   . Vitamin D deficiency     Past Surgical History:  Procedure Laterality Date  . APPENDECTOMY    . BLADDER TUMOR EXCISION    . BREAST SURGERY     Biopsy-benign  . BUNIONECTOMY     left  . CESAREAN  SECTION    . CHEMOTHERAPY     FLUSH-CANCER EXCISED CYSTOSCOPICALLY FOLLOWED BY INSTALLATION OF CHEMOTHERAPY.  . COLONOSCOPY  2011   neg; Dr Sharlett Iles  . LUMBAR LAMINECTOMY     X 3; last @ age34  . TOTAL HIP ARTHROPLASTY Left 04/12/2012   Procedure: TOTAL HIP ARTHROPLASTY ANTERIOR APPROACH;  Surgeon: Gearlean Alf, MD;  Location: [place];  Service: Orthopedics;  Laterality: Left;  . TRIGGER FINGER RELEASE     X2  . UPPER GI ENDOSCOPY     hiatal hernia    Allergies  Allergen Reactions  . Omeprazole     REACTION: HIVES Because of a history of documented adverse serious drug reaction;Medi Alert bracelet  is recommended  . Simvastatin     REACTION: LEG CRAMPS  . Venlafaxine     REACTION: nausea      Medication List       Accurate as of 11/24/15  9:31 AM. Always use your most recent med list.          acetaminophen-codeine 300-30 MG tablet Commonly known as:   TYLENOL #3 Take 1 tablet by mouth every 8 (eight) hours as needed for moderate pain.   carbidopa-levodopa 25-100 MG tablet Commonly known as:  SINEMET IR Take 2 tablets by mouth 4 (four) times daily.   donepezil 10 MG tablet Commonly known as:  ARICEPT Take 10 mg by mouth at bedtime.   pantoprazole 20 MG tablet Commonly known as:  PROTONIX TAKE 1 TABLET BY MOUTH EVERY DAY   PARoxetine 20 MG tablet Commonly known as:  PAXIL Take 40 mg by mouth every morning.   QUEtiapine 50 MG tablet Commonly known as:  SEROQUEL Take 100 mg by mouth at bedtime.   rOPINIRole 4 MG 24 hr tablet Commonly known as:  REQUIP XL Take 4 mg by mouth.   Vitamin D 2000 units Caps Take 1 capsule (2,000 Units total) by mouth daily.       Review of Systems:  Review of Systems  Constitutional: Positive for malaise/fatigue. Negative for chills and fever.  Eyes: Negative for blurred vision.  Respiratory: Negative for cough and shortness of breath.   Cardiovascular: Negative for chest pain, palpitations and leg swelling.  Gastrointestinal: Positive for constipation. Negative for abdominal pain, blood in stool, diarrhea and melena.  Genitourinary: Negative for dysuria.  Musculoskeletal: Positive for back pain and joint pain.       Right groin  Skin: Negative for itching and rash.  Neurological: Positive for tremors. Negative for dizziness, loss of consciousness and weakness.  Psychiatric/Behavioral: Positive for depression and memory loss. Negative for hallucinations. The patient has insomnia.        Difficulty sleeping with hip pain    Health Maintenance  Topic Date Due  . Hepatitis C Screening  July 19, 1945  . INFLUENZA VACCINE  09/07/2015  . PNA vac Low Risk Adult (2 of 2 - PPSV23) 11/23/2015  . MAMMOGRAM  04/27/2017  . TETANUS/TDAP  07/21/2018  . COLONOSCOPY  03/21/2020  . DEXA SCAN  Completed  . ZOSTAVAX  Completed    Physical Exam: Vitals:   11/24/15 0910  BP: 112/60  Pulse: 70  Temp:  98.6 F (37 C)  TempSrc: Oral  SpO2: 93%  Weight: 156 lb (70.8 kg)  Height: 5\' 4"  (1.626 m)   Body mass index is 26.78 kg/m. Physical Exam  Constitutional: She appears well-developed and well-nourished. No distress.  Cardiovascular: Normal rate, regular rhythm, normal heart sounds and intact distal pulses.  Pulmonary/Chest: Effort normal and breath sounds normal. No respiratory distress.  Abdominal: Soft. Bowel sounds are normal.  Musculoskeletal: Normal range of motion. She exhibits no tenderness or deformity.  Tenderness in right groin  Neurological: She is alert. She exhibits abnormal muscle tone. Coordination abnormal.  Oriented to person, place, not time; some difficulty with word finding and will use incorrect words sometimes  Skin: Skin is warm and dry. Capillary refill takes less than 2 seconds.  Psychiatric: She has a normal mood and affect.    Labs reviewed: Basic Metabolic Panel:  Recent Labs  04/01/15  NA 141  K 4.3  BUN 16  CREATININE 0.9   Liver Function Tests:  Recent Labs  04/01/15  AST 14  ALT 5*  ALKPHOS 50   No results for input(s): LIPASE, AMYLASE in the last 8760 hours. No results for input(s): AMMONIA in the last 8760 hours. CBC:  Recent Labs  04/01/15  WBC 5.2  HGB 14.2  HCT 42  PLT 252   Lipid Panel:  Recent Labs  04/01/15  CHOL 196  HDL 45  LDLCALC 133  TRIG 153   Lab Results  Component Value Date   HGBA1C 6.0 02/26/2012    Procedures since last visit: US Transvaginal Non-ob  Result Date: 11/10/2015 Ultrasound shows uterus normal size and echo texture. Small 23 mm myoma noted. Endometrial echo 6.5 mm with thin line of fluid surrounding an echogenic area a 14 x 4 mm. Right ovary normal. Left ovary with thin-walled avascular 32 mm mean cyst. Cul-de-sac negative   Assessment/Plan 1. Arthritis of right hip -new complaint (has historically c/o her back and abdomen only) -suspect this is worsening arthritis there also (had  left hip replaced--rode horses for many years)  2. PARKINSON'S DISEASE -no changes in her PD recently -reminded of importance of using her cane at all times (has lost her favorite)  3. Lewy body dementia without behavioral disturbance -better recently with fewer hallucinations and actually seemed a little clearer with cognition today than the last several visits -follows with geri psych at [facility] for this  4. 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 Endometrial thickening on ultra sound -keep f/u US and visit with gyn as planned and notify them of any vaginal bleeding if it occurs  6. Need for prophylactic vaccination against Streptococcus pneumoniae (pneumococcus) -pneumovax given  They plan to get flu shots 10/26 at the [facility] at [facility]  Labs/tests ordered:  No new Next appt:  3 mos med mgt, then March for [place]. Finnley Lewis, D.O. [facility] [address], [place] [address] Cell Phone (Mon-Fri 8am-5pm):  [phone] On Call:  [phone] & follow prompts after 5pm & weekends Office Phone:  [phone] Office Fax:  [phone]

## 2015-11-24 NOTE — Addendum Note (Signed)
Addended by: Despina Hidden on: 11/24/2015 10:58 AM   Modules accepted: Orders

## 2015-12-22 DIAGNOSIS — Z23 Encounter for immunization: Secondary | ICD-10-CM | POA: Diagnosis not present

## 2016-02-23 ENCOUNTER — Other Ambulatory Visit: Payer: Self-pay | Admitting: Internal Medicine

## 2016-02-23 ENCOUNTER — Encounter: Payer: Self-pay | Admitting: Internal Medicine

## 2016-03-15 ENCOUNTER — Encounter: Payer: Self-pay | Admitting: Internal Medicine

## 2016-03-15 ENCOUNTER — Non-Acute Institutional Stay: Payer: Medicare Other | Admitting: Internal Medicine

## 2016-03-15 VITALS — BP 100/60 | HR 85 | Temp 98.9°F | Wt 159.0 lb

## 2016-03-15 DIAGNOSIS — M545 Low back pain, unspecified: Secondary | ICD-10-CM

## 2016-03-15 DIAGNOSIS — G20 Parkinson's disease: Secondary | ICD-10-CM

## 2016-03-15 DIAGNOSIS — G8929 Other chronic pain: Secondary | ICD-10-CM | POA: Diagnosis not present

## 2016-03-15 DIAGNOSIS — F0280 Dementia in other diseases classified elsewhere without behavioral disturbance: Secondary | ICD-10-CM

## 2016-03-15 DIAGNOSIS — K5900 Constipation, unspecified: Secondary | ICD-10-CM

## 2016-03-15 DIAGNOSIS — G3183 Dementia with Lewy bodies: Secondary | ICD-10-CM

## 2016-03-15 DIAGNOSIS — R7309 Other abnormal glucose: Secondary | ICD-10-CM | POA: Diagnosis not present

## 2016-03-15 DIAGNOSIS — E7800 Pure hypercholesterolemia, unspecified: Secondary | ICD-10-CM | POA: Diagnosis not present

## 2016-03-15 DIAGNOSIS — K592 Neurogenic bowel, not elsewhere classified: Secondary | ICD-10-CM

## 2016-03-15 DIAGNOSIS — G20A1 Parkinson's disease without dyskinesia, without mention of fluctuations: Secondary | ICD-10-CM

## 2016-03-15 MED ORDER — ACETAMINOPHEN 500 MG PO TABS: 500.0000 mg | ORAL_TABLET | Freq: Three times a day (TID) | ORAL | 3 refills | Status: DC | PRN

## 2016-03-15 NOTE — Progress Notes (Signed)
Location:  Occupational psychologist of Service:  Clinic (12)  Provider: Aava Deland L. Mariea Clonts, D.O., C.M.D.  Code Status: DNR Goals of Care:  Advanced Directives 03/15/2016  Does Patient Have a Medical Advance Directive? Yes  Type of Advance Directive [place]  Does patient want to make changes to medical advance directive? -  Copy of [place] in Chart? Yes  Would patient like information on creating a medical advance directive? -  Pre-existing out of facility DNR order (yellow form or pink MOST form) -   Chief Complaint  Patient presents with  . Medical Management of Chronic Issues    34mth follow-up    HPI: Patient is a 72 y.o. female seen today for medical management of chronic diseases.    Lewy body dementia Parkinson's Sees a lot more people in the house that are not there.  She thinks it's better than it was, but Richardson Landry disagrees.  The people are just there.  They want to eat and she tries to feed them, but outside of that, not much.  She is very convinced of their presence. seroquel was increased last time this happened and ropinirole was reduced.  Time, when things happen, and what day, are huge problems.     Walking better lately.  She has fallen some.  She sleeps across the bed or on the edge and then falls.  She is having more freezing lately.    Weight ok--up 4 lbs from October.  She is walking less for exercise due to her back pain.  The cold weather and rain has prevented this also.  Memory continues to deteriorate.   Back pain continues to be a problem.  She is using tylenol ES for it.   Ice helps her back she reports.    Past Medical History:  Diagnosis Date  . Abdominal pain, epigastric   . Abnormality of gait   . Anxiety state, unspecified   . Arthritis   . Bladder cancer ([place]) 2011   DR. GRAPEY  . Cervicalgia   . Detrusor instability   . Esophageal reflux   . Flatulence, eructation, and gas pain   .  Hiatal hernia   . History of recurrent UTIs   . Irritable bowel syndrome   . Lewy body dementia   . Lumbago   . Nonspecific elevation of levels of transaminase or lactic acid dehydrogenase (LDH)   . Other abnormal glucose   . Other and unspecified hyperlipidemia   . Other and unspecified hyperlipidemia   . Other chest pain   . Other malaise and fatigue   . Pain in joint, site unspecified   . Palpitations   . Parkinson's disease    Dr Tonye Royalty, [facility]  . Personal history of other disorders of nervous system and sense organs   . PONV (postoperative nausea and vomiting)   . Restless leg syndrome   . Vitamin D deficiency     Past Surgical History:  Procedure Laterality Date  . APPENDECTOMY    . BLADDER TUMOR EXCISION    . BREAST SURGERY     Biopsy-benign  . BUNIONECTOMY     left  . CESAREAN SECTION    . CHEMOTHERAPY     FLUSH-CANCER EXCISED CYSTOSCOPICALLY FOLLOWED BY INSTALLATION OF CHEMOTHERAPY.  . COLONOSCOPY  2011   neg; Dr Sharlett Iles  . LUMBAR LAMINECTOMY     X 3; last @ age34  . TOTAL HIP ARTHROPLASTY Left 04/12/2012   Procedure:  TOTAL HIP ARTHROPLASTY ANTERIOR APPROACH;  Surgeon: Gearlean Alf, MD;  Location: [place];  Service: Orthopedics;  Laterality: Left;  . TRIGGER FINGER RELEASE     X2  . UPPER GI ENDOSCOPY     hiatal hernia    Allergies  Allergen Reactions  . Omeprazole     REACTION: HIVES Because of a history of documented adverse serious drug reaction;Medi Alert bracelet  is recommended  . Simvastatin     REACTION: LEG CRAMPS  . Venlafaxine     REACTION: nausea    Allergies as of 03/15/2016      Reactions   Omeprazole    REACTION: HIVES Because of a history of documented adverse serious drug reaction;Medi Alert bracelet  is recommended   Simvastatin    REACTION: LEG CRAMPS   Venlafaxine    REACTION: nausea      Medication List       Accurate as of 03/15/16  9:50 AM. Always use your most recent med list.          acetaminophen-codeine 300-30 MG  tablet Commonly known as:  TYLENOL #3 Take 1 tablet by mouth every 8 (eight) hours as needed for moderate pain.   carbidopa-levodopa 25-100 MG tablet Commonly known as:  SINEMET IR Take 2 tablets by mouth 4 (four) times daily.   donepezil 10 MG tablet Commonly known as:  ARICEPT Take 10 mg by mouth at bedtime.   pantoprazole 20 MG tablet Commonly known as:  PROTONIX TAKE 1 TABLET BY MOUTH EVERY DAY   PARoxetine 20 MG tablet Commonly known as:  PAXIL Take 40 mg by mouth every morning.   QUEtiapine 50 MG tablet Commonly known as:  SEROQUEL Take 100 mg by mouth at bedtime.   rOPINIRole 4 MG 24 hr tablet Commonly known as:  REQUIP XL Take 4 mg by mouth.   Vitamin D 2000 units Caps Take 1 capsule (2,000 Units total) by mouth daily.       Review of Systems:  Review of Systems  Constitutional: Negative for chills, fever and malaise/fatigue.  HENT: Negative for congestion.   Eyes: Negative for blurred vision.  Respiratory: Negative for shortness of breath.   Cardiovascular: Negative for chest pain and palpitations.  Gastrointestinal: Negative for abdominal pain, blood in stool, constipation and melena.  Genitourinary: Positive for frequency and urgency. Negative for dysuria.       Depends worn now  Musculoskeletal: Positive for back pain and falls. Negative for myalgias.  Skin: Negative for itching and rash.  Neurological: Positive for tremors. Negative for dizziness, loss of consciousness and weakness.       More festination and freezing now  Endo/Heme/Allergies: Does not bruise/bleed easily.  Psychiatric/Behavioral: Positive for depression, hallucinations and memory loss. Negative for suicidal ideas. The patient is not nervous/anxious and does not have insomnia.     Health Maintenance  Topic Date Due  . Hepatitis C Screening  1945-05-20  . INFLUENZA VACCINE  09/07/2015  . MAMMOGRAM  04/27/2017  . TETANUS/TDAP  07/21/2018  . COLONOSCOPY  03/21/2020  . DEXA SCAN   Completed  . ZOSTAVAX  Completed  . PNA vac Low Risk Adult  Completed    Physical Exam: Vitals:   03/15/16 0930  BP: 100/60  Pulse: 85  Temp: 98.9 F (37.2 C)  TempSrc: Oral  SpO2: 94%  Weight: 159 lb (72.1 kg)   Body mass index is 27.29 kg/m. Physical Exam  Constitutional: She appears well-developed. No distress.  Cardiovascular: Normal rate, regular  rhythm, normal heart sounds and intact distal pulses.   Pulmonary/Chest: Effort normal and breath sounds normal. No respiratory distress.  Abdominal: Soft. Bowel sounds are normal.  Musculoskeletal: She exhibits no tenderness.  Festinating gait today, is walking a lot faster overall; she remains unsteady, did not bring cane today  Neurological: She is alert. She exhibits abnormal muscle tone. Coordination abnormal.  Oriented to person, place, not time; increased evidence of short term memory loss today--forgetting midsentence   Skin: Skin is warm and dry.    Labs reviewed: Basic Metabolic Panel:  Recent Labs  04/01/15  NA 141  K 4.3  BUN 16  CREATININE 0.9   Liver Function Tests:  Recent Labs  04/01/15  AST 14  ALT 5*  ALKPHOS 50   No results for input(s): LIPASE, AMYLASE in the last 8760 hours. No results for input(s): AMMONIA in the last 8760 hours. CBC:  Recent Labs  04/01/15  WBC 5.2  HGB 14.2  HCT 42  PLT 252   Lipid Panel:  Recent Labs  04/01/15  CHOL 196  HDL 45  LDLCALC 133  TRIG 153   Lab Results  Component Value Date   HGBA1C 6.0 02/26/2012   Assessment/Plan 1. Lewy body dementia without behavioral disturbance -is worsening with more hallucinations of people in the home per [name] -she has an appt tomorrow with her psychiatry office at [facility] current seroquel--people are not scary but it sounds like it causes distress b/w her and her husband -unfortunately, nuplazid is not approved for lewy body (I wonder if it would help???)  2. PARKINSON'S DISEASE -continue sinemet,  ropinirole through neurology  3. Chronic midline low back pain without sciatica -cont use of tylenol--advised that she can take a maximum of 3000mg  per day (sounds like she only takes 1500mg )  4. 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 Pure hypercholesterolemia -f/u flp, not on meds for this   6. HYPERGLYCEMIA, FASTING -f/u cmp  -increase walking for exercise  Labs/tests ordered:  Cbc, cmp, lipid before Next appt:  4 mos Kristina Carr, D.O. [facility] [address], [place] [address] Cell Phone (Mon-Fri 8am-5pm):  [phone] On Call:  [phone] & follow prompts after 5pm & weekends Office Phone:  [phone] Office Fax:  [phone]

## 2016-03-16 DIAGNOSIS — R4189 Other symptoms and signs involving cognitive functions and awareness: Secondary | ICD-10-CM | POA: Diagnosis not present

## 2016-03-16 DIAGNOSIS — E538 Deficiency of other specified B group vitamins: Secondary | ICD-10-CM | POA: Diagnosis not present

## 2016-03-16 DIAGNOSIS — F329 Major depressive disorder, single episode, unspecified: Secondary | ICD-10-CM | POA: Diagnosis not present

## 2016-03-16 DIAGNOSIS — R443 Hallucinations, unspecified: Secondary | ICD-10-CM | POA: Diagnosis not present

## 2016-03-16 DIAGNOSIS — G20 Parkinson's disease: Secondary | ICD-10-CM | POA: Diagnosis not present

## 2016-03-16 DIAGNOSIS — R5382 Chronic fatigue, unspecified: Secondary | ICD-10-CM | POA: Diagnosis not present

## 2016-03-16 DIAGNOSIS — G3183 Dementia with Lewy bodies: Secondary | ICD-10-CM | POA: Diagnosis not present

## 2016-03-16 DIAGNOSIS — Z79899 Other long term (current) drug therapy: Secondary | ICD-10-CM | POA: Diagnosis not present

## 2016-03-16 DIAGNOSIS — G2581 Restless legs syndrome: Secondary | ICD-10-CM | POA: Diagnosis not present

## 2016-07-04 ENCOUNTER — Encounter: Payer: Self-pay | Admitting: Internal Medicine

## 2016-07-04 DIAGNOSIS — E785 Hyperlipidemia, unspecified: Secondary | ICD-10-CM | POA: Diagnosis not present

## 2016-07-04 DIAGNOSIS — I10 Essential (primary) hypertension: Secondary | ICD-10-CM | POA: Diagnosis not present

## 2016-07-04 DIAGNOSIS — Z87440 Personal history of urinary (tract) infections: Secondary | ICD-10-CM | POA: Diagnosis not present

## 2016-07-04 DIAGNOSIS — G3183 Dementia with Lewy bodies: Secondary | ICD-10-CM | POA: Diagnosis not present

## 2016-07-04 LAB — BASIC METABOLIC PANEL
BUN: 18 mg/dL (ref 4–21)
Creatinine: 0.9 mg/dL (ref 0.5–1.1)
Glucose: 96 mg/dL
Potassium: 4.3 mmol/L (ref 3.4–5.3)
Sodium: 142 mmol/L (ref 137–147)

## 2016-07-04 LAB — CBC AND DIFFERENTIAL
HCT: 44 % (ref 36–46)
Hemoglobin: 14.2 g/dL (ref 12.0–16.0)
Platelets: 297 10*3/uL (ref 150–399)
WBC: 6.5 10^3/mL

## 2016-07-04 LAB — LIPID PANEL
Cholesterol: 193 mg/dL (ref 0–200)
HDL: 43 mg/dL (ref 35–70)
LDL Cholesterol: 116 mg/dL
Triglycerides: 171 mg/dL — AB (ref 40–160)

## 2016-07-04 LAB — HEPATIC FUNCTION PANEL
ALT: 15 U/L (ref 7–35)
AST: 10 U/L — AB (ref 13–35)
Alkaline Phosphatase: 60 U/L (ref 25–125)
Bilirubin, Total: 0.2 mg/dL

## 2016-07-12 ENCOUNTER — Encounter: Payer: Self-pay | Admitting: Internal Medicine

## 2016-07-12 ENCOUNTER — Non-Acute Institutional Stay: Payer: Medicare Other | Admitting: Internal Medicine

## 2016-07-12 VITALS — BP 112/70 | HR 65 | Temp 97.9°F | Ht 64.0 in | Wt 151.0 lb

## 2016-07-12 DIAGNOSIS — G3183 Dementia with Lewy bodies: Secondary | ICD-10-CM | POA: Diagnosis not present

## 2016-07-12 DIAGNOSIS — G8929 Other chronic pain: Secondary | ICD-10-CM

## 2016-07-12 DIAGNOSIS — M25512 Pain in left shoulder: Secondary | ICD-10-CM | POA: Diagnosis not present

## 2016-07-12 DIAGNOSIS — C679 Malignant neoplasm of bladder, unspecified: Secondary | ICD-10-CM

## 2016-07-12 DIAGNOSIS — F0280 Dementia in other diseases classified elsewhere without behavioral disturbance: Secondary | ICD-10-CM | POA: Diagnosis not present

## 2016-07-12 DIAGNOSIS — M545 Low back pain, unspecified: Secondary | ICD-10-CM

## 2016-07-12 DIAGNOSIS — K5900 Constipation, unspecified: Secondary | ICD-10-CM

## 2016-07-12 DIAGNOSIS — G20 Parkinson's disease: Secondary | ICD-10-CM | POA: Diagnosis not present

## 2016-07-12 DIAGNOSIS — K592 Neurogenic bowel, not elsewhere classified: Secondary | ICD-10-CM

## 2016-07-12 DIAGNOSIS — G20A1 Parkinson's disease without dyskinesia, without mention of fluctuations: Secondary | ICD-10-CM

## 2016-07-12 NOTE — Progress Notes (Signed)
Location:  Occupational psychologist of Service:  Clinic (12)  Provider: Zunairah Devers L. Mariea Clonts, D.O., C.M.D.  Code Status: DNR Goals of Care:  Advanced Directives 07/12/2016  Does Patient Have a Medical Advance Directive? Yes  Type of Advance Directive [place]  Does patient want to make changes to medical advance directive? -  Copy of [place] in Chart? Yes  Would patient like information on creating a medical advance directive? -  Pre-existing out of facility DNR order (yellow form or pink MOST form) -   Chief Complaint  Patient presents with  . Medical Management of Chronic Issues    74mth follow-up    HPI: Patient is a 71 y.o. female seen today for medical management of chronic diseases.    3 falls recently.  2 falls back to back last week.  She loses track of where she's going and has thoughts ahead of movement, then falls.  Says she's able to right herself if she touches something.  Her husband has caught her a couple of times.  She will use the cane, but not constantly.  Puts it down, but doesn't pick it back up.  She does seem to like to walk--has a festinating gait.  Her friends were unable to keep up with her.    Left shoulder is very painful when when move sit across her body.  They were doing the chair exercises.  Then it was d/cd due to decreased attendance.  Now the program is too advanced.  Cannot keep up cognitively and cannot do it for 20 mins straight.  Cannot do gardening now due to her back pain.  8 hrs Mon/Tues/Thurs caregiving, Wed close friend comes and spends time with [name] 10 to end of day--they do whatever she wants to do, Richardson Landry does fri/sat/sun himself.    Still has her constipation issues and abdominal pain.  Taking miralax.    Has overactive bladder issue also.  No changes with it.  Richardson Landry is going to make her an appt b/c in 5 year window for bladder cancer f/u.  She does not trust that it's not back.     Dementia is a little worse.  Hallucinations were getting worse and worse so she is off ropinirole which did help.   Has lost 8 lbs since last visit.    PD meds make her nauseous, weak and "not feel good".   Past Medical History:  Diagnosis Date  . Abdominal pain, epigastric   . Abnormality of gait   . Anxiety state, unspecified   . Arthritis   . Bladder cancer ([place]) 2011   DR. GRAPEY  . Cervicalgia   . Detrusor instability   . Esophageal reflux   . Flatulence, eructation, and gas pain   . Hiatal hernia   . History of recurrent UTIs   . Irritable bowel syndrome   . Lewy body dementia   . Lumbago   . Nonspecific elevation of levels of transaminase or lactic acid dehydrogenase (LDH)   . Other abnormal glucose   . Other and unspecified hyperlipidemia   . Other and unspecified hyperlipidemia   . Other chest pain   . Other malaise and fatigue   . Pain in joint, site unspecified   . Palpitations   . Parkinson's disease    Dr Tonye Royalty, [facility]  . Personal history of other disorders of nervous system and sense organs   . PONV (postoperative nausea and vomiting)   .  Restless leg syndrome   . Vitamin D deficiency     Past Surgical History:  Procedure Laterality Date  . APPENDECTOMY    . BLADDER TUMOR EXCISION    . BREAST SURGERY     Biopsy-benign  . BUNIONECTOMY     left  . CESAREAN SECTION    . CHEMOTHERAPY     FLUSH-CANCER EXCISED CYSTOSCOPICALLY FOLLOWED BY INSTALLATION OF CHEMOTHERAPY.  . COLONOSCOPY  2011   neg; Dr Sharlett Iles  . LUMBAR LAMINECTOMY     X 3; last @ age34  . TOTAL HIP ARTHROPLASTY Left 04/12/2012   Procedure: TOTAL HIP ARTHROPLASTY ANTERIOR APPROACH;  Surgeon: Gearlean Alf, MD;  Location: [facility];  Service: Orthopedics;  Laterality: Left;  . TRIGGER FINGER RELEASE     X2  . UPPER GI ENDOSCOPY     hiatal hernia    Allergies  Allergen Reactions  . Omeprazole     REACTION: HIVES Because of a history of documented adverse serious drug reaction;Medi  Alert bracelet  is recommended  . Simvastatin     REACTION: LEG CRAMPS  . Venlafaxine     REACTION: nausea    Allergies as of 07/12/2016      Reactions   Omeprazole    REACTION: HIVES Because of a history of documented adverse serious drug reaction;Medi Alert bracelet  is recommended   Simvastatin    REACTION: LEG CRAMPS   Venlafaxine    REACTION: nausea      Medication List       Accurate as of 07/12/16  8:43 AM. Always use your most recent med list.          acetaminophen 500 MG tablet Commonly known as:  TYLENOL Take 1 tablet (500 mg total) by mouth every 8 (eight) hours as needed.   carbidopa-levodopa 25-100 MG tablet Commonly known as:  SINEMET IR Take 2 tablets by mouth 4 (four) times daily.   donepezil 10 MG tablet Commonly known as:  ARICEPT Take 10 mg by mouth at bedtime.   pantoprazole 20 MG tablet Commonly known as:  PROTONIX TAKE 1 TABLET BY MOUTH EVERY DAY   PARoxetine 20 MG tablet Commonly known as:  PAXIL Take 40 mg by mouth every morning.   QUEtiapine 50 MG tablet Commonly known as:  SEROQUEL Take 100 mg by mouth at bedtime.   Vitamin D 2000 units Caps Take 1 capsule (2,000 Units total) by mouth daily.       Review of Systems:  Review of Systems  Constitutional: Negative for chills, fever and malaise/fatigue.  HENT: Negative for hearing loss.   Eyes: Negative for blurred vision.  Respiratory: Negative for shortness of breath.   Cardiovascular: Negative for chest pain, palpitations and leg swelling.  Gastrointestinal: Positive for constipation. Negative for abdominal pain, blood in stool and melena.       Incontinent overnight, occasionally in day  Genitourinary: Positive for frequency and urgency. Negative for dysuria.  Musculoskeletal: Positive for back pain, falls and joint pain.       Left shoulder  Skin: Negative for itching and rash.  Neurological: Positive for tremors and weakness. Negative for dizziness, sensory change, speech  change and loss of consciousness.  Endo/Heme/Allergies: Does not bruise/bleed easily.  Psychiatric/Behavioral: Positive for depression, hallucinations and memory loss. The patient is not nervous/anxious and does not have insomnia.     Health Maintenance  Topic Date Due  . Hepatitis C Screening  09/21/1945  . INFLUENZA VACCINE  09/06/2016  . MAMMOGRAM  04/27/2017  . TETANUS/TDAP  07/21/2018  . COLONOSCOPY  03/21/2020  . DEXA SCAN  Completed  . PNA vac Low Risk Adult  Completed    Physical Exam: Vitals:   07/12/16 0837  BP: 112/70  Pulse: 65  Temp: 97.9 F (36.6 C)  TempSrc: Oral  SpO2: 97%  Weight: 151 lb (68.5 kg)  Height: 5\' 4"  (1.626 m)   Body mass index is 25.92 kg/m. Physical Exam  Constitutional: She appears well-developed and well-nourished. No distress.  Cardiovascular: Normal rate, regular rhythm, normal heart sounds and intact distal pulses.   Pulmonary/Chest: Effort normal and breath sounds normal. No respiratory distress.  Abdominal: Bowel sounds are normal.  Musculoskeletal: Normal range of motion. She exhibits tenderness.  Left shoulder   Neurological: She is alert. She exhibits abnormal muscle tone.  Oriented to person, place, has insight into disease; festinating gait  Skin: Skin is warm and dry. Capillary refill takes less than 2 seconds.  Psychiatric:  Does joke about her condition   Labs reviewed: Basic Metabolic Panel:  Recent Labs  07/04/16 0800  NA 142  K 4.3  BUN 18  CREATININE 0.9   Liver Function Tests:  Recent Labs  07/04/16 0800  AST 10*  ALT 15  ALKPHOS 60   No results for input(s): LIPASE, AMYLASE in the last 8760 hours. No results for input(s): AMMONIA in the last 8760 hours. CBC:  Recent Labs  07/04/16 0800  WBC 6.5  HGB 14.2  HCT 44  PLT 297   Lipid Panel:  Recent Labs  07/04/16 0800  CHOL 193  HDL 43  LDLCALC 116  TRIG 171*   Lab Results  Component Value Date   HGBA1C 6.0 02/26/2012     Assessment/Plan 1. Lewy body dementia without behavioral disturbance -continue caregiver support, current regimen per geri psych  2. PARKINSON'S DISEASE -cont current sinemet regimen, now off requip due to hallucinations  3. Malignant neoplasm of urinary bladder, unspecified site [facility]) -still in 5 yr period for potential recurrence--will f/u with her urologist on this  4. 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 Acute pain of left shoulder -new, seems this is from her exercise program -refer to PT/OT for this as well as her progressive festinating gait, more frequent falls  6. Chronic midline low back pain without sciatica -ongoing, interferes with her activity, uses tylenol for pain when bad enough  Labs/tests ordered:  No new, just had and reviewed with her today Next appt:  3 mos med mgt  Jaeline Whobrey L. Jeryl Umholtz, D.O. [facility] [address], [place] [address] Cell Phone (Mon-Fri 8am-5pm):  [phone] On Call:  [phone] & follow prompts after 5pm & weekends Office Phone:  [phone] Office Fax:  [phone]

## 2016-08-10 ENCOUNTER — Other Ambulatory Visit: Payer: Self-pay | Admitting: Internal Medicine

## 2016-09-14 DIAGNOSIS — Z79899 Other long term (current) drug therapy: Secondary | ICD-10-CM | POA: Diagnosis not present

## 2016-09-14 DIAGNOSIS — G20 Parkinson's disease: Secondary | ICD-10-CM | POA: Diagnosis not present

## 2016-09-14 DIAGNOSIS — F0281 Dementia in other diseases classified elsewhere with behavioral disturbance: Secondary | ICD-10-CM | POA: Diagnosis not present

## 2016-09-14 DIAGNOSIS — G3183 Dementia with Lewy bodies: Secondary | ICD-10-CM | POA: Diagnosis not present

## 2016-10-16 DIAGNOSIS — N3281 Overactive bladder: Secondary | ICD-10-CM | POA: Diagnosis not present

## 2016-10-16 DIAGNOSIS — N3941 Urge incontinence: Secondary | ICD-10-CM | POA: Diagnosis not present

## 2016-10-16 DIAGNOSIS — Z8551 Personal history of malignant neoplasm of bladder: Secondary | ICD-10-CM | POA: Diagnosis not present

## 2016-10-17 IMAGING — US US ABDOMEN COMPLETE
1 series · 14 of 25 positions shown · non-contrast
Comparison: CT abdomen and pelvis 04/13/2010

CLINICAL DATA: Left lower quadrant abdominal pain for 3 months.
History of dementia and bladder cancer.

EXAM:
ABDOMEN ULTRASOUND COMPLETE

[Series 1: us abdomen complete · 0.28mm/px · 14 of 83 slices shown]
[im 1/83]
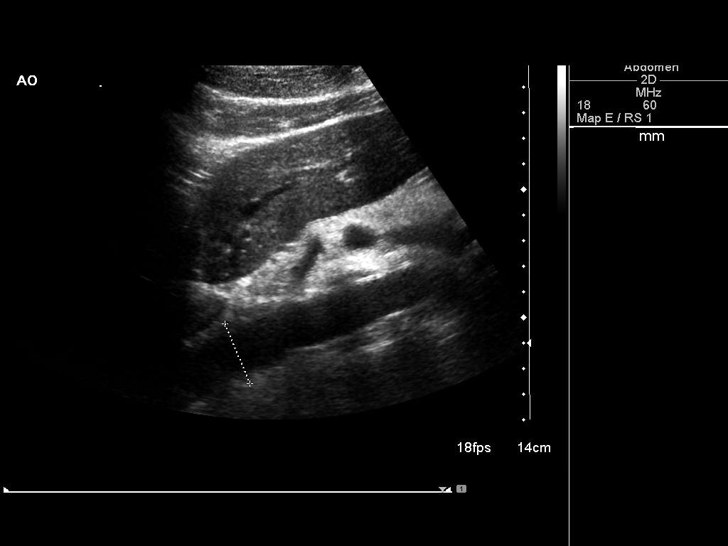
[im 7/83]
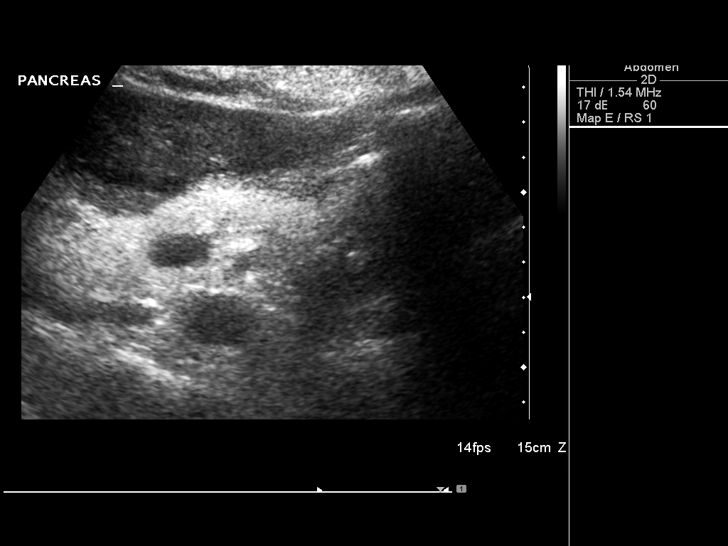
[im 14/83]
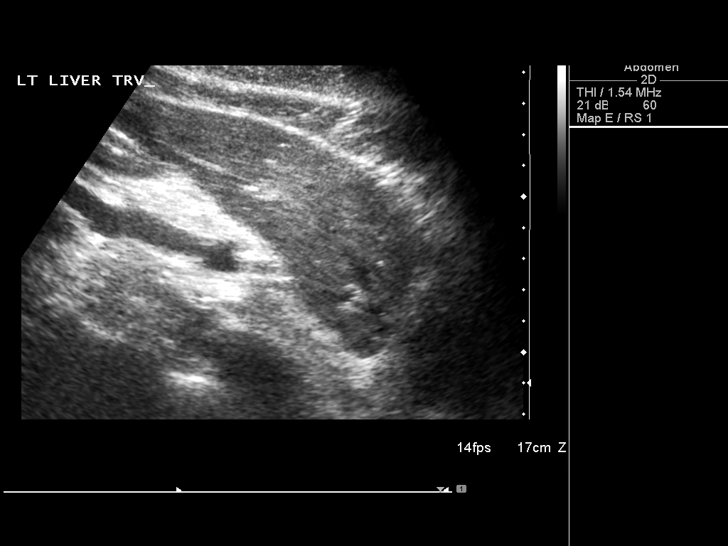
[im 21/83]
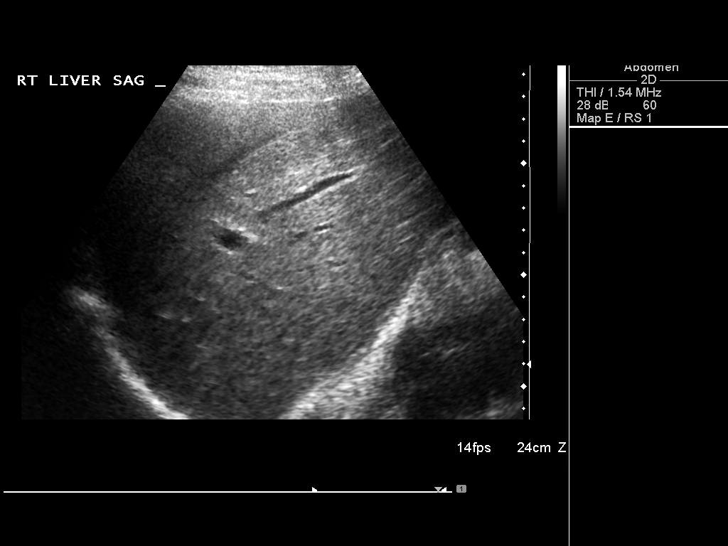
[im 28/83]
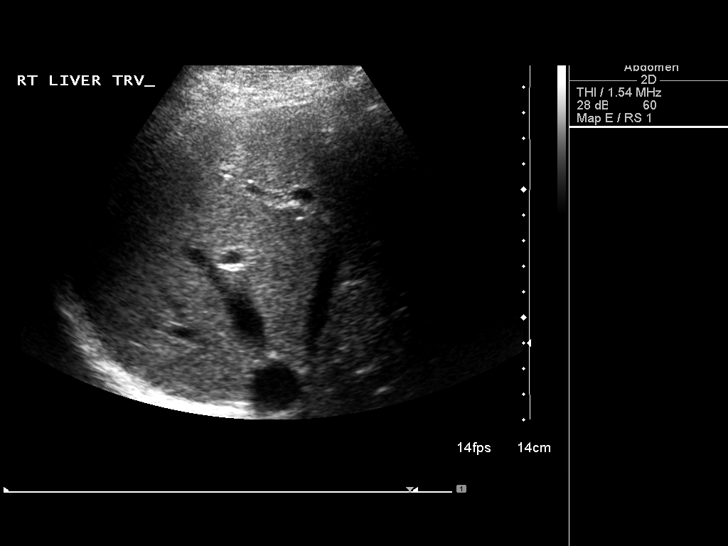
[im 31/83]
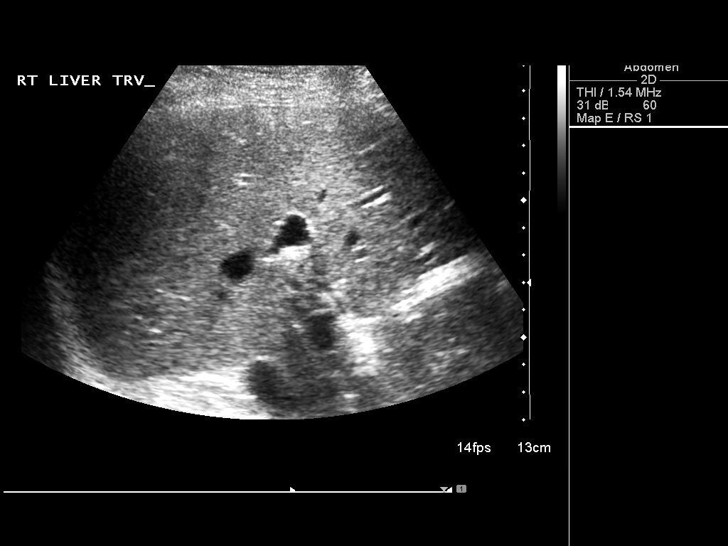
[im 38/83]
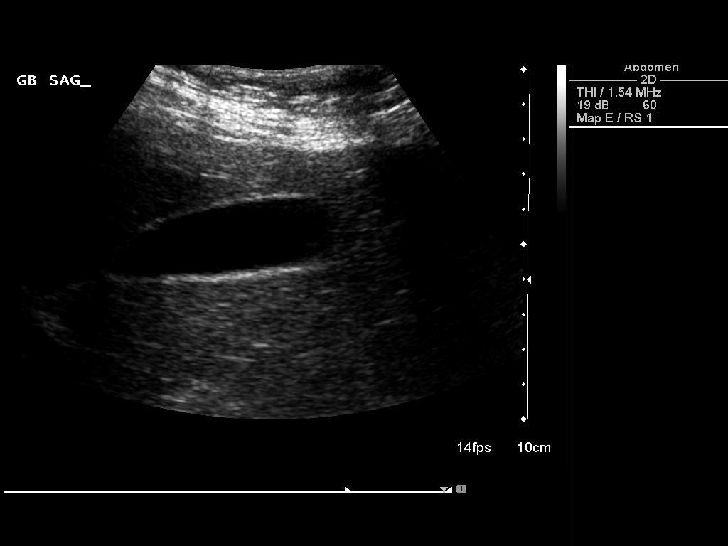
[im 45/83]
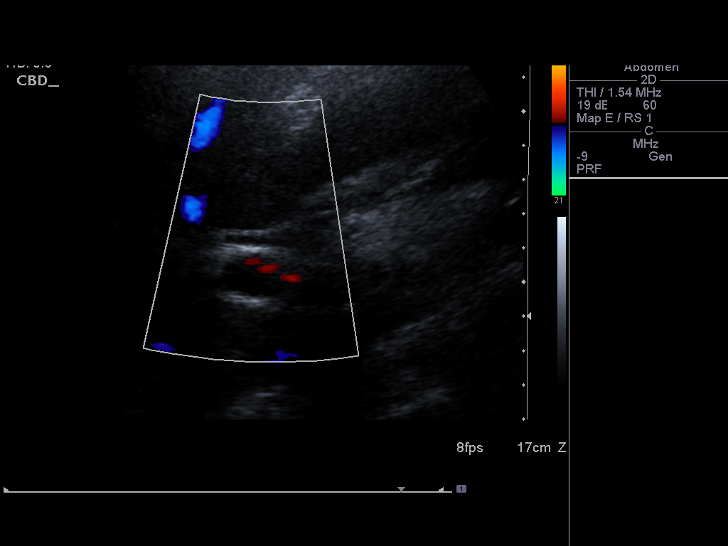
[im 52/83]
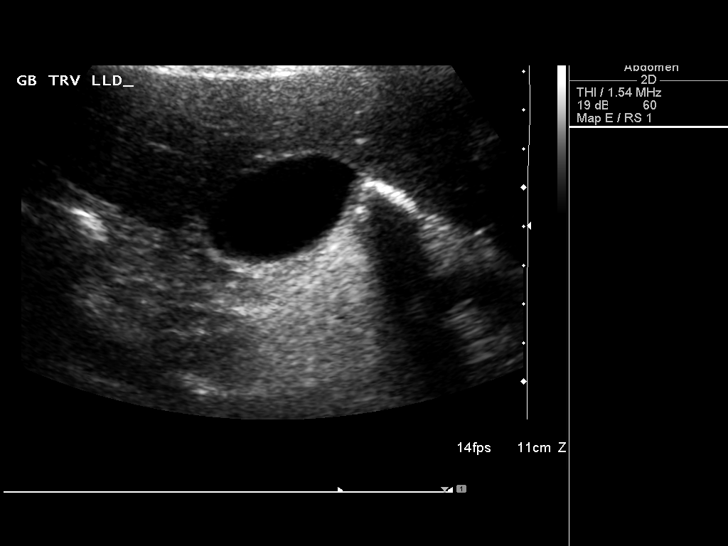
[im 55/83]
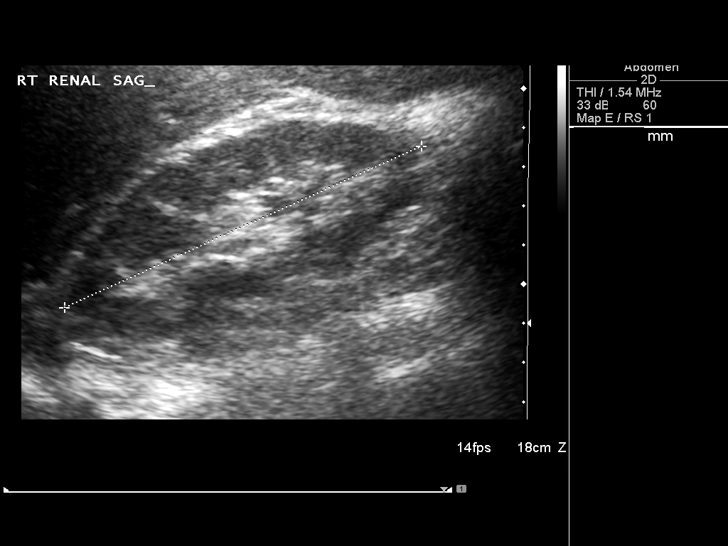
[im 62/83]
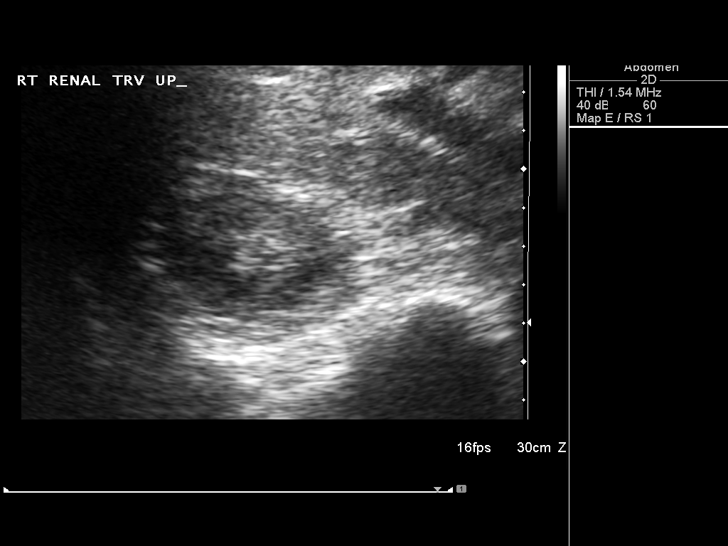
[im 69/83]
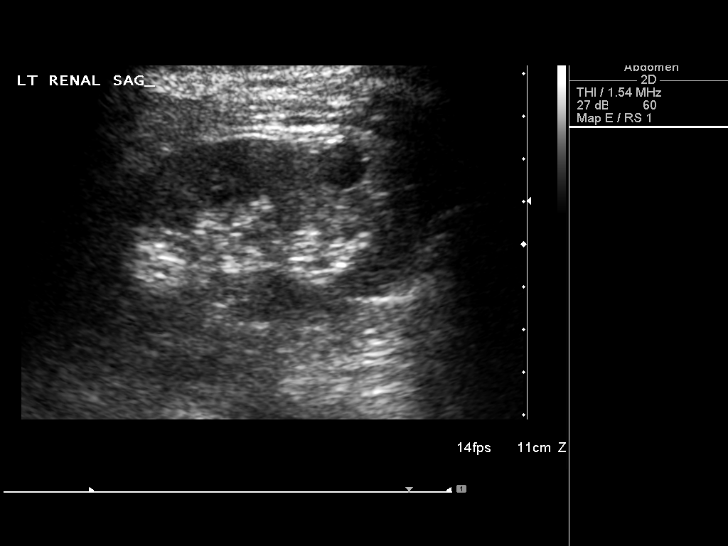
[im 76/83]
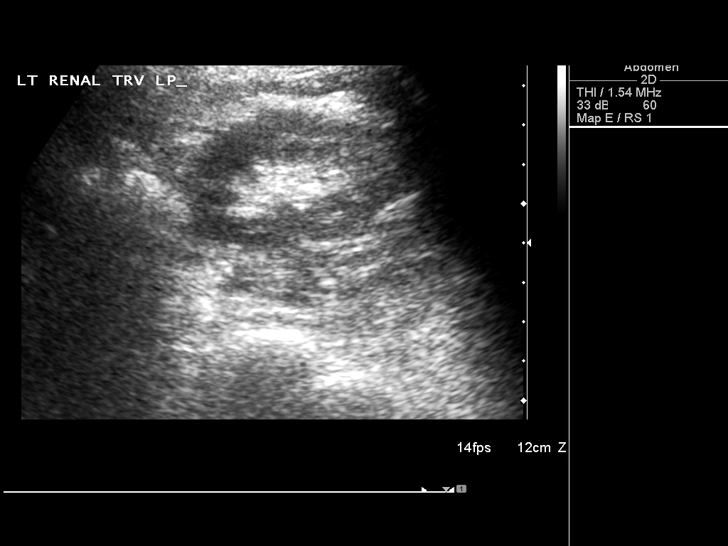
[im 83/83]
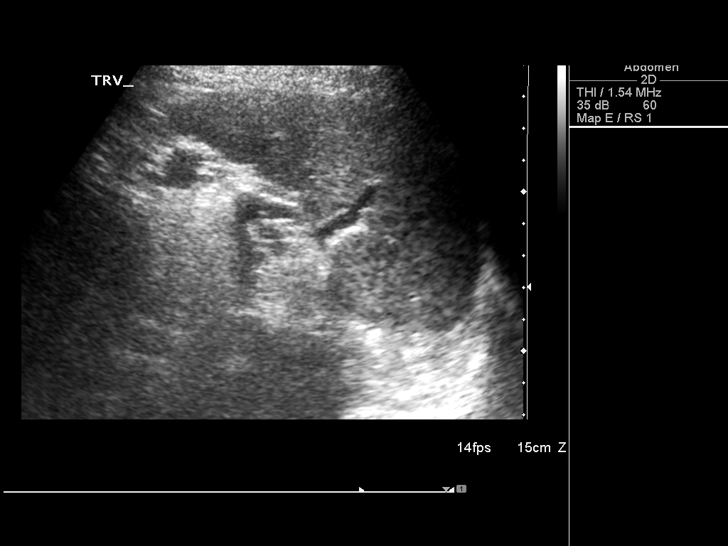

[14 of 25 positions shown; findings below may reference images not displayed]

FINDINGS: Gallbladder: No gallstones or wall thickening visualized. No
sonographic Murphy sign noted by sonographer.

Common bile duct: Diameter: 4 mm

Liver: No focal lesion identified. Within normal limits in
parenchymal echogenicity.

IVC: No abnormality visualized.

Pancreas: Visualized portion unremarkable.

Spleen: Size and appearance within normal limits.

Right Kidney: Length: 10.4 cm. Echogenicity within normal limits. No
mass or hydronephrosis visualized.

Left Kidney: Length: 10.7 cm. Echogenicity within normal limits. No
solid mass or hydronephrosis visualized. 10 x 9 x 10 mm lower pole
cyst, not significantly changed from the prior CT.

Abdominal aorta: No aneurysm visualized.

Other findings: None.
IMPRESSION: Unremarkable abdominal ultrasound aside from a 10 mm left renal
cyst.

## 2016-10-17 IMAGING — US US PELVIS COMPLETE
1 series · 13 of 25 positions shown · non-contrast
Comparison: Ultrasound December 07, 2009.

CLINICAL DATA: Left lower quadrant abdominal pain for 3 months.

EXAM:
TRANSABDOMINAL AND TRANSVAGINAL ULTRASOUND OF PELVIS
TECHNIQUE: Both transabdominal and transvaginal ultrasound examinations of the
pelvis were performed. Transabdominal technique was performed for
global imaging of the pelvis including uterus, ovaries, adnexal
regions, and pelvic cul-de-sac. It was necessary to proceed with
endovaginal exam following the transabdominal exam to visualize the
ovaries and uterus.

[Series 1: us pelvis complete · 0.30mm/px · 13 of 61 slices shown]
[im 1/61]
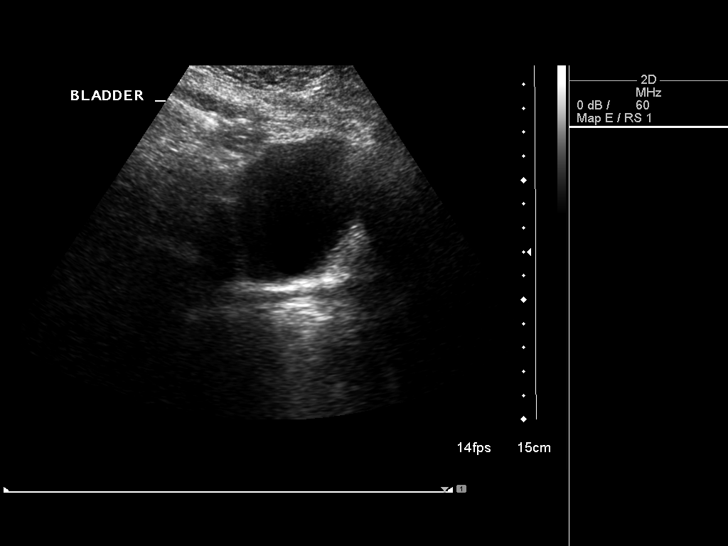
[im 6/61]
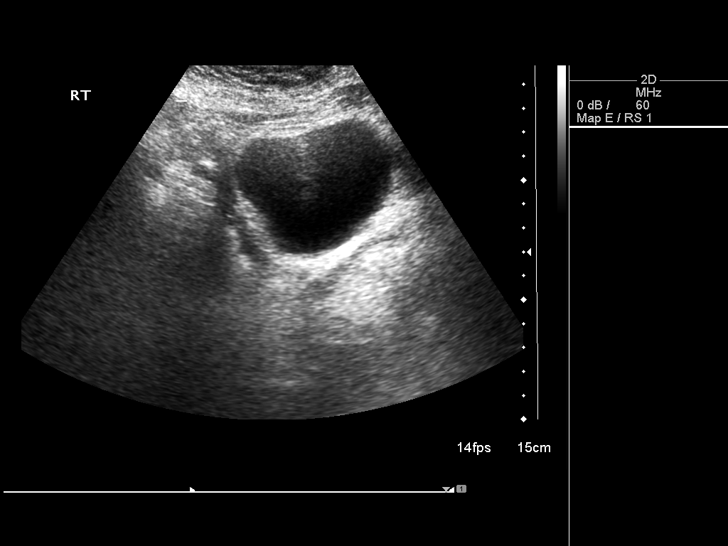
[im 11/61]
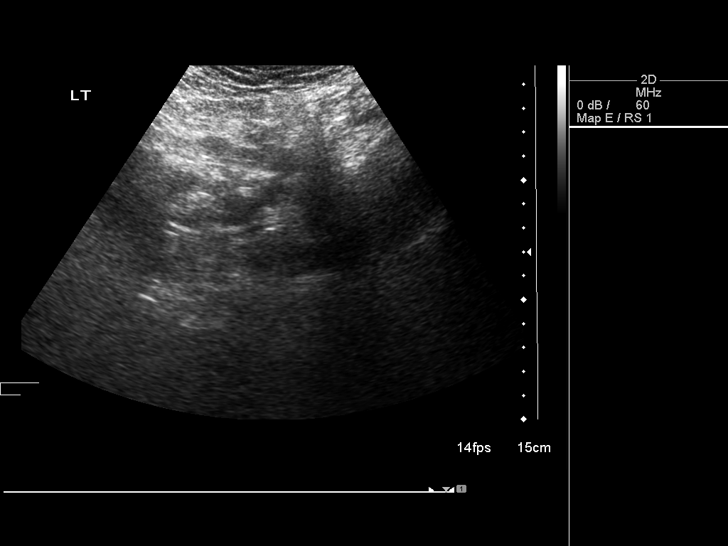
[im 16/61]
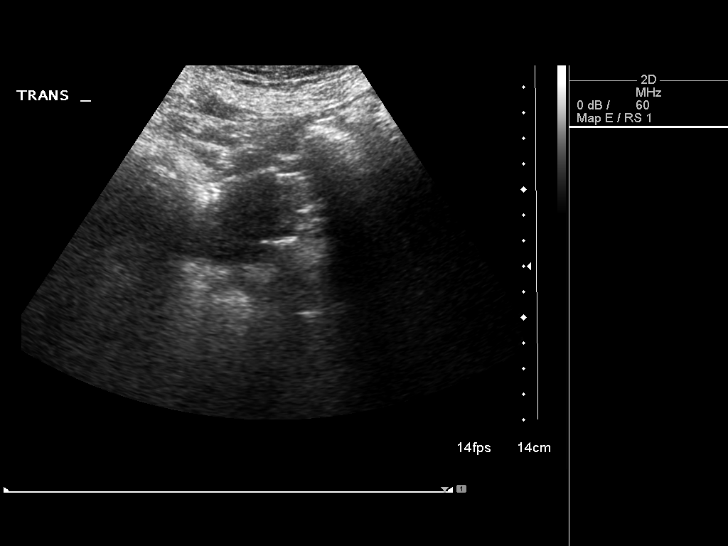
[im 21/61]
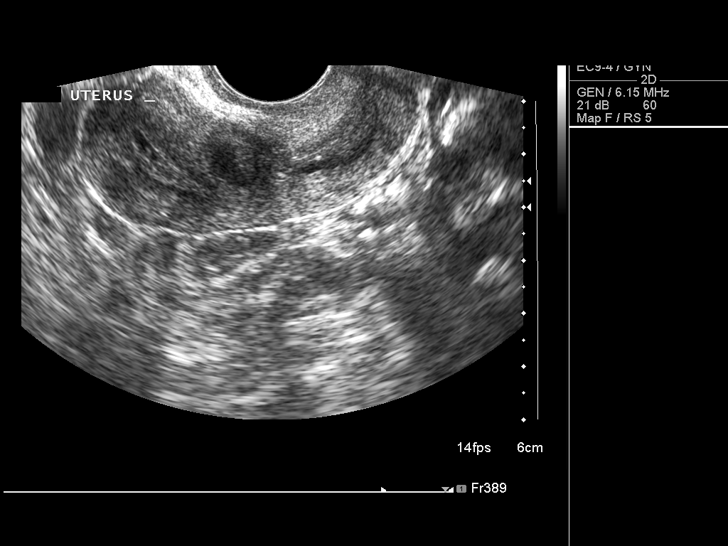
[im 26/61]
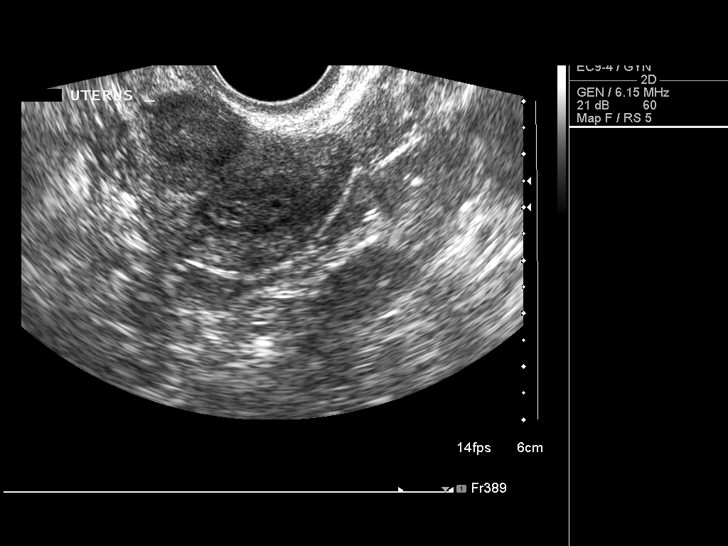
[im 31/61]
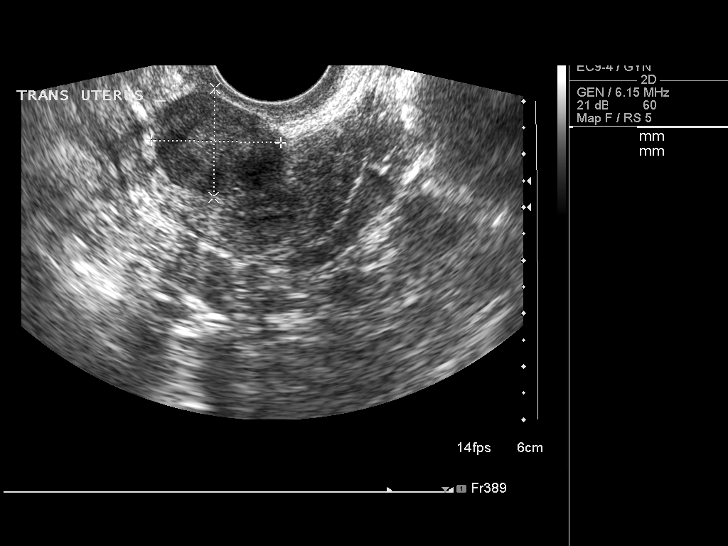
[im 36/61]
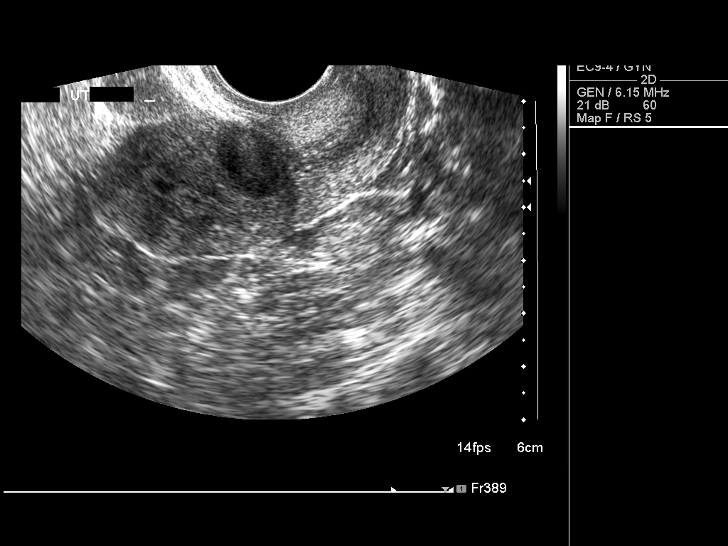
[im 41/61]
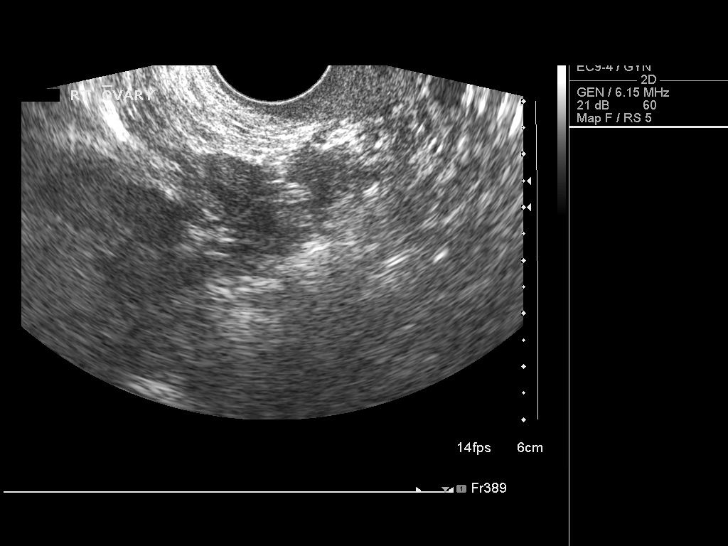
[im 46/61]
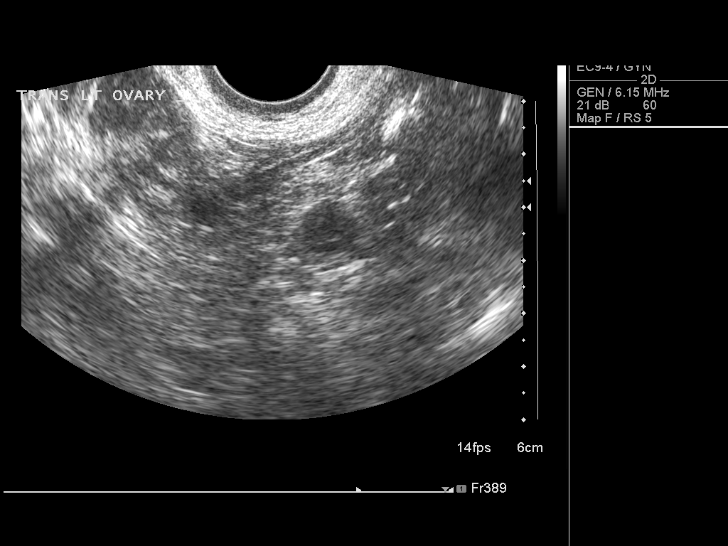
[im 51/61]
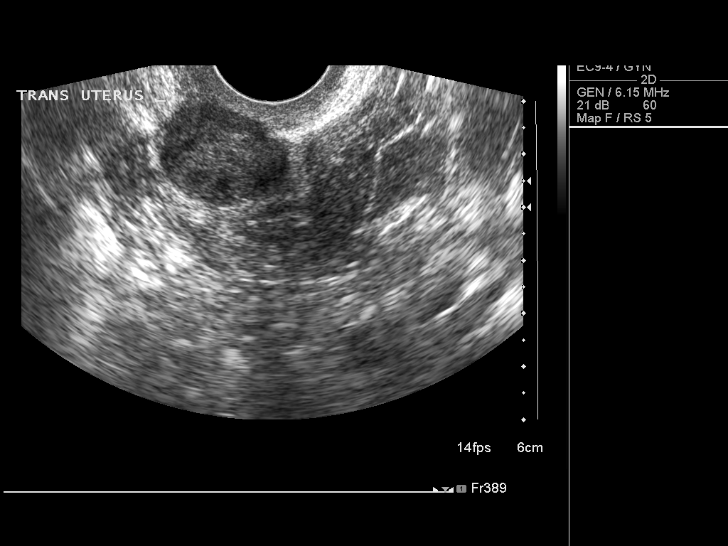
[im 56/61]
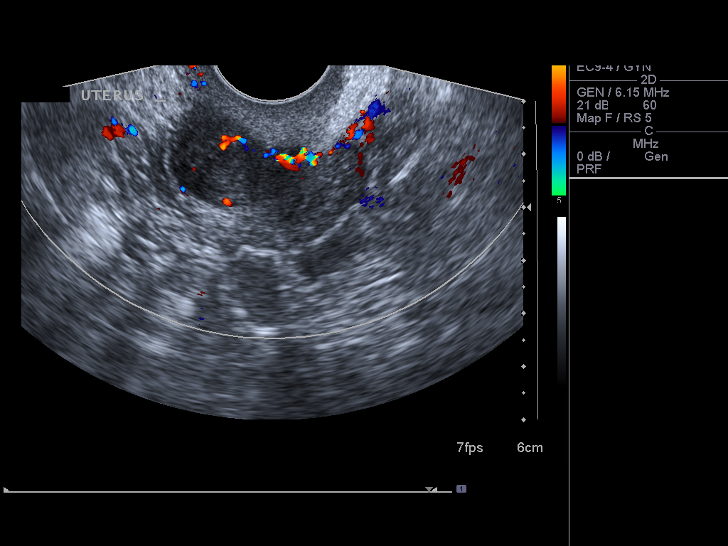
[im 61/61]
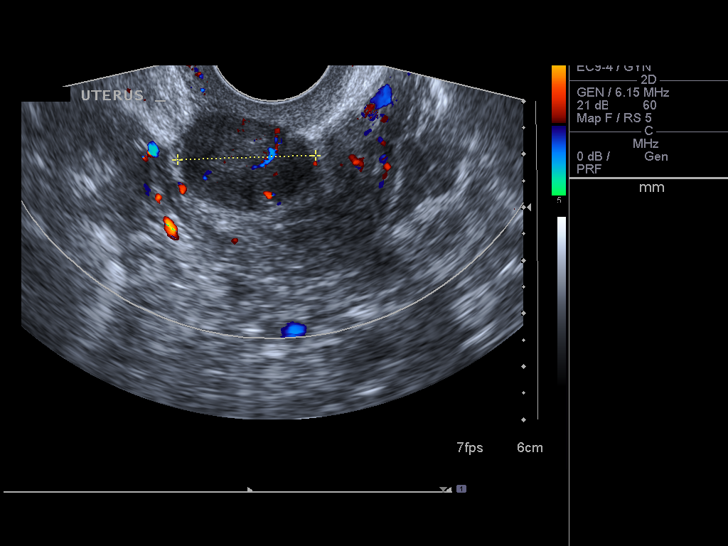

[13 of 25 positions shown; findings below may reference images not displayed]

FINDINGS: Uterus

Measurements: 6.5 x 3.8 x 2.8 cm. Fibroid is noted in anterior
portion of uterus that measures 2.6 x 2.4 x 1.9 cm. This is slightly
but not significantly changed in size or appearance compared to
prior exam.

Endometrium

Thickness: 6.2 mm which is abnormally thickened for postmenopausal
patient. Small amount of fluid is noted in the endometrial space.

Right ovary

Measurements: 2.0 x 1.3 x 1.0 cm. Normal appearance/no adnexal mass.

Left ovary

Measurements: 1.4 x 1.2 x 0.8 cm. Normal appearance/no adnexal mass.

Other findings

No abnormal free fluid.
IMPRESSION: 2.6 cm uterine fibroid is noted which is not significantly changed
compared to prior exam.

Endometrial thickness is considered abnormal for an asymptomatic
post-menopausal female. Endometrial sampling should be considered to
exclude carcinoma.

## 2016-10-18 ENCOUNTER — Encounter: Payer: Medicare Other | Admitting: Internal Medicine

## 2016-11-08 ENCOUNTER — Telehealth: Payer: Self-pay | Admitting: Internal Medicine

## 2016-11-08 NOTE — Telephone Encounter (Signed)
I called to schedule the patient's AWV-S at [facility] clinic on 11/28/16.  The patient's husband stated that he is out of town and will call back tomorrow to schedule it. VDM (DD)

## 2016-11-24 DIAGNOSIS — Z23 Encounter for immunization: Secondary | ICD-10-CM | POA: Diagnosis not present

## 2016-11-28 ENCOUNTER — Non-Acute Institutional Stay: Payer: Medicare Other

## 2016-11-28 VITALS — BP 122/66 | HR 74 | Temp 98.1°F | Ht 64.0 in | Wt 144.0 lb

## 2016-11-28 DIAGNOSIS — Z0000 Encounter for general adult medical examination without abnormal findings: Secondary | ICD-10-CM | POA: Diagnosis not present

## 2016-11-28 NOTE — Progress Notes (Signed)
Subjective:   Kristina Carr is a 71 y.o. female who presents for Medicare Annual (Subsequent) preventive examination at [facility]  Last AWV-04/07/2015       Objective:     Vitals: BP 122/66 (BP Location: Right Arm, Patient Position: Sitting)   Pulse 74   Temp 98.1 F (36.7 C) (Oral)   Ht 5\' 4"  (1.626 m)   Wt 144 lb (65.3 kg)   SpO2 96%   BMI 24.72 kg/m   Body mass index is 24.72 kg/m.   Tobacco History  Smoking Status  . Never Smoker  Smokeless Tobacco  . Never Used    Comment: social     Counseling given: Not Answered   Past Medical History:  Diagnosis Date  . Abdominal pain, epigastric   . Abnormality of gait   . Anxiety state, unspecified   . Arthritis   . Bladder cancer ([place]) 2011   DR. GRAPEY  . Cervicalgia   . Detrusor instability   . Esophageal reflux   . Flatulence, eructation, and gas pain   . Hiatal hernia   . History of recurrent UTIs   . Irritable bowel syndrome   . Lewy body dementia   . Lumbago   . Nonspecific elevation of levels of transaminase or lactic acid dehydrogenase (LDH)   . Other abnormal glucose   . Other and unspecified hyperlipidemia   . Other and unspecified hyperlipidemia   . Other chest pain   . Other malaise and fatigue   . Pain in joint, site unspecified   . Palpitations   . Parkinson's disease    Dr Tonye Royalty, [facility]  . Personal history of other disorders of nervous system and sense organs   . PONV (postoperative nausea and vomiting)   . Restless leg syndrome   . Vitamin D deficiency    Past Surgical History:  Procedure Laterality Date  . APPENDECTOMY    . BLADDER TUMOR EXCISION    . BREAST SURGERY     Biopsy-benign  . BUNIONECTOMY     left  . CESAREAN SECTION    . CHEMOTHERAPY     FLUSH-CANCER EXCISED CYSTOSCOPICALLY FOLLOWED BY INSTALLATION OF CHEMOTHERAPY.  . COLONOSCOPY  2011   neg; Dr Sharlett Iles  . LUMBAR LAMINECTOMY     X 3; last @ age34  . TOTAL HIP ARTHROPLASTY Left  04/12/2012   Procedure: TOTAL HIP ARTHROPLASTY ANTERIOR APPROACH;  Surgeon: Gearlean Alf, MD;  Location: [facility];  Service: Orthopedics;  Laterality: Left;  . TRIGGER FINGER RELEASE     X2  . UPPER GI ENDOSCOPY     hiatal hernia   Family History  Problem Relation Age of Onset  . Lung cancer Father   . Colon cancer Mother   . Hypertension Mother   . Breast cancer Mother 5  . Stomach cancer Maternal Grandmother   . Osteoporosis Maternal Grandmother   . Breast cancer Maternal Aunt 30  . Prostate cancer Paternal Grandfather   . Prostate cancer Unknown        Paternal Great Grandfather  . Prostate cancer Maternal Grandfather   . Colon polyps Sister   . Diabetes Neg Hx   . Stroke Neg Hx   . Heart attack Neg Hx    History  Sexual Activity  . Sexual activity: Yes  . Birth control/ protection: Post-menopausal    Outpatient Encounter Prescriptions as of 11/28/2016  Medication Sig  . acetaminophen (TYLENOL) 500 MG tablet Take 1 tablet (500 mg total)  by mouth every 8 (eight) hours as needed.  . carbidopa-levodopa (SINEMET IR) 25-100 MG per tablet Take 2 tablets by mouth 4 (four) times daily.   . Cholecalciferol (VITAMIN D) 2000 units CAPS Take 1 capsule (2,000 Units total) by mouth daily.  Marland Kitchen donepezil (ARICEPT) 10 MG tablet Take 10 mg by mouth at bedtime.   . pantoprazole (PROTONIX) 20 MG tablet TAKE 1 TABLET BY MOUTH EVERY DAY  . PARoxetine (PAXIL) 20 MG tablet Take 40 mg by mouth every morning.   Marland Kitchen QUEtiapine (SEROQUEL) 50 MG tablet Take 100 mg by mouth at bedtime.    No facility-administered encounter medications on file as of 11/28/2016.     Activities of Daily Living In your present state of health, do you have any difficulty performing the following activities: 11/28/2016  Hearing? N  Vision? N  Difficulty concentrating or making decisions? Y  Walking or climbing stairs? Y  Dressing or bathing? N  Doing errands, shopping? N  Preparing Food and eating ? N  Using the  Toilet? N  In the past six months, have you accidently leaked urine? Y  Do you have problems with loss of bowel control? N  Managing your Medications? Y  Managing your Finances? Y  Housekeeping or managing your Housekeeping? Y  Some recent data might be hidden    Patient Care Team: Gayland Curry, DO as PCP - General (Geriatric Medicine) Gaynelle Arabian, MD as Consulting Physician (Orthopedic Surgery) Ricki Rodriguez, MD as Attending Physician (Psychiatry) Martinique, Amy, MD as Consulting Physician (Dermatology) Rana Snare, MD as Consulting Physician (Urology) Nyoka Cowden Stephannie Li, PA-C as Consulting Physician (Neurology)    Assessment:     Exercise Activities and Dietary recommendations Current Exercise Habits: Home exercise routine, Type of exercise: walking, Time (Minutes): 20, Frequency (Times/Week): 1, Weekly Exercise (Minutes/Week): 20, Intensity: Mild, Exercise limited by: None identified  Goals    None     Fall Risk Fall Risk  11/28/2016 07/12/2016 03/15/2016 11/24/2015 08/25/2015  Falls in the past year? Yes Yes No Yes Yes  Number falls in past yr: 2 or more 2 or more - 1 2 or more  Injury with Fall? No No - Yes -  Risk for fall due to : - - - - -   Depression Screen PHQ 2/9 Scores 11/28/2016 07/12/2016 03/15/2016 11/24/2015  PHQ - 2 Score 6 0 0 0  PHQ- 9 Score 13 - - -     Cognitive Function MMSE - Mini Mental State Exam 11/28/2016 04/07/2015  Orientation to time 2 2  Orientation to Place 4 5  Registration 3 3  Attention/ Calculation 0 3  Recall 2 1  Language- name 2 objects 2 2  Language- repeat 1 1  Language- follow 3 step command 1 2  Language- read & follow direction 1 1  Write a sentence 0 1  Copy design 0 0  Total score 16 21        Immunization History  Administered Date(s) Administered  . Influenza Whole 11/15/2001, 11/08/2009  . Influenza, High Dose Seasonal PF 12/22/2015  . Influenza,inj,Quad PF,6+ Mos 03/11/2014, 11/23/2014  .  Influenza-Unspecified 10/07/2012  . Pneumococcal Conjugate-13 11/23/2014  . Pneumococcal Polysaccharide-23 11/24/2015  . Td 02/09/1992, 07/20/2008  . Zoster 11/14/2010   Screening Tests Health Maintenance  Topic Date Due  . Hepatitis C Screening  01/30/1946  . INFLUENZA VACCINE  09/06/2016  . MAMMOGRAM  04/27/2017  . TETANUS/TDAP  07/21/2018  . COLONOSCOPY  03/21/2020  . DEXA  SCAN  Completed  . PNA vac Low Risk Adult  Completed      Plan:    I have personally reviewed and addressed the Medicare Annual Wellness questionnaire and have noted the following in the patient's chart:  A. Medical and social history B. Use of alcohol, tobacco or illicit drugs  C. Current medications and supplements D. Functional ability and status E.  Nutritional status F.  Physical activity G. Advance directives H. List of other physicians I.  Hospitalizations, surgeries, and ER visits in previous 12 months J.  [place] to include hearing, vision, cognitive, depression L. Referrals and appointments - none  In addition, I have reviewed and discussed with patient certain preventive protocols, quality metrics, and best practice recommendations. A written personalized care plan for preventive services as well as general preventive health recommendations were provided to patient.  See attached scanned questionnaire for additional information.   Signed,   Rich Reining, RN Nurse Health Advisor   Quick Notes   Health Maintenance: Hep C screen due. Shingrix prescription sent to pharmacy.     Abnormal Screen: MMSE 16/30. Did not pass clock drawing     Patient Concerns: Stomach has been gurgling, bloating, cramping for  4-55months.     Nurse Concerns: None

## 2016-11-28 NOTE — Patient Instructions (Addendum)
Kristina Carr , Thank you for taking time to come for your Medicare Wellness Visit. I appreciate your ongoing commitment to your health goals. Please review the following plan we discussed and let me know if I can assist you in the future.   Screening recommendations/referrals: Colonoscopy up to date. Due 03/21/2020 Mammogram up to date. Due 04/27/2017 Bone Density up to date Recommended yearly ophthalmology/optometry visit for glaucoma screening and checkup Recommended yearly dental visit for hygiene and checkup  Vaccinations: Influenza vaccine up to date. Due 2019 fall season. Pneumococcal vaccine up to date. Tdap vaccine up to date. Due 07/21/2018 Shingles vaccine due, prescription sent to pharmacy  Advanced directives: In Chart  Conditions/risks identified: None  Next appointment: No upcoming scheduled.   Preventive Care 65 Years and Older, Female Preventive care refers to lifestyle choices and visits with your health care provider that can promote health and wellness. What does preventive care include?  A yearly physical exam. This is also called an annual well check.  Dental exams once or twice a year.  Routine eye exams. Ask your health care provider how often you should have your eyes checked.  Personal lifestyle choices, including:  Daily care of your teeth and gums.  Regular physical activity.  Eating a healthy diet.  Avoiding tobacco and drug use.  Limiting alcohol use.  Practicing safe sex.  Taking low-dose aspirin every day.  Taking vitamin and mineral supplements as recommended by your health care provider. What happens during an annual well check? The services and screenings done by your health care provider during your annual well check will depend on your age, overall health, lifestyle risk factors, and family history of disease. Counseling  Your health care provider may ask you questions about your:  Alcohol use.  Tobacco use.  Drug  use.  Emotional well-being.  Home and relationship well-being.  Sexual activity.  Eating habits.  History of falls.  Memory and ability to understand (cognition).  Work and work Statistician.  Reproductive health. Screening  You may have the following tests or measurements:  Height, weight, and BMI.  Blood pressure.  Lipid and cholesterol levels. These may be checked every 5 years, or more frequently if you are over 92 years old.  Skin check.  Lung cancer screening. You may have this screening every year starting at age 32 if you have a 30-pack-year history of smoking and currently smoke or have quit within the past 15 years.  Fecal occult blood test (FOBT) of the stool. You may have this test every year starting at age 3.  Flexible sigmoidoscopy or colonoscopy. You may have a sigmoidoscopy every 5 years or a colonoscopy every 10 years starting at age 74.  Hepatitis C blood test.  Hepatitis B blood test.  Sexually transmitted disease (STD) testing.  Diabetes screening. This is done by checking your blood sugar (glucose) after you have not eaten for a while (fasting). You may have this done every 1-3 years.  Bone density scan. This is done to screen for osteoporosis. You may have this done starting at age 29.  Mammogram. This may be done every 1-2 years. Talk to your health care provider about how often you should have regular mammograms. Talk with your health care provider about your test results, treatment options, and if necessary, the need for more tests. Vaccines  Your health care provider may recommend certain vaccines, such as:  Influenza vaccine. This is recommended every year.  Tetanus, diphtheria, and acellular pertussis (Tdap, Td)  vaccine. You may need a Td booster every 10 years.  Zoster vaccine. You may need this after age 35.  Pneumococcal 13-valent conjugate (PCV13) vaccine. One dose is recommended after age 32.  Pneumococcal polysaccharide  (PPSV23) vaccine. One dose is recommended after age 60. Talk to your health care provider about which screenings and vaccines you need and how often you need them. This information is not intended to replace advice given to you by your health care provider. Make sure you discuss any questions you have with your health care provider. Document Released: 02/19/2015 Document Revised: 10/13/2015 Document Reviewed: 11/24/2014 Elsevier Interactive Patient Education  2017 [employer] Prevention in the Home Falls can cause injuries. They can happen to people of all ages. There are many things you can do to make your home safe and to help prevent falls. What can I do on the outside of my home?  Regularly fix the edges of walkways and driveways and fix any cracks.  Remove anything that might make you trip as you walk through a door, such as a raised step or threshold.  Trim any bushes or trees on the path to your home.  Use bright outdoor lighting.  Clear any walking paths of anything that might make someone trip, such as rocks or tools.  Regularly check to see if handrails are loose or broken. Make sure that both sides of any steps have handrails.  Any raised decks and porches should have guardrails on the edges.  Have any leaves, snow, or ice cleared regularly.  Use sand or salt on walking paths during winter.  Clean up any spills in your garage right away. This includes oil or grease spills. What can I do in the bathroom?  Use night lights.  Install grab bars by the toilet and in the tub and shower. Do not use towel bars as grab bars.  Use non-skid mats or decals in the tub or shower.  If you need to sit down in the shower, use a plastic, non-slip stool.  Keep the floor dry. Clean up any water that spills on the floor as soon as it happens.  Remove soap buildup in the tub or shower regularly.  Attach bath mats securely with double-sided non-slip rug tape.  Do not have  throw rugs and other things on the floor that can make you trip. What can I do in the bedroom?  Use night lights.  Make sure that you have a light by your bed that is easy to reach.  Do not use any sheets or blankets that are too big for your bed. They should not hang down onto the floor.  Have a firm chair that has side arms. You can use this for support while you get dressed.  Do not have throw rugs and other things on the floor that can make you trip. What can I do in the kitchen?  Clean up any spills right away.  Avoid walking on wet floors.  Keep items that you use a lot in easy-to-reach places.  If you need to reach something above you, use a strong step stool that has a grab bar.  Keep electrical cords out of the way.  Do not use floor polish or wax that makes floors slippery. If you must use wax, use non-skid floor wax.  Do not have throw rugs and other things on the floor that can make you trip. What can I do with my stairs?  Do not leave  any items on the stairs.  Make sure that there are handrails on both sides of the stairs and use them. Fix handrails that are broken or loose. Make sure that handrails are as long as the stairways.  Check any carpeting to make sure that it is firmly attached to the stairs. Fix any carpet that is loose or worn.  Avoid having throw rugs at the top or bottom of the stairs. If you do have throw rugs, attach them to the floor with carpet tape.  Make sure that you have a light switch at the top of the stairs and the bottom of the stairs. If you do not have them, ask someone to add them for you. What else can I do to help prevent falls?  Wear shoes that:  Do not have high heels.  Have rubber bottoms.  Are comfortable and fit you well.  Are closed at the toe. Do not wear sandals.  If you use a stepladder:  Make sure that it is fully opened. Do not climb a closed stepladder.  Make sure that both sides of the stepladder are  locked into place.  Ask someone to hold it for you, if possible.  Clearly mark and make sure that you can see:  Any grab bars or handrails.  First and last steps.  Where the edge of each step is.  Use tools that help you move around (mobility aids) if they are needed. These include:  Canes.  Walkers.  Scooters.  Crutches.  Turn on the lights when you go into a dark area. Replace any light bulbs as soon as they burn out.  Set up your furniture so you have a clear path. Avoid moving your furniture around.  If any of your floors are uneven, fix them.  If there are any pets around you, be aware of where they are.  Review your medicines with your doctor. Some medicines can make you feel dizzy. This can increase your chance of falling. Ask your doctor what other things that you can do to help prevent falls. This information is not intended to replace advice given to you by your health care provider. Make sure you discuss any questions you have with your health care provider. Document Released: 11/19/2008 Document Revised: 07/01/2015 Document Reviewed: 02/27/2014 Elsevier Interactive Patient Education  2017 [employer].

## 2016-12-19 ENCOUNTER — Emergency Department (HOSPITAL_COMMUNITY)
Admission: EM | Admit: 2016-12-19 | Discharge: 2016-12-19 | Disposition: A | Discharge status: Skilled Nursing Facility | Payer: Medicare Other | Attending: Emergency Medicine | Admitting: Emergency Medicine

## 2016-12-19 ENCOUNTER — Encounter (HOSPITAL_COMMUNITY): Payer: Self-pay

## 2016-12-19 ENCOUNTER — Emergency Department (HOSPITAL_COMMUNITY): Payer: Medicare Other

## 2016-12-19 ENCOUNTER — Other Ambulatory Visit: Payer: Self-pay

## 2016-12-19 DIAGNOSIS — W010XXA Fall on same level from slipping, tripping and stumbling without subsequent striking against object, initial encounter: Secondary | ICD-10-CM | POA: Diagnosis not present

## 2016-12-19 DIAGNOSIS — Z8551 Personal history of malignant neoplasm of bladder: Secondary | ICD-10-CM | POA: Diagnosis not present

## 2016-12-19 DIAGNOSIS — M25551 Pain in right hip: Secondary | ICD-10-CM | POA: Insufficient documentation

## 2016-12-19 DIAGNOSIS — T148XXA Other injury of unspecified body region, initial encounter: Secondary | ICD-10-CM | POA: Diagnosis not present

## 2016-12-19 DIAGNOSIS — G20 Parkinson's disease: Secondary | ICD-10-CM | POA: Insufficient documentation

## 2016-12-19 DIAGNOSIS — W19XXXA Unspecified fall, initial encounter: Secondary | ICD-10-CM

## 2016-12-19 DIAGNOSIS — Z79899 Other long term (current) drug therapy: Secondary | ICD-10-CM | POA: Diagnosis not present

## 2016-12-19 DIAGNOSIS — S3992XA Unspecified injury of lower back, initial encounter: Secondary | ICD-10-CM | POA: Diagnosis not present

## 2016-12-19 NOTE — Discharge Instructions (Signed)
Please read attached information. If you experience any new or worsening signs or symptoms please return to the emergency room for evaluation. Please follow-up with your primary care provider or specialist as discussed.  °

## 2016-12-19 NOTE — ED Provider Notes (Signed)
[facility] DEPT Provider Note   CSN: [id number] Arrival date & time: 12/19/16  1758     History   Chief Complaint No chief complaint on file.   HPI Kristina Carr is a 71 y.o. female.  HPI   71 year old female with a history of Lewy body dementia presents today via EMS status post fall.  Husband is at bedside who reports that he was with her this evening, he went to grab the car and patient was inside a nursing facility.  He notes after pulling up in the car nursing staff noted that patient had a mechanical fall falling backwards landing on her butt.  Patient notes pain to her right hip and lower back.  EMS notes patient was able to ambulate but had an antalgic gait.  No signs of trauma from the fall.  No head injury.  Past Medical History:  Diagnosis Date  . Abdominal pain, epigastric   . Abnormality of gait   . Anxiety state, unspecified   . Arthritis   . Bladder cancer ([place]) 2011   DR. GRAPEY  . Cervicalgia   . Detrusor instability   . Esophageal reflux   . Flatulence, eructation, and gas pain   . Hiatal hernia   . History of recurrent UTIs   . Irritable bowel syndrome   . Lewy body dementia   . Lumbago   . Nonspecific elevation of levels of transaminase or lactic acid dehydrogenase (LDH)   . Other abnormal glucose   . Other and unspecified hyperlipidemia   . Other and unspecified hyperlipidemia   . Other chest pain   . Other malaise and fatigue   . Pain in joint, site unspecified   . Palpitations   . Parkinson's disease    Dr Tonye Royalty, [facility]  . Personal history of other disorders of nervous system and sense organs   . PONV (postoperative nausea and vomiting)   . Restless leg syndrome   . Vitamin D deficiency     Patient Active Problem List   Diagnosis Date Noted  . Arthritis of right hip 11/24/2015  . Endometrial thickening on ultra sound 11/24/2015  . Dorsopathy 08/25/2015  . Baker's cyst 08/25/2015  . Severe single  current episode of major depressive disorder, with psychotic features ([place]) 08/25/2015  . Lewy body dementia without behavioral disturbance 04/07/2015  . Vitamin D insufficiency 04/07/2015  . Malignant neoplasm of urinary bladder ([place]) 04/07/2015  . 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 Well female exam with routine gynecological exam 04/07/2015  . Abdominal pain, left lower quadrant 04/07/2015  . Fibrocystic breast disease in female 06/25/2012  . B12 deficiency 05/29/2012  . OA (osteoarthritis) of hip 04/12/2012  . Detrusor instability   . Bladder cancer ([place])   . GASTRITIS, ACUTE W/O HEMORRHAGE 03/21/2010  . PARKINSON'S DISEASE 03/11/2010  . 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 ABDOMINAL PAIN, GENERALIZED 03/11/2010  . HIATAL HERNIA WITH REFLUX 03/10/2010  . IRRITABLE BOWEL SYNDROME 12/02/2009  . ARTHRALGIA 09/23/2009  . HYPERGLYCEMIA, FASTING 09/23/2009  . NECK PAIN, CHRONIC 10/22/2008  . LOW BACK PAIN, CHRONIC 10/22/2008  . ANXIETY STATE, UNSPECIFIED 07/20/2008  . FATIGUE 07/20/2008  . NONSPEC ELEVATION OF LEVELS OF TRANSAMINASE/LDH 07/20/2008  . PALPITATIONS 04/26/2007  . Hyperlipidemia 01/07/2007  . ACID REFLUX DISEASE 01/07/2007  . RESTLESS LEG SYNDROME, HX OF 01/07/2007    Past Surgical History:  Procedure Laterality Date  . APPENDECTOMY    . BLADDER TUMOR EXCISION    . BREAST SURGERY  Biopsy-benign  . BUNIONECTOMY     left  . CESAREAN SECTION    . CHEMOTHERAPY     FLUSH-CANCER EXCISED CYSTOSCOPICALLY FOLLOWED BY INSTALLATION OF CHEMOTHERAPY.  . COLONOSCOPY  2011   neg; Dr Sharlett Iles  . LUMBAR LAMINECTOMY     X 3; last @ age34  . TRIGGER FINGER RELEASE     X2  . UPPER GI ENDOSCOPY     hiatal hernia    OB History    Gravida Para Term Preterm AB Living   1 1 1    0 1   SAB TAB Ectopic Multiple Live Births                   Home Medications    Prior to Admission medications   Medication Sig Start Date End Date Taking? Authorizing  Provider  carbidopa-levodopa (SINEMET IR) 25-100 MG per tablet Take 2 tablets by mouth 4 (four) times daily.    Yes [provider]  Cholecalciferol (VITAMIN D) 2000 units CAPS Take 1 capsule (2,000 Units total) by mouth daily. 04/07/15  Yes Reed, Tiffany L, DO  diazepam (VALIUM) 5 MG tablet Take 5 mg every 6 (six) hours as needed by mouth for anxiety.   Yes [provider]  donepezil (ARICEPT) 5 MG tablet Take 5 mg at bedtime by mouth.   Yes [provider]  PARoxetine (PAXIL) 20 MG tablet Take 40 mg by mouth every morning.  09/04/12  Yes [provider]  QUEtiapine (SEROQUEL) 50 MG tablet Take 100 mg by mouth at bedtime.  05/26/15  Yes [provider]  acetaminophen (TYLENOL) 500 MG tablet Take 1 tablet (500 mg total) by mouth every 8 (eight) hours as needed. 03/15/16   Reed, Tiffany L, DO  donepezil (ARICEPT) 10 MG tablet Take 5 mg at bedtime by mouth.  08/12/15   [provider]  pantoprazole (PROTONIX) 20 MG tablet TAKE 1 TABLET BY MOUTH EVERY DAY 08/10/16   Gayland Curry, DO    Family History Family History  Problem Relation Age of Onset  . Lung cancer Father   . Colon cancer Mother   . Hypertension Mother   . Breast cancer Mother 14  . Stomach cancer Maternal Grandmother   . Osteoporosis Maternal Grandmother   . Breast cancer Maternal Aunt 30  . Prostate cancer Paternal Grandfather   . Prostate cancer Unknown        Paternal Great Grandfather  . Prostate cancer Maternal Grandfather   . Colon polyps Sister   . Diabetes Neg Hx   . Stroke Neg Hx   . Heart attack Neg Hx     Social History Social History   Tobacco Use  . Smoking status: Never Smoker  . Smokeless tobacco: Never Used  . Tobacco comment: social  Substance Use Topics  . Alcohol use: No  . Drug use: No     Allergies   Omeprazole; Simvastatin; and Venlafaxine   Review of Systems Review of Systems  All other systems reviewed and are negative.    Physical  Exam Updated Vital Signs BP 110/76 (BP Location: Left Arm)   Pulse 74   Temp 98.7 F (37.1 C) (Oral)   Resp 18   Ht 5\' 4"  (1.626 m)   Wt 65.3 kg (144 lb)   SpO2 97%   BMI 24.72 kg/m   Physical Exam  Constitutional: She is oriented to person, place, and time. She appears well-developed and well-nourished.  HENT:  Head: Normocephalic and  atraumatic.  Eyes: Conjunctivae are normal. Pupils are equal, round, and reactive to light. Right eye exhibits no discharge. Left eye exhibits no discharge. No scleral icterus.  Neck: Normal range of motion. No JVD present. No tracheal deviation present.  Pulmonary/Chest: Effort normal. No stridor.  Musculoskeletal:  No C or T-spine tenderness to palpation, tenderness palpation of lower lumbar spine and right posterior hip.  Tenderness palpation of right lateral hip no significant pain with range of motion patient is able to flex hip to 45 degrees without discomfort-remainder of lower extremity nontender to palpation with no signs of trauma  Left hip nontender to palpation hip is stable, left lower extremity nontender full active range of motion  Bilateral upper extremities nontender to palpation no swelling or edema or  signs of trauma  Neurological: She is alert and oriented to person, place, and time. Coordination normal.  Psychiatric: She has a normal mood and affect. Her behavior is normal. Judgment and thought content normal.  Nursing note and vitals reviewed.    ED Treatments / Results  Labs (all labs ordered are listed, but only abnormal results are displayed) Labs Reviewed - No data to display  EKG  EKG Interpretation None       Radiology Dg Lumbar Spine Complete  Result Date: 12/19/2016 CLINICAL DATA:  Fall EXAM: LUMBAR SPINE - COMPLETE 4+ VIEW COMPARISON:  None. FINDINGS: There is transitional lumbosacral anatomy with 6 non-rib-bearing vertebral bodies. For the purposes of this dictation, these levels are considered T12-L5.  There is disc space narrowing at all levels, greatest at L2-L3. No static subluxation. No acute fracture. Mild lower lumbar facet arthrosis. IMPRESSION: 1. Transitional lumbosacral anatomy. The lowest disc space is considered L5-S1. 2. Multilevel degenerative disc disease without acute fracture or static subluxation. Electronically Signed   By: Ulyses Jarred M.D.   On: 12/19/2016 19:33   Dg Hip Unilat With Pelvis 2-3 Views Right  Result Date: 12/19/2016 CLINICAL DATA:  Right hip pain after fall EXAM: DG HIP (WITH OR WITHOUT PELVIS) 2-3V RIGHT COMPARISON:  None. FINDINGS: There is no evidence of hip fracture or dislocation. There is no evidence of arthropathy or other focal bone abnormality. There is a left total hip arthroplasty that is normal in appearance. IMPRESSION: No fracture, dislocation or advanced arthropathy of the right hip. Electronically Signed   By: Ulyses Jarred M.D.   On: 12/19/2016 19:39    Procedures Procedures (including critical care time)  Medications Ordered in ED Medications - No data to display   Initial Impression / Assessment and Plan / ED Course  I have reviewed the triage vital signs and the nursing notes.  Pertinent labs & imaging results that were available during my care of the patient were reviewed by me and considered in my medical decision making (see chart for details).     Final Clinical Impressions(s) / ED Diagnoses   Final diagnoses:  Fall  Right hip pain   Labs:   Imaging: DG hip unilateral right, DG lumbar spine  Consults:  Therapeutics:  Discharge Meds:   Assessment/Plan: 71 year old female presents status post fall.  Patient has history of dementia.  No acute signs of trauma on exam.  Plain films without acute findings.  Patient ambulated in the ED without significant difficulty.  Patient be discharged home with her husband with return precautions.  No further questions or concerns the time discharge.   ED Discharge Orders    None        Ean Gettel, Dellis Filbert, PA-C  12/19/16 1958    Sherwood Gambler, MD 12/20/16 [phone]

## 2016-12-19 NOTE — ED Triage Notes (Signed)
Patient presented to [facility] with c/o right hip/wrist pain and neck pain. Pt had a fall today. Pt have hx of parkinson. It was witness fall. Pt denies being on blood thinners. No loss of consciousness. Pt is coming from [facility].

## 2016-12-20 ENCOUNTER — Encounter: Payer: Self-pay | Admitting: Internal Medicine

## 2016-12-20 ENCOUNTER — Non-Acute Institutional Stay: Payer: Medicare Other | Admitting: Internal Medicine

## 2016-12-20 VITALS — BP 140/80 | HR 77 | Temp 97.8°F | Wt 147.0 lb

## 2016-12-20 DIAGNOSIS — R2689 Other abnormalities of gait and mobility: Secondary | ICD-10-CM | POA: Diagnosis not present

## 2016-12-20 DIAGNOSIS — M545 Low back pain, unspecified: Secondary | ICD-10-CM

## 2016-12-20 DIAGNOSIS — G8929 Other chronic pain: Secondary | ICD-10-CM

## 2016-12-20 DIAGNOSIS — G3183 Dementia with Lewy bodies: Secondary | ICD-10-CM | POA: Diagnosis not present

## 2016-12-20 DIAGNOSIS — K581 Irritable bowel syndrome with constipation: Secondary | ICD-10-CM

## 2016-12-20 DIAGNOSIS — F0280 Dementia in other diseases classified elsewhere without behavioral disturbance: Secondary | ICD-10-CM | POA: Diagnosis not present

## 2016-12-20 DIAGNOSIS — G20 Parkinson's disease: Secondary | ICD-10-CM

## 2016-12-20 DIAGNOSIS — G20A1 Parkinson's disease without dyskinesia, without mention of fluctuations: Secondary | ICD-10-CM

## 2016-12-20 NOTE — Progress Notes (Signed)
Location:  Occupational psychologist of Service:  Clinic (12)  Provider: Duy Lemming L. Mariea Clonts, D.O., C.M.D.  Code Status: DNR, MOST form Goals of Care:  Advanced Directives 12/19/2016  Does Patient Have a Medical Advance Directive? Yes  Type of Paramedic of [place];Living will  Does patient want to make changes to medical advance directive? No - Patient declined  Copy of [place] in Chart? No - copy requested  Would patient like information on creating a medical advance directive? -  Pre-existing out of facility DNR order (yellow form or pink MOST form) -   Chief Complaint  Patient presents with  . Follow-up    abdominal pain, ER follow-up    HPI: Patient is a 71 y.o. female seen today for medical management of chronic diseases.    Had a fall.  They were going to the car.  Mr Cocke was going for the car and she had to go to the restroom.  She ran down the hall to the restroom.  She fell.  She was evaluated by the clinic nurse.  She sent her to the ED.  She did not have any big pains.  She is stiff today.  Labs and imaging were unremarkable there.  Notes reviewed.  She quit taking the medication for her abdomen and pain eased up.  Happening a lot since she last saw me.  Her sister who is a nurse suggested she eat something before her morning pills--she is eating yogurt each morning now and part of a banana or piece of bread before the pills.  Didn't complain as much after that, but still doing it now and she's having some pain again this am.  They went to a bday party last night.  Didn't want to eat at dinner then.  She did not eat anything last night so having pain this am.  Has been eating fairly well.  Does not eat everything volume wise.  Both of them had bad colds for weeks.  Had a terrible cough with it and she can't take DM meds with her Parkinson's and dementia.    Eating a lot at the bistro salad bar.  He reduced her  size of her salads.  Not going regularly, but improved bms from the past.  Having the high grain cereal.    Valium was given at the ED.  They have not filled it.  Discussed it's not recommended.  Mr. Harling remembers being told that by neurology and psychiatry before--removed from list.  Dementia has gotten worse.  +When she gets excited or frustrated, her husband reports that whatever he tells her not to do, she doesn't listen.    Past Medical History:  Diagnosis Date  . Abdominal pain, epigastric   . Abnormality of gait   . Anxiety state, unspecified   . Arthritis   . Bladder cancer ([place]) 2011   DR. GRAPEY  . Cervicalgia   . Detrusor instability   . Esophageal reflux   . Flatulence, eructation, and gas pain   . Hiatal hernia   . History of recurrent UTIs   . Irritable bowel syndrome   . Lewy body dementia   . Lumbago   . Nonspecific elevation of levels of transaminase or lactic acid dehydrogenase (LDH)   . Other abnormal glucose   . Other and unspecified hyperlipidemia   . Other and unspecified hyperlipidemia   . Other chest pain   . Other malaise and fatigue   .  Pain in joint, site unspecified   . Palpitations   . Parkinson's disease    Dr Tonye Royalty, [facility]  . Personal history of other disorders of nervous system and sense organs   . PONV (postoperative nausea and vomiting)   . Restless leg syndrome   . Vitamin D deficiency     Past Surgical History:  Procedure Laterality Date  . APPENDECTOMY    . BLADDER TUMOR EXCISION    . BREAST SURGERY     Biopsy-benign  . BUNIONECTOMY     left  . CESAREAN SECTION    . CHEMOTHERAPY     FLUSH-CANCER EXCISED CYSTOSCOPICALLY FOLLOWED BY INSTALLATION OF CHEMOTHERAPY.  . COLONOSCOPY  2011   neg; Dr Sharlett Iles  . LUMBAR LAMINECTOMY     X 3; last @ age34  . TRIGGER FINGER RELEASE     X2  . UPPER GI ENDOSCOPY     hiatal hernia    Allergies  Allergen Reactions  . Omeprazole     REACTION: HIVES Because of a history of documented  adverse serious drug reaction;Medi Alert bracelet  is recommended  . Simvastatin     REACTION: LEG CRAMPS  . Venlafaxine     REACTION: nausea    Outpatient Encounter Medications as of 12/20/2016  Medication Sig  . acetaminophen (TYLENOL) 500 MG tablet Take 1 tablet (500 mg total) by mouth every 8 (eight) hours as needed.  . carbidopa-levodopa (SINEMET IR) 25-100 MG per tablet Take 2 tablets by mouth 4 (four) times daily.   . Cholecalciferol (VITAMIN D) 2000 units CAPS Take 1 capsule (2,000 Units total) by mouth daily.  . diazepam (VALIUM) 5 MG tablet Take 5 mg every 6 (six) hours as needed by mouth for anxiety.  . donepezil (ARICEPT) 10 MG tablet Take 10 mg at bedtime by mouth.   Marland Kitchen PARoxetine (PAXIL) 20 MG tablet Take 40 mg by mouth every morning.   Marland Kitchen QUEtiapine (SEROQUEL) 50 MG tablet Take 100 mg by mouth at bedtime.   . [DISCONTINUED] donepezil (ARICEPT) 5 MG tablet Take 5 mg at bedtime by mouth.  . [DISCONTINUED] donepezil (ARICEPT) 10 MG tablet Take 5 mg at bedtime by mouth.   . [DISCONTINUED] pantoprazole (PROTONIX) 20 MG tablet TAKE 1 TABLET BY MOUTH EVERY DAY   No facility-administered encounter medications on file as of 12/20/2016.     Review of Systems:  Review of Systems  Constitutional: Negative for chills, fever and malaise/fatigue.  HENT: Negative for congestion and hearing loss.   Eyes: Negative for blurred vision.  Respiratory: Negative for cough and shortness of breath.   Cardiovascular: Negative for chest pain, palpitations and leg swelling.  Gastrointestinal: Positive for constipation. Negative for abdominal pain, blood in stool, diarrhea, heartburn and melena.  Genitourinary: Negative for dysuria and hematuria.  Musculoskeletal: Positive for falls, joint pain and myalgias.  Skin: Negative for itching and rash.  Neurological: Positive for tremors. Negative for dizziness, loss of consciousness and weakness.  Endo/Heme/Allergies: Bruises/bleeds easily.    Psychiatric/Behavioral: Positive for depression, hallucinations and memory loss. Negative for suicidal ideas. The patient is not nervous/anxious and does not have insomnia.     Health Maintenance  Topic Date Due  . Hepatitis C Screening  1945/10/08  . MAMMOGRAM  04/27/2017  . TETANUS/TDAP  07/21/2018  . COLONOSCOPY  03/21/2020  . INFLUENZA VACCINE  Completed  . DEXA SCAN  Completed  . PNA vac Low Risk Adult  Completed    Physical Exam: Vitals:   12/20/16 1004  BP:  140/80  Pulse: 77  Temp: 97.8 F (36.6 C)  TempSrc: Oral  SpO2: 97%  Weight: 147 lb (66.7 kg)   Body mass index is 25.23 kg/m. Physical Exam  Constitutional: She appears well-developed and well-nourished. No distress.  Cardiovascular: Normal rate, regular rhythm, normal heart sounds and intact distal pulses.  Pulmonary/Chest: Effort normal and breath sounds normal. No respiratory distress.  Abdominal: Soft. Bowel sounds are normal. She exhibits no distension.  Musculoskeletal: She exhibits no edema, tenderness or deformity.  Impressively festinating gait, very unsteady and did not bring her cane today even after falling last evening  Neurological: She is alert.  Oriented to person and place, not time  Skin: Skin is warm and dry.  Small ecchymoses right posterior thigh and right posterior ribs  Psychiatric: She has a normal mood and affect.    Labs reviewed: Basic Metabolic Panel: Recent Labs    07/04/16 0800  NA 142  K 4.3  BUN 18  CREATININE 0.9   Liver Function Tests: Recent Labs    07/04/16 0800  AST 10*  ALT 15  ALKPHOS 60   No results for input(s): LIPASE, AMYLASE in the last 8760 hours. No results for input(s): AMMONIA in the last 8760 hours. CBC: Recent Labs    07/04/16  WBC 6.5  HGB 14.2  HCT 44  PLT 297   Lipid Panel: Recent Labs    07/04/16 0800  CHOL 193  HDL 43  LDLCALC 116  TRIG 171*   Lab Results  Component Value Date   HGBA1C 6.0 02/26/2012    Procedures  since last visit: Dg Lumbar Spine Complete  Result Date: 12/19/2016 CLINICAL DATA:  Fall EXAM: LUMBAR SPINE - COMPLETE 4+ VIEW COMPARISON:  None. FINDINGS: There is transitional lumbosacral anatomy with 6 non-rib-bearing vertebral bodies. For the purposes of this dictation, these levels are considered T12-L5. There is disc space narrowing at all levels, greatest at L2-L3. No static subluxation. No acute fracture. Mild lower lumbar facet arthrosis. IMPRESSION: 1. Transitional lumbosacral anatomy. The lowest disc space is considered L5-S1. 2. Multilevel degenerative disc disease without acute fracture or static subluxation. Electronically Signed   By: Ulyses Jarred M.D.   On: 12/19/2016 19:33   Dg Hip Unilat With Pelvis 2-3 Views Right  Result Date: 12/19/2016 CLINICAL DATA:  Right hip pain after fall EXAM: DG HIP (WITH OR WITHOUT PELVIS) 2-3V RIGHT COMPARISON:  None. FINDINGS: There is no evidence of hip fracture or dislocation. There is no evidence of arthropathy or other focal bone abnormality. There is a left total hip arthroplasty that is normal in appearance. IMPRESSION: No fracture, dislocation or advanced arthropathy of the right hip. Electronically Signed   By: Ulyses Jarred M.D.   On: 12/19/2016 19:39    Assessment/Plan 1. PARKINSON'S DISEASE -more difficulty with festinating gait lately and not adherent with use of assistive device, had a fall last night when running to the restroom -cont sinemet 2 tabs qid - Do not attempt resuscitation (DNR) order reentered as removed and not replaced in ED  2. Lewy body dementia without behavioral disturbance -managed by psych at [facility], cont aricept and seroquel at hs, is progressing with worsening short term memory per her husband, she reports concerns about his memory!   -has had depression also associated and remains on paxil for it  - Do not attempt resuscitation (DNR)  3. Irritable bowel syndrome with constipation - constipation does  better if consistent with fiber and hydration (see hpi) - Do  not attempt resuscitation (DNR)  4. Chronic midline low back pain without sciatica -uses prn tylenol when severe, happens with prolonged time on feet - Do not attempt resuscitation (DNR)  5. Festinating gait -more of an issue lately and led to fall last night (also was turning to go into restroom when it happened)--referral placed for PT eval and tx - Do not attempt resuscitation (DNR)  Labs/tests ordered:   Orders Placed This Encounter  Procedures  . Do not attempt resuscitation (DNR)    Discussed at clinic visit, scanned copy should be in documents and media    Order Specific Question:   In the event of cardiac or respiratory ARREST    Answer:   Do not call a "code blue"    Order Specific Question:   In the event of cardiac or respiratory ARREST    Answer:   Do not perform Intubation, CPR, defibrillation or ACLS    Order Specific Question:   In the event of cardiac or respiratory ARREST    Answer:   Use medication by any route, position, wound care, and other measures to relive pain and suffering. May use oxygen, suction and manual treatment of airway obstruction as needed for comfort.   Next appt: 04/25/2017 med mgt   Maurico Perrell L. Cailyn Houdek, D.O. [facility] [address], [place] [address] Cell Phone (Mon-Fri 8am-5pm):  [phone] On Call:  [phone] & follow prompts after 5pm & weekends Office Phone:  [phone] Office Fax:  [phone]

## 2017-01-01 DIAGNOSIS — R2689 Other abnormalities of gait and mobility: Secondary | ICD-10-CM | POA: Insufficient documentation

## 2017-01-18 DIAGNOSIS — R2681 Unsteadiness on feet: Secondary | ICD-10-CM | POA: Diagnosis not present

## 2017-01-18 DIAGNOSIS — Z9181 History of falling: Secondary | ICD-10-CM | POA: Diagnosis not present

## 2017-01-18 DIAGNOSIS — G3183 Dementia with Lewy bodies: Secondary | ICD-10-CM | POA: Diagnosis not present

## 2017-01-18 DIAGNOSIS — R2689 Other abnormalities of gait and mobility: Secondary | ICD-10-CM | POA: Diagnosis not present

## 2017-01-18 DIAGNOSIS — G20 Parkinson's disease: Secondary | ICD-10-CM | POA: Diagnosis not present

## 2017-01-18 DIAGNOSIS — R278 Other lack of coordination: Secondary | ICD-10-CM | POA: Diagnosis not present

## 2017-01-19 DIAGNOSIS — Z79899 Other long term (current) drug therapy: Secondary | ICD-10-CM | POA: Diagnosis not present

## 2017-01-19 DIAGNOSIS — R258 Other abnormal involuntary movements: Secondary | ICD-10-CM | POA: Diagnosis not present

## 2017-01-19 DIAGNOSIS — G245 Blepharospasm: Secondary | ICD-10-CM | POA: Diagnosis not present

## 2017-01-19 DIAGNOSIS — G3183 Dementia with Lewy bodies: Secondary | ICD-10-CM | POA: Diagnosis not present

## 2017-01-19 DIAGNOSIS — Z888 Allergy status to other drugs, medicaments and biological substances status: Secondary | ICD-10-CM | POA: Diagnosis not present

## 2017-01-19 DIAGNOSIS — F0280 Dementia in other diseases classified elsewhere without behavioral disturbance: Secondary | ICD-10-CM | POA: Diagnosis not present

## 2017-01-19 DIAGNOSIS — R350 Frequency of micturition: Secondary | ICD-10-CM | POA: Diagnosis not present

## 2017-01-24 DIAGNOSIS — R2689 Other abnormalities of gait and mobility: Secondary | ICD-10-CM | POA: Diagnosis not present

## 2017-01-24 DIAGNOSIS — R278 Other lack of coordination: Secondary | ICD-10-CM | POA: Diagnosis not present

## 2017-01-24 DIAGNOSIS — G20 Parkinson's disease: Secondary | ICD-10-CM | POA: Diagnosis not present

## 2017-01-24 DIAGNOSIS — G3183 Dementia with Lewy bodies: Secondary | ICD-10-CM | POA: Diagnosis not present

## 2017-01-24 DIAGNOSIS — Z9181 History of falling: Secondary | ICD-10-CM | POA: Diagnosis not present

## 2017-01-24 DIAGNOSIS — R2681 Unsteadiness on feet: Secondary | ICD-10-CM | POA: Diagnosis not present

## 2017-02-01 DIAGNOSIS — G20 Parkinson's disease: Secondary | ICD-10-CM | POA: Diagnosis not present

## 2017-02-01 DIAGNOSIS — Z9181 History of falling: Secondary | ICD-10-CM | POA: Diagnosis not present

## 2017-02-01 DIAGNOSIS — G3183 Dementia with Lewy bodies: Secondary | ICD-10-CM | POA: Diagnosis not present

## 2017-02-01 DIAGNOSIS — R2689 Other abnormalities of gait and mobility: Secondary | ICD-10-CM | POA: Diagnosis not present

## 2017-02-01 DIAGNOSIS — R2681 Unsteadiness on feet: Secondary | ICD-10-CM | POA: Diagnosis not present

## 2017-02-01 DIAGNOSIS — R278 Other lack of coordination: Secondary | ICD-10-CM | POA: Diagnosis not present

## 2017-03-01 DIAGNOSIS — C672 Malignant neoplasm of lateral wall of bladder: Secondary | ICD-10-CM | POA: Diagnosis not present

## 2017-03-01 DIAGNOSIS — N3281 Overactive bladder: Secondary | ICD-10-CM | POA: Diagnosis not present

## 2017-03-01 DIAGNOSIS — N3946 Mixed incontinence: Secondary | ICD-10-CM | POA: Diagnosis not present

## 2017-04-25 ENCOUNTER — Encounter: Payer: Medicare Other | Admitting: Internal Medicine

## 2017-05-18 DIAGNOSIS — N390 Urinary tract infection, site not specified: Secondary | ICD-10-CM | POA: Diagnosis not present

## 2017-05-18 DIAGNOSIS — R319 Hematuria, unspecified: Secondary | ICD-10-CM | POA: Diagnosis not present

## 2017-05-30 ENCOUNTER — Emergency Department (HOSPITAL_COMMUNITY)
Admission: EM | Admit: 2017-05-30 | Discharge: 2017-05-30 | Discharge status: Against Medical Advice | Payer: Medicare Other | Attending: Emergency Medicine | Admitting: Emergency Medicine

## 2017-05-30 ENCOUNTER — Encounter: Payer: Self-pay | Admitting: Physician Assistant

## 2017-05-30 ENCOUNTER — Encounter (HOSPITAL_COMMUNITY): Payer: Self-pay | Admitting: Emergency Medicine

## 2017-05-30 ENCOUNTER — Other Ambulatory Visit: Payer: Self-pay

## 2017-05-30 DIAGNOSIS — Z5321 Procedure and treatment not carried out due to patient leaving prior to being seen by health care provider: Secondary | ICD-10-CM | POA: Insufficient documentation

## 2017-05-30 DIAGNOSIS — R1030 Lower abdominal pain, unspecified: Secondary | ICD-10-CM | POA: Diagnosis present

## 2017-05-30 NOTE — ED Triage Notes (Signed)
Pt complaint of intermittent lower abdominal cramping with nausea for months; denies at present. "Couldn't take it anymore today."

## 2017-05-30 NOTE — ED Notes (Signed)
registration stated pt left building

## 2017-06-10 DIAGNOSIS — T148XXA Other injury of unspecified body region, initial encounter: Secondary | ICD-10-CM | POA: Diagnosis not present

## 2017-06-10 DIAGNOSIS — S52602A Unspecified fracture of lower end of left ulna, initial encounter for closed fracture: Secondary | ICD-10-CM | POA: Diagnosis not present

## 2017-06-10 DIAGNOSIS — S199XXA Unspecified injury of neck, initial encounter: Secondary | ICD-10-CM | POA: Diagnosis not present

## 2017-06-10 DIAGNOSIS — F0280 Dementia in other diseases classified elsewhere without behavioral disturbance: Secondary | ICD-10-CM | POA: Diagnosis not present

## 2017-06-10 DIAGNOSIS — S0990XA Unspecified injury of head, initial encounter: Secondary | ICD-10-CM | POA: Diagnosis not present

## 2017-06-10 DIAGNOSIS — Z79899 Other long term (current) drug therapy: Secondary | ICD-10-CM | POA: Diagnosis not present

## 2017-06-10 DIAGNOSIS — S52612A Displaced fracture of left ulna styloid process, initial encounter for closed fracture: Secondary | ICD-10-CM | POA: Diagnosis not present

## 2017-06-10 DIAGNOSIS — S52502A Unspecified fracture of the lower end of left radius, initial encounter for closed fracture: Secondary | ICD-10-CM | POA: Diagnosis not present

## 2017-06-10 DIAGNOSIS — S52592A Other fractures of lower end of left radius, initial encounter for closed fracture: Secondary | ICD-10-CM | POA: Diagnosis not present

## 2017-06-10 DIAGNOSIS — M79605 Pain in left leg: Secondary | ICD-10-CM | POA: Diagnosis not present

## 2017-06-10 DIAGNOSIS — S52302A Unspecified fracture of shaft of left radius, initial encounter for closed fracture: Secondary | ICD-10-CM | POA: Diagnosis not present

## 2017-06-10 DIAGNOSIS — G3183 Dementia with Lewy bodies: Secondary | ICD-10-CM | POA: Diagnosis not present

## 2017-06-11 DIAGNOSIS — S52602A Unspecified fracture of lower end of left ulna, initial encounter for closed fracture: Secondary | ICD-10-CM | POA: Diagnosis not present

## 2017-06-11 DIAGNOSIS — S52509G Unspecified fracture of the lower end of unspecified radius, subsequent encounter for closed fracture with delayed healing: Secondary | ICD-10-CM | POA: Diagnosis not present

## 2017-06-11 DIAGNOSIS — G3183 Dementia with Lewy bodies: Secondary | ICD-10-CM | POA: Diagnosis not present

## 2017-06-11 DIAGNOSIS — S52502A Unspecified fracture of the lower end of left radius, initial encounter for closed fracture: Secondary | ICD-10-CM | POA: Diagnosis not present

## 2017-06-11 DIAGNOSIS — W108XXA Fall (on) (from) other stairs and steps, initial encounter: Secondary | ICD-10-CM | POA: Diagnosis not present

## 2017-06-15 ENCOUNTER — Ambulatory Visit: Payer: Medicare Other | Admitting: Physician Assistant

## 2017-06-15 DIAGNOSIS — M25532 Pain in left wrist: Secondary | ICD-10-CM | POA: Diagnosis not present

## 2017-06-15 DIAGNOSIS — S52502A Unspecified fracture of the lower end of left radius, initial encounter for closed fracture: Secondary | ICD-10-CM | POA: Diagnosis not present

## 2017-06-18 ENCOUNTER — Encounter: Payer: Self-pay | Admitting: Internal Medicine

## 2017-06-18 DIAGNOSIS — S52502A Unspecified fracture of the lower end of left radius, initial encounter for closed fracture: Secondary | ICD-10-CM | POA: Insufficient documentation

## 2017-06-18 DIAGNOSIS — M25532 Pain in left wrist: Secondary | ICD-10-CM | POA: Diagnosis not present

## 2017-06-18 DIAGNOSIS — S52613A Displaced fracture of unspecified ulna styloid process, initial encounter for closed fracture: Secondary | ICD-10-CM | POA: Insufficient documentation

## 2017-06-18 DIAGNOSIS — S52532D Colles' fracture of left radius, subsequent encounter for closed fracture with routine healing: Secondary | ICD-10-CM | POA: Diagnosis not present

## 2017-06-18 DIAGNOSIS — S52615D Nondisplaced fracture of left ulna styloid process, subsequent encounter for closed fracture with routine healing: Secondary | ICD-10-CM | POA: Diagnosis not present

## 2017-06-19 ENCOUNTER — Non-Acute Institutional Stay (SKILLED_NURSING_FACILITY): Payer: Medicare Other | Admitting: Internal Medicine

## 2017-06-19 ENCOUNTER — Encounter: Payer: Self-pay | Admitting: Internal Medicine

## 2017-06-19 DIAGNOSIS — M810 Age-related osteoporosis without current pathological fracture: Secondary | ICD-10-CM

## 2017-06-19 DIAGNOSIS — F0281 Dementia in other diseases classified elsewhere with behavioral disturbance: Secondary | ICD-10-CM | POA: Diagnosis not present

## 2017-06-19 DIAGNOSIS — M545 Low back pain, unspecified: Secondary | ICD-10-CM

## 2017-06-19 DIAGNOSIS — F323 Major depressive disorder, single episode, severe with psychotic features: Secondary | ICD-10-CM | POA: Diagnosis not present

## 2017-06-19 DIAGNOSIS — K581 Irritable bowel syndrome with constipation: Secondary | ICD-10-CM | POA: Diagnosis not present

## 2017-06-19 DIAGNOSIS — R2689 Other abnormalities of gait and mobility: Secondary | ICD-10-CM

## 2017-06-19 DIAGNOSIS — G8929 Other chronic pain: Secondary | ICD-10-CM

## 2017-06-19 DIAGNOSIS — S62102A Fracture of unspecified carpal bone, left wrist, initial encounter for closed fracture: Secondary | ICD-10-CM

## 2017-06-19 DIAGNOSIS — C679 Malignant neoplasm of bladder, unspecified: Secondary | ICD-10-CM | POA: Diagnosis not present

## 2017-06-19 DIAGNOSIS — G3183 Dementia with Lewy bodies: Secondary | ICD-10-CM

## 2017-06-19 DIAGNOSIS — F02818 Dementia in other diseases classified elsewhere, unspecified severity, with other behavioral disturbance: Secondary | ICD-10-CM

## 2017-06-19 DIAGNOSIS — G20 Parkinson's disease: Secondary | ICD-10-CM | POA: Diagnosis not present

## 2017-06-19 DIAGNOSIS — G20A1 Parkinson's disease without dyskinesia, without mention of fluctuations: Secondary | ICD-10-CM

## 2017-06-19 NOTE — Progress Notes (Signed)
Patient ID: Kristina Carr, female   DOB: 12/02/1945, 72 y.o.   MRN: [id number]  Provider:  Rexene Edison. Mariea Clonts, D.O., C.M.D. Location:  [facility] Room Number: [place] of Service:  SNF (31)  PCP: Gayland Curry, DO Patient Care Team: Gayland Curry, DO as PCP - General (Geriatric Medicine) Gaynelle Arabian, MD as Consulting Physician (Orthopedic Surgery) Ricki Rodriguez, MD as Attending Physician (Psychiatry) Martinique, Amy, MD as Consulting Physician (Dermatology) Rana Snare, MD as Consulting Physician (Urology) Nyoka Cowden Stephannie Li, PA-C as Consulting Physician (Neurology)  Extended Emergency Contact Information Primary Emergency Contact: Arta Bruce Address: [address]          [place], [place] [address] Johnnette Litter of [place] Phone: [phone] Work Phone: [phone] Mobile Phone: [phone] Relation: Spouse Secondary Emergency Contact: Deatra James, [place] Mobile Phone: [phone] Relation: Son  Code Status: DNR, MOST Goals of Care: Advanced Directive information Advanced Directives 06/19/2017  Does Patient Have a Medical Advance Directive? Yes  Type of Paramedic of [place];Out of facility DNR (pink MOST or yellow form)  Does patient want to make changes to medical advance directive? No - Patient declined  Copy of [place] in Chart? Yes  Would patient like information on creating a medical advance directive? -  Pre-existing out of facility DNR order (yellow form or pink MOST form) Yellow form placed in chart (order not valid for inpatient use);Pink MOST form placed in chart (order not valid for inpatient use)  MOST form from 2018 was already on file.  Pt did NOT want a feeding tube.  New MOST was done with her husband at admission which now says feeding tube for a defined trial period BUT comfort measures instead of  limited additional interventions.  This will need clarification.  Given patient was much more capable of participating in the discussion last year, it seems the old MOST would take precedent for her feeding tube preferences.  Chief Complaint  Patient presents with  . New Admit To SNF    Memory Care Admission    HPI: Patient is a 72 y.o. female seen today for admission to [facility] from [place] due to increased hallucinations and delusional thoughts related to per progressive Lewy Body Dementia.  She has a h/o Parkinson's disease, falls, IBS with constipation, previously treated bladder cancer, chronic low back pain, and depression.  She's had vivid hallucinations for several years now including those of animals surrounding her in her home--she would block them out the best she could.  She has not slept well.  She more recently was having delusional thoughts that her husband had a whole separate family and was going to leave her.  She was become more irritable and combative.  We'd (pt's husband and I) been discussing changes in level of care for some time now, but pt's sister had reached out to neurology at [facility] asking about hospice care and that's, apparently, how the transition to memory care actually started.  There had been a referral placed to [facility] and palliative [facility] that I was unaware of until I was writing this note.    Prior to this admission, she lived with him in one of the [place].  She was followed by neurology, Dr. Tonye Royalty, at [facility] for her Lewy Body and I've provided her primary care. She did have in home assistance  for 8 hrs 3-4 days per week.   This had actually been expanded to 20 hrs per day.  At her AWV on 11/28/16, her MMSE dropped 5 more points from March of last year and she scored high for depression.  She recently sustained a fall with left wrist pain while in [place].  She was at soe point placed in a cast and sling and apparently has some orthopedic f/u,  but none of this is in our system.  She will receive continued therapy here in memory care.  She is gradually adjusting.  Pt's sister and a friend are visiting with her.  There are concerns that her PD meds (sinemet) must be given on time due tmo wear-off which gets severe for her.  She has had some flare-up of her abdominal pain recently, too, but is adjusting to her new location and also was not getting her routine bran cereal each morning.    Her weight loss has continued to progress: I first saw her in 3/17, she weighed 156, and now she is 142.  Much of this weight was lost between Feb and October of last year.  Past Medical History:  Diagnosis Date  . Abdominal pain, epigastric   . Abnormality of gait   . Anxiety state, unspecified   . Arthritis   . Bladder cancer ([place]) 2011   DR. GRAPEY  . Cervicalgia   . Detrusor instability   . Esophageal reflux   . Flatulence, eructation, and gas pain   . Hiatal hernia   . History of recurrent UTIs   . Irritable bowel syndrome   . Lewy body dementia   . Lumbago   . Nonspecific elevation of levels of transaminase or lactic acid dehydrogenase (LDH)   . Other abnormal glucose   . Other and unspecified hyperlipidemia   . Other and unspecified hyperlipidemia   . Other chest pain   . Other malaise and fatigue   . Pain in joint, site unspecified   . Palpitations   . Parkinson's disease    Dr Tonye Royalty, [facility]  . Personal history of other disorders of nervous system and sense organs   . PONV (postoperative nausea and vomiting)   . Restless leg syndrome   . Vitamin D deficiency    Past Surgical History:  Procedure Laterality Date  . APPENDECTOMY    . BLADDER TUMOR EXCISION    . BREAST SURGERY     Biopsy-benign  . BUNIONECTOMY     left  . CESAREAN SECTION    . CHEMOTHERAPY     FLUSH-CANCER EXCISED CYSTOSCOPICALLY FOLLOWED BY INSTALLATION OF CHEMOTHERAPY.  . COLONOSCOPY  2011   neg; Dr Sharlett Iles  . LUMBAR LAMINECTOMY     X 3; last @ age34  .  TOTAL HIP ARTHROPLASTY Left 04/12/2012   Procedure: TOTAL HIP ARTHROPLASTY ANTERIOR APPROACH;  Surgeon: Gearlean Alf, MD;  Location: [facility];  Service: Orthopedics;  Laterality: Left;  . TRIGGER FINGER RELEASE     X2  . UPPER GI ENDOSCOPY     hiatal hernia    reports that she has never smoked. She has never used smokeless tobacco. She reports that she does not drink alcohol or use drugs. Social History   Socioeconomic History  . Marital status: Married    Spouse name: Not on file  . Number of children: 1  . Years of education: Not on file  . Highest education level: Not on file  Occupational History  . Occupation: retired  Employer: UNEMPLOYED    Comment: [employer]  Social Needs  . Financial resource strain: Not on file  . Food insecurity:    Worry: Not on file    Inability: Not on file  . Transportation needs:    Medical: Not on file    Non-medical: Not on file  Tobacco Use  . Smoking status: Never Smoker  . Smokeless tobacco: Never Used  . Tobacco comment: social  Substance and Sexual Activity  . Alcohol use: No  . Drug use: No  . Sexual activity: Yes    Birth control/protection: Post-menopausal  Lifestyle  . Physical activity:    Days per week: Not on file    Minutes per session: Not on file  . Stress: Not on file  Relationships  . Social connections:    Talks on phone: Not on file    Gets together: Not on file    Attends religious service: Not on file    Active member of club or organization: Not on file    Attends meetings of clubs or organizations: Not on file    Relationship status: Not on file  . Intimate partner violence:    Fear of current or ex partner: Not on file    Emotionally abused: Not on file    Physically abused: Not on file    Forced sexual activity: Not on file  Other Topics Concern  . Not on file  Social History Narrative   Lives at [facility]    Married -[name]   Never smoked   Alcohol none   Exercise none   POA     Functional Status Survey:    Family History  Problem Relation Age of Onset  . Lung cancer Father   . Colon cancer Mother   . Hypertension Mother   . Breast cancer Mother 61  . Stomach cancer Maternal Grandmother   . Osteoporosis Maternal Grandmother   . Breast cancer Maternal Aunt 30  . Prostate cancer Paternal Grandfather   . Prostate cancer Unknown        Paternal Great Grandfather  . Prostate cancer Maternal Grandfather   . Colon polyps Sister   . Diabetes Neg Hx   . Stroke Neg Hx   . Heart attack Neg Hx     Health Maintenance  Topic Date Due  . Hepatitis C Screening  11-22-1945  . MAMMOGRAM  04/27/2017  . INFLUENZA VACCINE  09/06/2017  . TETANUS/TDAP  07/21/2018  . COLONOSCOPY  03/21/2020  . DEXA SCAN  Completed  . PNA vac Low Risk Adult  Completed    Allergies  Allergen Reactions  . Omeprazole     REACTION: HIVES Because of a history of documented adverse serious drug reaction;Medi Alert bracelet  is recommended  . Simvastatin     REACTION: LEG CRAMPS  . Venlafaxine     REACTION: nausea    Outpatient Encounter Medications as of 06/19/2017  Medication Sig  . acetaminophen (TYLENOL) 325 MG tablet Take by mouth.  . carbidopa-levodopa (SINEMET IR) 25-100 MG per tablet Take 2 tablets by mouth 4 (four) times daily.   Marland Kitchen donepezil (ARICEPT) 10 MG tablet Take 10 mg at bedtime by mouth.   Marland Kitchen HYDROcodone-acetaminophen (NORCO/VICODIN) 5-325 MG tablet Take 0.5 tablets by mouth every 4 (four) hours as needed for moderate pain.  . Melatonin 3 MG TABS Take 6 mg by mouth at bedtime.  Marland Kitchen PARoxetine (PAXIL) 20 MG tablet Take 40 mg by mouth every morning.   Marland Kitchen QUEtiapine (  SEROQUEL) 50 MG tablet Take 100 mg by mouth at bedtime.   . [DISCONTINUED] acetaminophen (TYLENOL) 500 MG tablet Take 1 tablet (500 mg total) by mouth every 8 (eight) hours as needed.  . [DISCONTINUED] Cholecalciferol (VITAMIN D) 2000 units CAPS Take 1 capsule (2,000 Units total) by mouth daily.  .  [DISCONTINUED] RABEprazole (ACIPHEX) 20 MG tablet Take 1 tablet (20 mg total) by mouth daily.   No facility-administered encounter medications on file as of 06/19/2017.     Review of Systems  Constitutional: Positive for appetite change, fatigue and unexpected weight change. Negative for activity change, chills and fever.  HENT: Negative for congestion, hearing loss and trouble swallowing.   Eyes: Negative for visual disturbance.  Respiratory: Negative for chest tightness and shortness of breath.   Cardiovascular: Negative for chest pain, palpitations and leg swelling.  Gastrointestinal: Positive for abdominal pain and constipation. Negative for abdominal distention, blood in stool, diarrhea and nausea.  Genitourinary: Positive for frequency. Negative for dysuria.       Incontinence at times  Musculoskeletal: Positive for back pain and gait problem.       Left wrist pain  Skin: Negative for color change.  Neurological: Positive for tremors. Negative for dizziness.       Unsteady gait; sling for wrist fracture  Hematological: Bruises/bleeds easily.  Psychiatric/Behavioral: Positive for agitation, behavioral problems, confusion, hallucinations and sleep disturbance. Negative for suicidal ideas. The patient is not nervous/anxious.        Agitation and behavior and sleep disturbances at home, not mentioned by staff thus far    Vitals:   06/19/17 1104  BP: 121/69  Pulse: 65  Resp: 20  Temp: (!) 97.4 F (36.3 C)  TempSrc: Oral  SpO2: 97%  Weight: 142 lb (64.4 kg)  Height: 5\' 4"  (1.626 m)   Body mass index is 24.37 kg/m. Physical Exam  Constitutional: No distress.  Thin female  HENT:  Head: Normocephalic and atraumatic.  Right Ear: External ear normal.  Left Ear: External ear normal.  Nose: Nose normal.  Mouth/Throat: Oropharynx is clear and moist. No oropharyngeal exudate.  Eyes: Pupils are equal, round, and reactive to light. Conjunctivae and EOM are normal.  Neck: Neck  supple. No JVD present.  Pulmonary/Chest: Effort normal and breath sounds normal. No respiratory distress.  Abdominal: Soft. Bowel sounds are normal. She exhibits no distension and no mass. There is no tenderness. There is no guarding.  Musculoskeletal:  Stooped posture, still ambulating w/o assistive device, left arm sling in place, festinating gait  Lymphadenopathy:    She has no cervical adenopathy.  Neurological: She is alert.  Oriented to person--knows self, sister, friend, me, also to [place], but not specific location  Skin: Skin is warm and dry.  Psychiatric:  Down affect, appears fatigued with dark circles beneath eyes moreso than baseline    Labs reviewed: Basic Metabolic Panel: Recent Labs    07/04/16 0800  NA 142  K 4.3  BUN 18  CREATININE 0.9   Liver Function Tests: Recent Labs    07/04/16 0800  AST 10*  ALT 15  ALKPHOS 60   No results for input(s): LIPASE, AMYLASE in the last 8760 hours. No results for input(s): AMMONIA in the last 8760 hours. CBC: Recent Labs    07/04/16  WBC 6.5  HGB 14.2  HCT 44  PLT 297   Cardiac Enzymes: No results for input(s): CKTOTAL, CKMB, CKMBINDEX, TROPONINI in the last 8760 hours. BNP: Invalid input(s): POCBNP Lab Results  Component Value Date   HGBA1C 6.0 02/26/2012   Lab Results  Component Value Date   TSH 1.43 10/17/2012   Lab Results  Component Value Date   DXIPJASN05 397 10/17/2012   Lab Results  Component Value Date   FOLATE > 20.0 ng/mL 04/26/2007   Lab Results  Component Value Date   IRON 127 04/26/2007    Imaging and Procedures obtained prior to SNF admission: Need information from orthopedics about shoulder for chart  Assessment/Plan 1. Lewy body dementia with behavioral disturbance -had previously been w/o behavioral disturbance until recently when her delusions about her husband having an extra family or affair began and she started to yell in a shrill voice at home -none of this  reported here in memory care thus far -at one point, her requip was d/cd by her neurologist thinking this was causing the delusional state, but it did not make a difference (progression of disease) -remains on seroquel at bedtime which has been in place long-term and melatonin for sleep -her behavior was getting out of control and she required skilled care so she was admitted to memory care from home where she had caregivers  2. PARKINSON'S DISEASE -longstanding, as well, with progressive gait dysfunction at this point and more falls -cont sinemet 2 tabs qid--MUST BE GIVEN ON TIME to prevent significant wear-off which increases her fatigue and distress  3. Irritable bowel syndrome with constipation -does well if she has significant fiber in her diet and must be encouraged to hydrate -was having a bran cereal at home each morning  -has had a thorough workup for her abdominal pain which she gets when her constipation is out of control and also correlates at times to her depression and stress  4. Chronic midline low back pain without sciatica -also longstanding -limits distances walking -has been on tylenol for this--oddly the frequently has disappeared from the system  5. Festinating gait -ongoing, will work with PT, OT here  6. Malignant neoplasm of urinary bladder, unspecified site [facility]) -historically treated with resection and local chemo, no recurrence found on multiple occasions at f/u despite her urinary frequency, urgency and incontinence--these are due to her progressive dementia (functional)  7. Severe single current episode of major depressive disorder, with psychotic features ([place]) -longstanding as well -likely contributor to her delusional thoughts along with her lewy body -spirits have been poor for at least 2 years that I've known her due to her diagnoses -continue paxil therapy -choices of medications limited for psychosis and depression due to lewy body dementia  8.  Closed fracture of left wrist, initial encounter -apparently, there was a fracture with her fall in [place], but I have no records to review -has a sling in place and to work with therapy -apparently will also need f/u with orthopedics due to cast -was given norco for pain--would only use this when necessary and try to use the tylenol due to cognitive effects of opioids -clearly, we will treat her if she hurts  9.  Senile osteoporosis -wrist fx confirmatory -is on comfort meds only at this point and family was discussing hospice, so would not add vitamin D to her regimen now--I know I've recommended it in the past, but it never got started while she lived at home  Family/ staff Communication: spoke with pt's sister and her friend who were present at time of visit  Labs/tests ordered:  No new  Elgene Coral L. Mintie Witherington, D.O. [facility] [address], [place] [address] Cell Phone (Mon-Fri 8am-5pm):  [phone] On Call:  [phone] & follow prompts after 5pm & weekends Office Phone:  [phone] Office Fax:  [phone]

## 2017-06-27 DIAGNOSIS — M62532 Muscle wasting and atrophy, not elsewhere classified, left forearm: Secondary | ICD-10-CM | POA: Diagnosis not present

## 2017-06-27 DIAGNOSIS — W108XXA Fall (on) (from) other stairs and steps, initial encounter: Secondary | ICD-10-CM | POA: Diagnosis not present

## 2017-06-27 DIAGNOSIS — R2689 Other abnormalities of gait and mobility: Secondary | ICD-10-CM | POA: Diagnosis not present

## 2017-06-27 DIAGNOSIS — G3183 Dementia with Lewy bodies: Secondary | ICD-10-CM | POA: Diagnosis not present

## 2017-06-27 DIAGNOSIS — Z4789 Encounter for other orthopedic aftercare: Secondary | ICD-10-CM | POA: Diagnosis not present

## 2017-06-27 DIAGNOSIS — M24832 Other specific joint derangements of left wrist, not elsewhere classified: Secondary | ICD-10-CM | POA: Diagnosis not present

## 2017-06-27 DIAGNOSIS — G20 Parkinson's disease: Secondary | ICD-10-CM | POA: Diagnosis not present

## 2017-06-27 DIAGNOSIS — R2681 Unsteadiness on feet: Secondary | ICD-10-CM | POA: Diagnosis not present

## 2017-06-27 DIAGNOSIS — M25532 Pain in left wrist: Secondary | ICD-10-CM | POA: Diagnosis not present

## 2017-06-28 DIAGNOSIS — R2681 Unsteadiness on feet: Secondary | ICD-10-CM | POA: Diagnosis not present

## 2017-06-28 DIAGNOSIS — G20 Parkinson's disease: Secondary | ICD-10-CM | POA: Diagnosis not present

## 2017-06-28 DIAGNOSIS — M24832 Other specific joint derangements of left wrist, not elsewhere classified: Secondary | ICD-10-CM | POA: Diagnosis not present

## 2017-06-28 DIAGNOSIS — R2689 Other abnormalities of gait and mobility: Secondary | ICD-10-CM | POA: Diagnosis not present

## 2017-06-28 DIAGNOSIS — M62532 Muscle wasting and atrophy, not elsewhere classified, left forearm: Secondary | ICD-10-CM | POA: Diagnosis not present

## 2017-06-28 DIAGNOSIS — M25532 Pain in left wrist: Secondary | ICD-10-CM | POA: Diagnosis not present

## 2017-06-29 DIAGNOSIS — R2689 Other abnormalities of gait and mobility: Secondary | ICD-10-CM | POA: Diagnosis not present

## 2017-06-29 DIAGNOSIS — M62532 Muscle wasting and atrophy, not elsewhere classified, left forearm: Secondary | ICD-10-CM | POA: Diagnosis not present

## 2017-06-29 DIAGNOSIS — G20 Parkinson's disease: Secondary | ICD-10-CM | POA: Diagnosis not present

## 2017-06-29 DIAGNOSIS — M24832 Other specific joint derangements of left wrist, not elsewhere classified: Secondary | ICD-10-CM | POA: Diagnosis not present

## 2017-06-29 DIAGNOSIS — M25532 Pain in left wrist: Secondary | ICD-10-CM | POA: Diagnosis not present

## 2017-06-29 DIAGNOSIS — R2681 Unsteadiness on feet: Secondary | ICD-10-CM | POA: Diagnosis not present

## 2017-07-03 NOTE — ACP (Advance Care Planning) (Signed)
MOST form from 2018 was already on file.  Pt did NOT want a feeding tube.  New MOST was done with her husband at admission which now says feeding tube for a defined trial period BUT comfort measures instead of limited additional interventions.  This will need clarification.  Given patient was much more capable of participating in the discussion last year, it seems the old MOST would take precedent for her feeding tube preferences.

## 2017-07-04 DIAGNOSIS — R2689 Other abnormalities of gait and mobility: Secondary | ICD-10-CM | POA: Diagnosis not present

## 2017-07-04 DIAGNOSIS — M62532 Muscle wasting and atrophy, not elsewhere classified, left forearm: Secondary | ICD-10-CM | POA: Diagnosis not present

## 2017-07-04 DIAGNOSIS — M25532 Pain in left wrist: Secondary | ICD-10-CM | POA: Diagnosis not present

## 2017-07-04 DIAGNOSIS — R2681 Unsteadiness on feet: Secondary | ICD-10-CM | POA: Diagnosis not present

## 2017-07-04 DIAGNOSIS — M24832 Other specific joint derangements of left wrist, not elsewhere classified: Secondary | ICD-10-CM | POA: Diagnosis not present

## 2017-07-04 DIAGNOSIS — S52615D Nondisplaced fracture of left ulna styloid process, subsequent encounter for closed fracture with routine healing: Secondary | ICD-10-CM | POA: Diagnosis not present

## 2017-07-04 DIAGNOSIS — G20 Parkinson's disease: Secondary | ICD-10-CM | POA: Diagnosis not present

## 2017-07-04 DIAGNOSIS — S52532D Colles' fracture of left radius, subsequent encounter for closed fracture with routine healing: Secondary | ICD-10-CM | POA: Diagnosis not present

## 2017-07-05 DIAGNOSIS — R2689 Other abnormalities of gait and mobility: Secondary | ICD-10-CM | POA: Diagnosis not present

## 2017-07-05 DIAGNOSIS — M62532 Muscle wasting and atrophy, not elsewhere classified, left forearm: Secondary | ICD-10-CM | POA: Diagnosis not present

## 2017-07-05 DIAGNOSIS — G20 Parkinson's disease: Secondary | ICD-10-CM | POA: Diagnosis not present

## 2017-07-05 DIAGNOSIS — R2681 Unsteadiness on feet: Secondary | ICD-10-CM | POA: Diagnosis not present

## 2017-07-05 DIAGNOSIS — M25532 Pain in left wrist: Secondary | ICD-10-CM | POA: Diagnosis not present

## 2017-07-05 DIAGNOSIS — M24832 Other specific joint derangements of left wrist, not elsewhere classified: Secondary | ICD-10-CM | POA: Diagnosis not present

## 2017-07-06 DIAGNOSIS — M24832 Other specific joint derangements of left wrist, not elsewhere classified: Secondary | ICD-10-CM | POA: Diagnosis not present

## 2017-07-06 DIAGNOSIS — M25532 Pain in left wrist: Secondary | ICD-10-CM | POA: Diagnosis not present

## 2017-07-06 DIAGNOSIS — M62532 Muscle wasting and atrophy, not elsewhere classified, left forearm: Secondary | ICD-10-CM | POA: Diagnosis not present

## 2017-07-06 DIAGNOSIS — R2689 Other abnormalities of gait and mobility: Secondary | ICD-10-CM | POA: Diagnosis not present

## 2017-07-06 DIAGNOSIS — G20 Parkinson's disease: Secondary | ICD-10-CM | POA: Diagnosis not present

## 2017-07-06 DIAGNOSIS — R2681 Unsteadiness on feet: Secondary | ICD-10-CM | POA: Diagnosis not present

## 2017-07-09 DIAGNOSIS — M62532 Muscle wasting and atrophy, not elsewhere classified, left forearm: Secondary | ICD-10-CM | POA: Diagnosis not present

## 2017-07-09 DIAGNOSIS — G3183 Dementia with Lewy bodies: Secondary | ICD-10-CM | POA: Diagnosis not present

## 2017-07-09 DIAGNOSIS — W108XXA Fall (on) (from) other stairs and steps, initial encounter: Secondary | ICD-10-CM | POA: Diagnosis not present

## 2017-07-09 DIAGNOSIS — G20 Parkinson's disease: Secondary | ICD-10-CM | POA: Diagnosis not present

## 2017-07-09 DIAGNOSIS — M24832 Other specific joint derangements of left wrist, not elsewhere classified: Secondary | ICD-10-CM | POA: Diagnosis not present

## 2017-07-09 DIAGNOSIS — M25532 Pain in left wrist: Secondary | ICD-10-CM | POA: Diagnosis not present

## 2017-07-09 DIAGNOSIS — Z4789 Encounter for other orthopedic aftercare: Secondary | ICD-10-CM | POA: Diagnosis not present

## 2017-07-10 DIAGNOSIS — G20 Parkinson's disease: Secondary | ICD-10-CM | POA: Diagnosis not present

## 2017-07-10 DIAGNOSIS — W108XXA Fall (on) (from) other stairs and steps, initial encounter: Secondary | ICD-10-CM | POA: Diagnosis not present

## 2017-07-10 DIAGNOSIS — M62532 Muscle wasting and atrophy, not elsewhere classified, left forearm: Secondary | ICD-10-CM | POA: Diagnosis not present

## 2017-07-10 DIAGNOSIS — M25532 Pain in left wrist: Secondary | ICD-10-CM | POA: Diagnosis not present

## 2017-07-10 DIAGNOSIS — G3183 Dementia with Lewy bodies: Secondary | ICD-10-CM | POA: Diagnosis not present

## 2017-07-10 DIAGNOSIS — M24832 Other specific joint derangements of left wrist, not elsewhere classified: Secondary | ICD-10-CM | POA: Diagnosis not present

## 2017-07-11 DIAGNOSIS — G3183 Dementia with Lewy bodies: Secondary | ICD-10-CM | POA: Diagnosis not present

## 2017-07-11 DIAGNOSIS — W108XXA Fall (on) (from) other stairs and steps, initial encounter: Secondary | ICD-10-CM | POA: Diagnosis not present

## 2017-07-11 DIAGNOSIS — G20 Parkinson's disease: Secondary | ICD-10-CM | POA: Diagnosis not present

## 2017-07-11 DIAGNOSIS — M25532 Pain in left wrist: Secondary | ICD-10-CM | POA: Diagnosis not present

## 2017-07-11 DIAGNOSIS — M24832 Other specific joint derangements of left wrist, not elsewhere classified: Secondary | ICD-10-CM | POA: Diagnosis not present

## 2017-07-11 DIAGNOSIS — M62532 Muscle wasting and atrophy, not elsewhere classified, left forearm: Secondary | ICD-10-CM | POA: Diagnosis not present

## 2017-07-12 DIAGNOSIS — M24832 Other specific joint derangements of left wrist, not elsewhere classified: Secondary | ICD-10-CM | POA: Diagnosis not present

## 2017-07-12 DIAGNOSIS — M62532 Muscle wasting and atrophy, not elsewhere classified, left forearm: Secondary | ICD-10-CM | POA: Diagnosis not present

## 2017-07-12 DIAGNOSIS — M25532 Pain in left wrist: Secondary | ICD-10-CM | POA: Diagnosis not present

## 2017-07-12 DIAGNOSIS — W108XXA Fall (on) (from) other stairs and steps, initial encounter: Secondary | ICD-10-CM | POA: Diagnosis not present

## 2017-07-12 DIAGNOSIS — G3183 Dementia with Lewy bodies: Secondary | ICD-10-CM | POA: Diagnosis not present

## 2017-07-12 DIAGNOSIS — G20 Parkinson's disease: Secondary | ICD-10-CM | POA: Diagnosis not present

## 2017-07-17 DIAGNOSIS — W108XXA Fall (on) (from) other stairs and steps, initial encounter: Secondary | ICD-10-CM | POA: Diagnosis not present

## 2017-07-17 DIAGNOSIS — M25532 Pain in left wrist: Secondary | ICD-10-CM | POA: Diagnosis not present

## 2017-07-17 DIAGNOSIS — G20 Parkinson's disease: Secondary | ICD-10-CM | POA: Diagnosis not present

## 2017-07-17 DIAGNOSIS — M24832 Other specific joint derangements of left wrist, not elsewhere classified: Secondary | ICD-10-CM | POA: Diagnosis not present

## 2017-07-17 DIAGNOSIS — M62532 Muscle wasting and atrophy, not elsewhere classified, left forearm: Secondary | ICD-10-CM | POA: Diagnosis not present

## 2017-07-17 DIAGNOSIS — G3183 Dementia with Lewy bodies: Secondary | ICD-10-CM | POA: Diagnosis not present

## 2017-07-19 DIAGNOSIS — W108XXA Fall (on) (from) other stairs and steps, initial encounter: Secondary | ICD-10-CM | POA: Diagnosis not present

## 2017-07-19 DIAGNOSIS — M25532 Pain in left wrist: Secondary | ICD-10-CM | POA: Diagnosis not present

## 2017-07-19 DIAGNOSIS — M62532 Muscle wasting and atrophy, not elsewhere classified, left forearm: Secondary | ICD-10-CM | POA: Diagnosis not present

## 2017-07-19 DIAGNOSIS — M24832 Other specific joint derangements of left wrist, not elsewhere classified: Secondary | ICD-10-CM | POA: Diagnosis not present

## 2017-07-19 DIAGNOSIS — G3183 Dementia with Lewy bodies: Secondary | ICD-10-CM | POA: Diagnosis not present

## 2017-07-19 DIAGNOSIS — G20 Parkinson's disease: Secondary | ICD-10-CM | POA: Diagnosis not present

## 2017-07-23 DIAGNOSIS — M25532 Pain in left wrist: Secondary | ICD-10-CM | POA: Diagnosis not present

## 2017-07-23 DIAGNOSIS — S52532D Colles' fracture of left radius, subsequent encounter for closed fracture with routine healing: Secondary | ICD-10-CM | POA: Diagnosis not present

## 2017-07-23 DIAGNOSIS — S52615D Nondisplaced fracture of left ulna styloid process, subsequent encounter for closed fracture with routine healing: Secondary | ICD-10-CM | POA: Diagnosis not present

## 2017-07-24 DIAGNOSIS — M25532 Pain in left wrist: Secondary | ICD-10-CM | POA: Diagnosis not present

## 2017-07-24 DIAGNOSIS — G20 Parkinson's disease: Secondary | ICD-10-CM | POA: Diagnosis not present

## 2017-07-24 DIAGNOSIS — W108XXA Fall (on) (from) other stairs and steps, initial encounter: Secondary | ICD-10-CM | POA: Diagnosis not present

## 2017-07-24 DIAGNOSIS — M24832 Other specific joint derangements of left wrist, not elsewhere classified: Secondary | ICD-10-CM | POA: Diagnosis not present

## 2017-07-24 DIAGNOSIS — G3183 Dementia with Lewy bodies: Secondary | ICD-10-CM | POA: Diagnosis not present

## 2017-07-24 DIAGNOSIS — M62532 Muscle wasting and atrophy, not elsewhere classified, left forearm: Secondary | ICD-10-CM | POA: Diagnosis not present

## 2017-07-26 DIAGNOSIS — G20 Parkinson's disease: Secondary | ICD-10-CM | POA: Diagnosis not present

## 2017-07-26 DIAGNOSIS — M25532 Pain in left wrist: Secondary | ICD-10-CM | POA: Diagnosis not present

## 2017-07-26 DIAGNOSIS — G3183 Dementia with Lewy bodies: Secondary | ICD-10-CM | POA: Diagnosis not present

## 2017-07-26 DIAGNOSIS — M62532 Muscle wasting and atrophy, not elsewhere classified, left forearm: Secondary | ICD-10-CM | POA: Diagnosis not present

## 2017-07-26 DIAGNOSIS — M24832 Other specific joint derangements of left wrist, not elsewhere classified: Secondary | ICD-10-CM | POA: Diagnosis not present

## 2017-07-26 DIAGNOSIS — W108XXA Fall (on) (from) other stairs and steps, initial encounter: Secondary | ICD-10-CM | POA: Diagnosis not present

## 2017-07-27 DIAGNOSIS — G3183 Dementia with Lewy bodies: Secondary | ICD-10-CM | POA: Diagnosis not present

## 2017-07-27 DIAGNOSIS — G20 Parkinson's disease: Secondary | ICD-10-CM | POA: Diagnosis not present

## 2017-07-27 DIAGNOSIS — M62532 Muscle wasting and atrophy, not elsewhere classified, left forearm: Secondary | ICD-10-CM | POA: Diagnosis not present

## 2017-07-27 DIAGNOSIS — M24832 Other specific joint derangements of left wrist, not elsewhere classified: Secondary | ICD-10-CM | POA: Diagnosis not present

## 2017-07-27 DIAGNOSIS — W108XXA Fall (on) (from) other stairs and steps, initial encounter: Secondary | ICD-10-CM | POA: Diagnosis not present

## 2017-07-27 DIAGNOSIS — M25532 Pain in left wrist: Secondary | ICD-10-CM | POA: Diagnosis not present

## 2017-08-02 DIAGNOSIS — G3183 Dementia with Lewy bodies: Secondary | ICD-10-CM | POA: Diagnosis not present

## 2017-08-02 DIAGNOSIS — M24832 Other specific joint derangements of left wrist, not elsewhere classified: Secondary | ICD-10-CM | POA: Diagnosis not present

## 2017-08-02 DIAGNOSIS — W108XXA Fall (on) (from) other stairs and steps, initial encounter: Secondary | ICD-10-CM | POA: Diagnosis not present

## 2017-08-02 DIAGNOSIS — G20 Parkinson's disease: Secondary | ICD-10-CM | POA: Diagnosis not present

## 2017-08-02 DIAGNOSIS — M62532 Muscle wasting and atrophy, not elsewhere classified, left forearm: Secondary | ICD-10-CM | POA: Diagnosis not present

## 2017-08-02 DIAGNOSIS — M25532 Pain in left wrist: Secondary | ICD-10-CM | POA: Diagnosis not present

## 2017-08-03 DIAGNOSIS — G20 Parkinson's disease: Secondary | ICD-10-CM | POA: Diagnosis not present

## 2017-08-03 DIAGNOSIS — Z79899 Other long term (current) drug therapy: Secondary | ICD-10-CM | POA: Diagnosis not present

## 2017-08-03 DIAGNOSIS — G3183 Dementia with Lewy bodies: Secondary | ICD-10-CM | POA: Diagnosis not present

## 2017-08-03 DIAGNOSIS — F0281 Dementia in other diseases classified elsewhere with behavioral disturbance: Secondary | ICD-10-CM | POA: Diagnosis not present

## 2017-08-03 DIAGNOSIS — F0280 Dementia in other diseases classified elsewhere without behavioral disturbance: Secondary | ICD-10-CM | POA: Diagnosis not present

## 2017-08-06 DIAGNOSIS — M25532 Pain in left wrist: Secondary | ICD-10-CM | POA: Diagnosis not present

## 2017-08-06 DIAGNOSIS — W108XXA Fall (on) (from) other stairs and steps, initial encounter: Secondary | ICD-10-CM | POA: Diagnosis not present

## 2017-08-06 DIAGNOSIS — M62532 Muscle wasting and atrophy, not elsewhere classified, left forearm: Secondary | ICD-10-CM | POA: Diagnosis not present

## 2017-08-06 DIAGNOSIS — M24832 Other specific joint derangements of left wrist, not elsewhere classified: Secondary | ICD-10-CM | POA: Diagnosis not present

## 2017-08-06 DIAGNOSIS — G20 Parkinson's disease: Secondary | ICD-10-CM | POA: Diagnosis not present

## 2017-08-06 DIAGNOSIS — G3183 Dementia with Lewy bodies: Secondary | ICD-10-CM | POA: Diagnosis not present

## 2017-08-06 DIAGNOSIS — Z4789 Encounter for other orthopedic aftercare: Secondary | ICD-10-CM | POA: Diagnosis not present

## 2017-08-07 ENCOUNTER — Encounter: Payer: Self-pay | Admitting: Internal Medicine

## 2017-08-07 ENCOUNTER — Non-Acute Institutional Stay (SKILLED_NURSING_FACILITY): Payer: Medicare Other | Admitting: Internal Medicine

## 2017-08-07 DIAGNOSIS — F0281 Dementia in other diseases classified elsewhere with behavioral disturbance: Secondary | ICD-10-CM | POA: Diagnosis not present

## 2017-08-07 DIAGNOSIS — G20 Parkinson's disease: Secondary | ICD-10-CM | POA: Diagnosis not present

## 2017-08-07 DIAGNOSIS — G20A1 Parkinson's disease without dyskinesia, without mention of fluctuations: Secondary | ICD-10-CM

## 2017-08-07 DIAGNOSIS — S62102A Fracture of unspecified carpal bone, left wrist, initial encounter for closed fracture: Secondary | ICD-10-CM

## 2017-08-07 DIAGNOSIS — K581 Irritable bowel syndrome with constipation: Secondary | ICD-10-CM

## 2017-08-07 DIAGNOSIS — G3183 Dementia with Lewy bodies: Secondary | ICD-10-CM | POA: Diagnosis not present

## 2017-08-07 DIAGNOSIS — F02818 Dementia in other diseases classified elsewhere, unspecified severity, with other behavioral disturbance: Secondary | ICD-10-CM

## 2017-08-07 NOTE — Progress Notes (Signed)
Patient ID: Kristina Carr, female   DOB: [birth year], 72 y.o.   MRN: [id number]  Location:  [place] Room Number: [place] of Service:  SNF [phone]) Provider:   Gayland Curry, DO  Patient Care Team: Gayland Curry, DO as PCP - General (Geriatric Medicine) Gaynelle Arabian, MD as Consulting Physician (Orthopedic Surgery) Ricki Rodriguez, MD as Attending Physician (Psychiatry) Martinique, Amy, MD as Consulting Physician (Dermatology) Rana Snare, MD as Consulting Physician (Urology) Nyoka Cowden Stephannie Li, PA-C as Consulting Physician (Neurology)  Extended Emergency Contact Information Primary Emergency Contact: Kristina Carr Address: [address]          [place], [place] [address] Kristina Carr of [place] Phone: [phone] Work Phone: [phone] Mobile Phone: [phone] Relation: Spouse Secondary Emergency Contact: Kristina Carr, Kristina Carr States of [place] Mobile Phone: [phone] Relation: Son  Code Status:  DNR Goals of care: Advanced Directive information Advanced Directives 08/07/2017  Does Patient Have a Medical Advance Directive? Yes  Type of Paramedic of [place];Out of facility DNR (pink MOST or yellow form)  Does patient want to make changes to medical advance directive? No - Patient declined  Copy of [place] in Chart? Yes  Would patient like information on creating a medical advance directive? -  Pre-existing out of facility DNR order (yellow form or pink MOST form) Pink MOST form placed in chart (order not valid for inpatient use)     Chief Complaint  Patient presents with  . Acute Visit    ? hospice eligible, increase quetiapine    HPI:  Pt is a 72 y.o. female with Lewy body dementia, parkinson's disease, depression, falls, constipation, insomnia, h/o bladder cancer and low back pain seen today for an acute visit to evaluate if  eligible for hospice care and whether to increase quetiapine after visit with her neurologist who made these recommendations.  Reading the paper notes, however, he actually advised palliative care not hospice care. Also, I've reviewed [name]' weights since her move to memory care, and she's actually gained weight.  She remains depressed and delusional that her husband has left her.  We discussed that he has not left her, but she is staying with [place] here so she can get the support she needs.  He is visiting her regularly.  She just "doesn't like it".  She does participate in activities with encouragement.  She seems happier when interacting with others.  Her gait remains very unsteady.  She is still in her left wrist/forearm cast from her fracture.  When I saw her, her caregiver was helping her get her makeup on for the day.  Past Medical History:  Diagnosis Date  . Abdominal pain, epigastric   . Abnormality of gait   . Anxiety state, unspecified   . Arthritis   . Bladder cancer ([place]) 2011   DR. GRAPEY  . Cervicalgia   . Detrusor instability   . Esophageal reflux   . Flatulence, eructation, and gas pain   . Hiatal hernia   . History of recurrent UTIs   . Irritable bowel syndrome   . Lewy body dementia   . Lumbago   . Nonspecific elevation of levels of transaminase or lactic acid dehydrogenase (LDH)   . Other abnormal glucose   . Other and unspecified hyperlipidemia   . Other and unspecified hyperlipidemia   . Other chest pain   . Other  malaise and fatigue   . Pain in joint, site unspecified   . Palpitations   . Parkinson's disease    Dr Tonye Royalty, [facility]  . Personal history of other disorders of nervous system and sense organs   . PONV (postoperative nausea and vomiting)   . Restless leg syndrome   . Vitamin D deficiency    Past Surgical History:  Procedure Laterality Date  . APPENDECTOMY    . BLADDER TUMOR EXCISION    . BREAST SURGERY     Biopsy-benign  . BUNIONECTOMY     left  .  CESAREAN SECTION    . CHEMOTHERAPY     FLUSH-CANCER EXCISED CYSTOSCOPICALLY FOLLOWED BY INSTALLATION OF CHEMOTHERAPY.  . COLONOSCOPY  2011   neg; Dr Sharlett Iles  . LUMBAR LAMINECTOMY     X 3; last @ age34  . TOTAL HIP ARTHROPLASTY Left 04/12/2012   Procedure: TOTAL HIP ARTHROPLASTY ANTERIOR APPROACH;  Surgeon: Gearlean Alf, MD;  Location: [facility];  Service: Orthopedics;  Laterality: Left;  . TRIGGER FINGER RELEASE     X2  . UPPER GI ENDOSCOPY     hiatal hernia    Allergies  Allergen Reactions  . Omeprazole     REACTION: HIVES Because of a history of documented adverse serious drug reaction;Medi Alert bracelet  is recommended  . Simvastatin     REACTION: LEG CRAMPS  . Venlafaxine     REACTION: nausea    Outpatient Encounter Medications as of 08/07/2017  Medication Sig  . acetaminophen (TYLENOL) 325 MG tablet Take by mouth.  . carbidopa-levodopa (SINEMET IR) 25-100 MG per tablet Take 2 tablets by mouth 4 (four) times daily.   Marland Kitchen donepezil (ARICEPT) 10 MG tablet Take 10 mg at bedtime by mouth.   . Melatonin 3 MG TABS Take 6 mg by mouth at bedtime.  Marland Kitchen PARoxetine (PAXIL) 20 MG tablet Take 40 mg by mouth every morning.   Marland Kitchen QUEtiapine (SEROQUEL) 50 MG tablet Take 150 mg by mouth at bedtime.   . [DISCONTINUED] HYDROcodone-acetaminophen (NORCO/VICODIN) 5-325 MG tablet Take 0.5 tablets by mouth every 4 (four) hours as needed for moderate pain.  . [DISCONTINUED] RABEprazole (ACIPHEX) 20 MG tablet Take 1 tablet (20 mg total) by mouth daily.   No facility-administered encounter medications on file as of 08/07/2017.     Review of Systems  Constitutional: Positive for fatigue. Negative for activity change, appetite change, chills, fever and unexpected weight change.  HENT: Negative for congestion and trouble swallowing.   Eyes: Negative for visual disturbance.  Respiratory: Negative for chest tightness and shortness of breath.   Cardiovascular: Negative for chest pain, palpitations and leg  swelling.  Gastrointestinal: Positive for abdominal pain and constipation. Negative for abdominal distention, anal bleeding, blood in stool, diarrhea, nausea, rectal pain and vomiting.  Genitourinary: Negative for dysuria.  Musculoskeletal: Positive for gait problem.  Skin: Negative for color change.  Neurological: Positive for tremors. Negative for dizziness.  Psychiatric/Behavioral: Positive for hallucinations. Negative for sleep disturbance.       Delusions    Immunization History  Administered Date(s) Administered  . Influenza Whole 11/15/2001, 11/08/2009  . Influenza, High Dose Seasonal PF 12/22/2015, 11/24/2016  . Influenza,inj,Quad PF,6+ Mos 03/11/2014, 11/23/2014  . Influenza-Unspecified 10/07/2012  . Pneumococcal Conjugate-13 11/23/2014  . Pneumococcal Polysaccharide-23 11/24/2015  . Td 02/09/1992, 07/20/2008  . Zoster 11/14/2010   Pertinent  Health Maintenance Due  Topic Date Due  . MAMMOGRAM  04/27/2017  . INFLUENZA VACCINE  09/06/2017  . COLONOSCOPY  03/21/2020  .  DEXA SCAN  Completed  . PNA vac Low Risk Adult  Completed   Fall Risk  11/28/2016 07/12/2016 03/15/2016 11/24/2015 08/25/2015  Falls in the past year? Yes Yes No Yes Yes  Number falls in past yr: 2 or more 2 or more - 1 2 or more  Injury with Fall? No No - Yes -  Risk for fall due to : - - - - -   Functional Status Survey:  requires assistance with all ADLs now  Vitals:   08/07/17 1142  BP: (!) 148/90  Pulse: 70  Resp: 16  Temp: 98.5 F (36.9 C)  TempSrc: Oral  SpO2: 95%  Height: 5\' 4"  (1.626 m)   Body mass index is 24.37 kg/m. Physical Exam  Constitutional: No distress.  HENT:  Head: Normocephalic and atraumatic.  Cardiovascular: Normal rate, regular rhythm, normal heart sounds and intact distal pulses.  Pulmonary/Chest: Effort normal and breath sounds normal. No respiratory distress.  Abdominal: Soft. Bowel sounds are normal. She exhibits no distension. There is no tenderness.    Musculoskeletal: Normal range of motion.  Left forearm/wrist cast  Neurological: She is alert.  Oriented to person and place  Skin: Skin is warm and dry.  Psychiatric:  Tearful, talks that her husband left her    Labs reviewed: No results for input(s): NA, K, CL, CO2, GLUCOSE, BUN, CREATININE, CALCIUM, MG, PHOS in the last 8760 hours. No results for input(s): AST, ALT, ALKPHOS, BILITOT, PROT, ALBUMIN in the last 8760 hours. No results for input(s): WBC, NEUTROABS, HGB, HCT, MCV, PLT in the last 8760 hours. Lab Results  Component Value Date   TSH 1.43 10/17/2012   Lab Results  Component Value Date   HGBA1C 6.0 02/26/2012   Lab Results  Component Value Date   CHOL 193 07/04/2016   HDL 43 07/04/2016   LDLCALC 116 07/04/2016   LDLDIRECT 135.1 02/26/2012   TRIG 171 (A) 07/04/2016   CHOLHDL 5 02/26/2012    Assessment/Plan 1. Lewy body dementia with behavioral disturbance -has had more difficulty with delusions since just before her move to memory care -is already on 150mg  of seroquel nightly -I'm concerned that a higher dose may worsen her fall risk -there has been discussion about hospice, but I don't think she qualifies b/c she's gained weight since her move and more dedicated staff attention in the memory care environment  2. PARKINSON'S DISEASE -cont sinemet scheduled--faithful timely administration order written at last visit with me and reiterated by her neurologist at that visit  3. Irritable bowel syndrome with constipation -controlled if she gets her high fiber breakfast as ordered and some salads in her diet--otherwise gets abdominal pain and hard stools -push fluids also  4. Closed fracture of left wrist, initial encounter -continues with cast on left wrist -keep routine f/u with othopedics  Family/ staff Communication: discussed with nurse managers  Labs/tests ordered:  No new  Ysidro Ramsay L. Desmen Schoffstall, D.O. [facility] [address], [place] [address] Cell Phone (Mon-Fri 8am-5pm):  [phone] On Call:  [phone] & follow prompts after 5pm & weekends Office Phone:  [phone] Office Fax:  [phone]

## 2017-08-08 DIAGNOSIS — M25532 Pain in left wrist: Secondary | ICD-10-CM | POA: Diagnosis not present

## 2017-08-08 DIAGNOSIS — M24832 Other specific joint derangements of left wrist, not elsewhere classified: Secondary | ICD-10-CM | POA: Diagnosis not present

## 2017-08-08 DIAGNOSIS — G3183 Dementia with Lewy bodies: Secondary | ICD-10-CM | POA: Diagnosis not present

## 2017-08-08 DIAGNOSIS — W108XXA Fall (on) (from) other stairs and steps, initial encounter: Secondary | ICD-10-CM | POA: Diagnosis not present

## 2017-08-08 DIAGNOSIS — M62532 Muscle wasting and atrophy, not elsewhere classified, left forearm: Secondary | ICD-10-CM | POA: Diagnosis not present

## 2017-08-08 DIAGNOSIS — S52532D Colles' fracture of left radius, subsequent encounter for closed fracture with routine healing: Secondary | ICD-10-CM | POA: Diagnosis not present

## 2017-08-08 DIAGNOSIS — S52615D Nondisplaced fracture of left ulna styloid process, subsequent encounter for closed fracture with routine healing: Secondary | ICD-10-CM | POA: Diagnosis not present

## 2017-08-08 DIAGNOSIS — G20 Parkinson's disease: Secondary | ICD-10-CM | POA: Diagnosis not present

## 2017-08-10 DIAGNOSIS — G20 Parkinson's disease: Secondary | ICD-10-CM | POA: Diagnosis not present

## 2017-08-10 DIAGNOSIS — W108XXA Fall (on) (from) other stairs and steps, initial encounter: Secondary | ICD-10-CM | POA: Diagnosis not present

## 2017-08-10 DIAGNOSIS — G3183 Dementia with Lewy bodies: Secondary | ICD-10-CM | POA: Diagnosis not present

## 2017-08-10 DIAGNOSIS — M62532 Muscle wasting and atrophy, not elsewhere classified, left forearm: Secondary | ICD-10-CM | POA: Diagnosis not present

## 2017-08-10 DIAGNOSIS — M25532 Pain in left wrist: Secondary | ICD-10-CM | POA: Diagnosis not present

## 2017-08-10 DIAGNOSIS — M24832 Other specific joint derangements of left wrist, not elsewhere classified: Secondary | ICD-10-CM | POA: Diagnosis not present

## 2017-08-13 DIAGNOSIS — M24832 Other specific joint derangements of left wrist, not elsewhere classified: Secondary | ICD-10-CM | POA: Diagnosis not present

## 2017-08-13 DIAGNOSIS — M25532 Pain in left wrist: Secondary | ICD-10-CM | POA: Diagnosis not present

## 2017-08-13 DIAGNOSIS — G3183 Dementia with Lewy bodies: Secondary | ICD-10-CM | POA: Diagnosis not present

## 2017-08-13 DIAGNOSIS — W108XXA Fall (on) (from) other stairs and steps, initial encounter: Secondary | ICD-10-CM | POA: Diagnosis not present

## 2017-08-13 DIAGNOSIS — M62532 Muscle wasting and atrophy, not elsewhere classified, left forearm: Secondary | ICD-10-CM | POA: Diagnosis not present

## 2017-08-13 DIAGNOSIS — G20 Parkinson's disease: Secondary | ICD-10-CM | POA: Diagnosis not present

## 2017-08-15 DIAGNOSIS — G3183 Dementia with Lewy bodies: Secondary | ICD-10-CM | POA: Diagnosis not present

## 2017-08-15 DIAGNOSIS — W108XXA Fall (on) (from) other stairs and steps, initial encounter: Secondary | ICD-10-CM | POA: Diagnosis not present

## 2017-08-15 DIAGNOSIS — G20 Parkinson's disease: Secondary | ICD-10-CM | POA: Diagnosis not present

## 2017-08-15 DIAGNOSIS — M25532 Pain in left wrist: Secondary | ICD-10-CM | POA: Diagnosis not present

## 2017-08-15 DIAGNOSIS — M62532 Muscle wasting and atrophy, not elsewhere classified, left forearm: Secondary | ICD-10-CM | POA: Diagnosis not present

## 2017-08-15 DIAGNOSIS — M24832 Other specific joint derangements of left wrist, not elsewhere classified: Secondary | ICD-10-CM | POA: Diagnosis not present

## 2017-08-20 DIAGNOSIS — M24832 Other specific joint derangements of left wrist, not elsewhere classified: Secondary | ICD-10-CM | POA: Diagnosis not present

## 2017-08-20 DIAGNOSIS — M25532 Pain in left wrist: Secondary | ICD-10-CM | POA: Diagnosis not present

## 2017-08-20 DIAGNOSIS — G20 Parkinson's disease: Secondary | ICD-10-CM | POA: Diagnosis not present

## 2017-08-20 DIAGNOSIS — W108XXA Fall (on) (from) other stairs and steps, initial encounter: Secondary | ICD-10-CM | POA: Diagnosis not present

## 2017-08-20 DIAGNOSIS — M62532 Muscle wasting and atrophy, not elsewhere classified, left forearm: Secondary | ICD-10-CM | POA: Diagnosis not present

## 2017-08-20 DIAGNOSIS — G3183 Dementia with Lewy bodies: Secondary | ICD-10-CM | POA: Diagnosis not present

## 2017-08-21 ENCOUNTER — Non-Acute Institutional Stay: Payer: Medicare Other | Admitting: Nurse Practitioner

## 2017-08-21 ENCOUNTER — Telehealth: Payer: Self-pay

## 2017-08-21 ENCOUNTER — Encounter: Payer: Self-pay | Admitting: Nurse Practitioner

## 2017-08-21 ENCOUNTER — Telehealth: Payer: Self-pay | Admitting: Licensed Clinical Social Worker

## 2017-08-21 VITALS — BP 117/72 | HR 76 | Resp 16 | Wt 144.6 lb

## 2017-08-21 DIAGNOSIS — G20 Parkinson's disease: Secondary | ICD-10-CM

## 2017-08-21 DIAGNOSIS — F0281 Dementia in other diseases classified elsewhere with behavioral disturbance: Secondary | ICD-10-CM

## 2017-08-21 DIAGNOSIS — G3183 Dementia with Lewy bodies: Secondary | ICD-10-CM

## 2017-08-21 DIAGNOSIS — F02818 Dementia in other diseases classified elsewhere, unspecified severity, with other behavioral disturbance: Secondary | ICD-10-CM

## 2017-08-21 DIAGNOSIS — R269 Unspecified abnormalities of gait and mobility: Secondary | ICD-10-CM

## 2017-08-21 NOTE — Telephone Encounter (Signed)
Phone call received from patient's husband to schedule visit with Palliative Care. Visit scheduled for Thursday 08/23/17.

## 2017-08-21 NOTE — Telephone Encounter (Signed)
Palliative Care SW left a vm at patient's home to set up a visit.  Cell phone vm was full.

## 2017-08-21 NOTE — Consult Note (Addendum)
PALLIATIVE CARE CONSULT VISIT   PATIENT NAME: Kristina Carr DOB: 1945/02/17 MRN: [id number]  PRIMARY CARE PROVIDER:   Gayland Curry, DO  REFERRING PROVIDER:  Gayland Curry, DO [place], [place] [address]  RESPONSIBLE PARTY:  Mr. Kristina Carr (husband) cell:[phone] home [phone]   ASSESSMENT:    72 yo female with Parkinson's, Lewy body dementia with h/o hallucinations; gait disturbance with recent fall/left wrist fracture. Patient recently moved to memory care due to increased care needs, (approx 2 months ago) previously resided with husband. Patient unable to provide meaningful information or ROS due to her dementia. Per staff patient eats well and is able to feed herself, requires total assistance with ADL's; stand-by assistance for ambulation and is incontinent of B&B.    Impression/Recommendations  Parkinsons -followed by Dr. Tonye Royalty (neurology); last seen 08/03/17 -currently on sinemet QID  Lewy Body Dementia with hallucinations  -FAST 7A -patient with end stage dementia; unable to answer simple questions;; able to speak few intelligible words -quetiapine increased per Dr. Tonye Royalty on 08/03/08  Gait disturbance with recent fall/non-displaced left wrist fracture -fell prior to memory care admission: was living with husband in independent -Patient with splint and is seen by Ortho and has appointment 07/24/17 for follow-up -patient very high fall risk  ACP -DNR and MOST on chart   I spent 60 miinutes providing this consultation,  from to 3:30-4:00pm. More than 50% of the time in this consultation was spent coordinating communication.   Spoke at length to Husband (Kristina Carr at [phone]); some collateral information obtained. Discussed Palliative Care services etc.(approx 30 min) over the phone. Patient request face to fact to discuss Palliative care services etc. Violeta Gelinas NP to meet with husband on 07/24/17 .  HISTORY OF PRESENT ILLNESS:   Kristina Carr is a 72 y.o. year old female with multiple medical problems including Parkinson's, Lewy Body dementia, gait disturbance. Palliative Care was asked to help with ongoing symptom management; coordination of care; and address goals of care.  CODE STATUS: DNR/MOST   PPS: 30%  FAST 7A HOSPICE ELIGIBILITY/DIAGNOSIS: TBD  PAST MEDICAL HISTORY:  Past Medical History:  Diagnosis Date  . Abdominal pain, epigastric   . Abnormality of gait   . Anxiety state, unspecified   . Arthritis   . Bladder cancer ([place]) 2011   DR. GRAPEY  . Cervicalgia   . Detrusor instability   . Esophageal reflux   . Flatulence, eructation, and gas pain   . Hiatal hernia   . History of recurrent UTIs   . Irritable bowel syndrome   . Lewy body dementia   . Lumbago   . Nonspecific elevation of levels of transaminase or lactic acid dehydrogenase (LDH)   . Other abnormal glucose   . Other and unspecified hyperlipidemia   . Other and unspecified hyperlipidemia   . Other chest pain   . Other malaise and fatigue   . Pain in joint, site unspecified   . Palpitations   . Parkinson's disease    Dr Tonye Royalty, [facility]  . Personal history of other disorders of nervous system and sense organs   . PONV (postoperative nausea and vomiting)   . Restless leg syndrome   . Vitamin D deficiency     SOCIAL HX:  Social History   Tobacco Use  . Smoking status: Never Smoker  . Smokeless tobacco: Never Used  . Tobacco comment: social  Substance Use Topics  . Alcohol use: No  ALLERGIES:  Allergies  Allergen Reactions  . Omeprazole     REACTION: HIVES Because of a history of documented adverse serious drug reaction;Medi Alert bracelet  is recommended  . Simvastatin     REACTION: LEG CRAMPS  . Venlafaxine     REACTION: nausea     PERTINENT MEDICATIONS:  Outpatient Encounter Medications as of 08/21/2017  Medication Sig  . acetaminophen (TYLENOL) 325 MG tablet Take by mouth.  . carbidopa-levodopa (SINEMET IR)  25-100 MG per tablet Take 2 tablets by mouth 4 (four) times daily.   Marland Kitchen donepezil (ARICEPT) 10 MG tablet Take 10 mg at bedtime by mouth.   . Melatonin 3 MG TABS Take 6 mg by mouth at bedtime.  Marland Kitchen PARoxetine (PAXIL) 20 MG tablet Take 40 mg by mouth every morning.   Marland Kitchen QUEtiapine (SEROQUEL) 50 MG tablet Take 150 mg by mouth at bedtime.   . [DISCONTINUED] RABEprazole (ACIPHEX) 20 MG tablet Take 1 tablet (20 mg total) by mouth daily.   No facility-administered encounter medications on file as of 08/21/2017.     PHYSICAL EXAM:   General: NAD, frail appearing,  Cardiovascular: regular rate and rhythm Pulmonary: clear ant fields Abdomen: soft, nontender, + bowel sounds GU: no suprapubic tenderness Extremities: no edema, no clubbing; abnormal/unstedy gait Skin: no rashes Neurological: confused; unable to answer questions or provide meaningful information; otherwise nonfocal  Kristina Carr G Martinique, NP

## 2017-08-22 DIAGNOSIS — M62532 Muscle wasting and atrophy, not elsewhere classified, left forearm: Secondary | ICD-10-CM | POA: Diagnosis not present

## 2017-08-22 DIAGNOSIS — M25532 Pain in left wrist: Secondary | ICD-10-CM | POA: Diagnosis not present

## 2017-08-22 DIAGNOSIS — W108XXA Fall (on) (from) other stairs and steps, initial encounter: Secondary | ICD-10-CM | POA: Diagnosis not present

## 2017-08-22 DIAGNOSIS — M24832 Other specific joint derangements of left wrist, not elsewhere classified: Secondary | ICD-10-CM | POA: Diagnosis not present

## 2017-08-22 DIAGNOSIS — G20 Parkinson's disease: Secondary | ICD-10-CM | POA: Diagnosis not present

## 2017-08-22 DIAGNOSIS — G3183 Dementia with Lewy bodies: Secondary | ICD-10-CM | POA: Diagnosis not present

## 2017-08-23 ENCOUNTER — Other Ambulatory Visit: Payer: Medicare Other | Admitting: Licensed Clinical Social Worker

## 2017-08-23 ENCOUNTER — Non-Acute Institutional Stay: Payer: Medicare Other | Admitting: Internal Medicine

## 2017-08-23 DIAGNOSIS — S52532D Colles' fracture of left radius, subsequent encounter for closed fracture with routine healing: Secondary | ICD-10-CM | POA: Diagnosis not present

## 2017-08-23 DIAGNOSIS — S52615D Nondisplaced fracture of left ulna styloid process, subsequent encounter for closed fracture with routine healing: Secondary | ICD-10-CM | POA: Diagnosis not present

## 2017-08-24 DIAGNOSIS — W108XXA Fall (on) (from) other stairs and steps, initial encounter: Secondary | ICD-10-CM | POA: Diagnosis not present

## 2017-08-24 DIAGNOSIS — M62532 Muscle wasting and atrophy, not elsewhere classified, left forearm: Secondary | ICD-10-CM | POA: Diagnosis not present

## 2017-08-24 DIAGNOSIS — G3183 Dementia with Lewy bodies: Secondary | ICD-10-CM | POA: Diagnosis not present

## 2017-08-24 DIAGNOSIS — M25532 Pain in left wrist: Secondary | ICD-10-CM | POA: Diagnosis not present

## 2017-08-24 DIAGNOSIS — M24832 Other specific joint derangements of left wrist, not elsewhere classified: Secondary | ICD-10-CM | POA: Diagnosis not present

## 2017-08-24 DIAGNOSIS — G20 Parkinson's disease: Secondary | ICD-10-CM | POA: Diagnosis not present

## 2017-08-27 DIAGNOSIS — M62532 Muscle wasting and atrophy, not elsewhere classified, left forearm: Secondary | ICD-10-CM | POA: Diagnosis not present

## 2017-08-27 DIAGNOSIS — G3183 Dementia with Lewy bodies: Secondary | ICD-10-CM | POA: Diagnosis not present

## 2017-08-27 DIAGNOSIS — G20 Parkinson's disease: Secondary | ICD-10-CM | POA: Diagnosis not present

## 2017-08-27 DIAGNOSIS — M25532 Pain in left wrist: Secondary | ICD-10-CM | POA: Diagnosis not present

## 2017-08-27 DIAGNOSIS — W108XXA Fall (on) (from) other stairs and steps, initial encounter: Secondary | ICD-10-CM | POA: Diagnosis not present

## 2017-08-27 DIAGNOSIS — M24832 Other specific joint derangements of left wrist, not elsewhere classified: Secondary | ICD-10-CM | POA: Diagnosis not present

## 2017-08-30 ENCOUNTER — Non-Acute Institutional Stay (SKILLED_NURSING_FACILITY): Payer: Medicare Other | Admitting: Adult Health

## 2017-08-30 ENCOUNTER — Encounter: Payer: Self-pay | Admitting: Adult Health

## 2017-08-30 DIAGNOSIS — S52509A Unspecified fracture of the lower end of unspecified radius, initial encounter for closed fracture: Secondary | ICD-10-CM | POA: Insufficient documentation

## 2017-08-30 DIAGNOSIS — F0280 Dementia in other diseases classified elsewhere without behavioral disturbance: Secondary | ICD-10-CM

## 2017-08-30 DIAGNOSIS — S52532S Colles' fracture of left radius, sequela: Secondary | ICD-10-CM

## 2017-08-30 DIAGNOSIS — F5101 Primary insomnia: Secondary | ICD-10-CM | POA: Diagnosis not present

## 2017-08-30 DIAGNOSIS — G3183 Dementia with Lewy bodies: Secondary | ICD-10-CM

## 2017-08-30 DIAGNOSIS — M62532 Muscle wasting and atrophy, not elsewhere classified, left forearm: Secondary | ICD-10-CM | POA: Diagnosis not present

## 2017-08-30 DIAGNOSIS — G4700 Insomnia, unspecified: Secondary | ICD-10-CM | POA: Insufficient documentation

## 2017-08-30 DIAGNOSIS — W108XXA Fall (on) (from) other stairs and steps, initial encounter: Secondary | ICD-10-CM | POA: Diagnosis not present

## 2017-08-30 DIAGNOSIS — M25532 Pain in left wrist: Secondary | ICD-10-CM | POA: Diagnosis not present

## 2017-08-30 DIAGNOSIS — G20 Parkinson's disease: Secondary | ICD-10-CM | POA: Diagnosis not present

## 2017-08-30 DIAGNOSIS — M24832 Other specific joint derangements of left wrist, not elsewhere classified: Secondary | ICD-10-CM | POA: Diagnosis not present

## 2017-08-30 NOTE — Assessment & Plan Note (Signed)
Continue melatonin 3 mg qhs

## 2017-08-30 NOTE — Assessment & Plan Note (Signed)
Healed per ortho notes with Dr. Amedeo Plenty. Followed by OT and may wear brace when needed for comfort.

## 2017-08-30 NOTE — Assessment & Plan Note (Signed)
Continue sinemet 25-100 tid.

## 2017-08-30 NOTE — Progress Notes (Signed)
Location:  Occupational psychologist of Service:  SNF (31) Provider:   Cindi Carbon, ANP [place] [phone]   Gayland Curry, DO  Patient Care Team: Gayland Curry, DO as PCP - General (Geriatric Medicine) Gaynelle Arabian, MD as Consulting Physician (Orthopedic Surgery) Ricki Rodriguez, MD as Attending Physician (Psychiatry) Martinique, Amy, MD as Consulting Physician (Dermatology) Rana Snare, MD as Consulting Physician (Urology) Nyoka Cowden, Stephannie Li, PA-C as Consulting Physician (Neurology) Duffy, Creola Corn, LCSW as Social Worker (Licensed Clinical Social Worker)  Extended Emergency Contact Information Primary Emergency Contact: Arta Bruce Address: [address]          [place], [place] [address] [place] of [place] Phone: [phone] Work Phone: [phone] Mobile Phone: [phone] Relation: Spouse Secondary Emergency Contact: Deatra James, [place] of [employer] Phone: [phone] Relation: Son  Code Status:  DNR Goals of care: Advanced Directive information Advanced Directives 08/07/2017  Does Patient Have a Medical Advance Directive? Yes  Type of Paramedic of [place];Out of facility DNR (pink MOST or yellow form)  Does patient want to make changes to medical advance directive? No - Patient declined  Copy of [place] in Chart? Yes  Would patient like information on creating a medical advance directive? -  Pre-existing out of facility DNR order (yellow form or pink MOST form) Pink MOST form placed in chart (order not valid for inpatient use)     Chief Complaint  Patient presents with  . Medical Management of Chronic Issues    HPI:  Pt is a 72 y.o. female seen today for medical management of chronic diseases.   She resides in memory care as of May of 2019 due to advancing lewy body dementia.  LBD: remains ambulatory but needs  assistance with all ADLs.  Seroquel increased with noted improvement in delusions per staff note in matrix. For my visit she is tearful and feels her husband is ruining her life. The staff reported that she went to the dentist this morning and came back very agitated. MMSE 14/30 on 06/08/17 Followed by palliative care.   PD: remains on sinemet has significant extrapyramidal symptoms.  Previously followed by neurology at [facility]  Left radius and ulnar styloid fracture: healing per notes by Dr Amedeo Plenty. May remove brace and wear as needed. Continues to be followed by OT.    Insomnia: uses melatonin at night, no reports of sleep disturbance.   Fall risk with festinating gait: staff have applied a gait belt, remains without a walker it would not be helpful in this situation.   Past Medical History:  Diagnosis Date  . Abdominal pain, epigastric   . Abnormality of gait   . Anxiety state, unspecified   . Arthritis   . Bladder cancer ([place]) 2011   DR. GRAPEY  . Cervicalgia   . Detrusor instability   . Esophageal reflux   . Flatulence, eructation, and gas pain   . Hiatal hernia   . History of recurrent UTIs   . Irritable bowel syndrome   . Lewy body dementia   . Lumbago   . Nonspecific elevation of levels of transaminase or lactic acid dehydrogenase (LDH)   . Other abnormal glucose   . Other and unspecified hyperlipidemia   . Other and unspecified hyperlipidemia   . Other chest pain   . Other malaise and fatigue   . Pain in joint, site  unspecified   . Palpitations   . Parkinson's disease    Dr Tonye Royalty, [facility]  . Personal history of other disorders of nervous system and sense organs   . PONV (postoperative nausea and vomiting)   . Restless leg syndrome   . Vitamin D deficiency    Past Surgical History:  Procedure Laterality Date  . APPENDECTOMY    . BLADDER TUMOR EXCISION    . BREAST SURGERY     Biopsy-benign  . BUNIONECTOMY     left  . CESAREAN SECTION    . CHEMOTHERAPY      FLUSH-CANCER EXCISED CYSTOSCOPICALLY FOLLOWED BY INSTALLATION OF CHEMOTHERAPY.  . COLONOSCOPY  2011   neg; Dr Sharlett Iles  . LUMBAR LAMINECTOMY     X 3; last @ age34  . TOTAL HIP ARTHROPLASTY Left 04/12/2012   Procedure: TOTAL HIP ARTHROPLASTY ANTERIOR APPROACH;  Surgeon: Gearlean Alf, MD;  Location: [facility] [place] [facility] [place];  Service: Orthopedics;  Laterality: Left;  . TRIGGER FINGER RELEASE     X2  . UPPER GI ENDOSCOPY     hiatal hernia    Allergies  Allergen Reactions  . Omeprazole     REACTION: HIVES Because of a history of documented adverse serious drug reaction;Medi Alert bracelet  is recommended  . Simvastatin     REACTION: LEG CRAMPS  . Venlafaxine     REACTION: nausea    Outpatient Encounter Medications as of 08/30/2017  Medication Sig  . carbidopa-levodopa (SINEMET IR) 25-100 MG per tablet Take 2 tablets by mouth 4 (four) times daily.   Marland Kitchen donepezil (ARICEPT) 10 MG tablet Take 10 mg at bedtime by mouth.   . Melatonin 3 MG TABS Take 6 mg by mouth at bedtime.  Marland Kitchen PARoxetine (PAXIL) 20 MG tablet Take 40 mg by mouth every morning.   Marland Kitchen QUEtiapine (SEROQUEL) 50 MG tablet Take 150 mg by mouth at bedtime.   Marland Kitchen acetaminophen (TYLENOL) 325 MG tablet Take 650 mg by mouth 3 (three) times daily.   . [DISCONTINUED] RABEprazole (ACIPHEX) 20 MG tablet Take 1 tablet (20 mg total) by mouth daily.   No facility-administered encounter medications on file as of 08/30/2017.     Review of Systems  Unable to perform ROS: Dementia    Immunization History  Administered Date(s) Administered  . Influenza Whole 11/15/2001, 11/08/2009  . Influenza, High Dose Seasonal PF 12/22/2015, 11/24/2016  . Influenza,inj,Quad PF,6+ Mos 03/11/2014, 11/23/2014  . Influenza-Unspecified 10/07/2012  . Pneumococcal Conjugate-13 11/23/2014  . Pneumococcal Polysaccharide-23 11/24/2015  . Td 02/09/1992, 07/20/2008  . Zoster 11/14/2010   Pertinent  Health Maintenance Due  Topic Date Due  . INFLUENZA VACCINE  09/06/2017  .  COLONOSCOPY  03/21/2020  . DEXA SCAN  Completed  . PNA vac Low Risk Adult  Completed  . MAMMOGRAM  Discontinued   Fall Risk  11/28/2016 07/12/2016 03/15/2016 11/24/2015 08/25/2015  Falls in the past year? Yes Yes No Yes Yes  Number falls in past yr: 2 or more 2 or more - 1 2 or more  Injury with Fall? No No - Yes -  Risk for fall due to : - - - - -   Functional Status Survey:   Wt Readings from Last 3 Encounters:  08/30/17 144 lb 9.6 oz (65.6 kg)  08/21/17 144 lb 9.6 oz (65.6 kg)  06/19/17 142 lb (64.4 kg)    Vitals:   08/30/17 1437  Weight: 144 lb 9.6 oz (65.6 kg)   Body mass index is 24.82 kg/m. Physical Exam  Constitutional: No distress.  HENT:  Head: Normocephalic and atraumatic.  Neck: No JVD present.  Cardiovascular: Normal rate and regular rhythm.  No murmur heard. Pulmonary/Chest: Effort normal and breath sounds normal. No respiratory distress. She has no wheezes.  Abdominal: Soft. Bowel sounds are normal.  Musculoskeletal: She exhibits no edema or tenderness.  Neurological: She is alert.  Oriented to self only. Able to f/c. Festinating/unsteady gait noted.  EPS movements noted to trunk and arms.   Skin: Skin is warm and dry. She is not diaphoretic.  Psychiatric:  Tearful and anxious    Labs reviewed: No results for input(s): NA, K, CL, CO2, GLUCOSE, BUN, CREATININE, CALCIUM, MG, PHOS in the last 8760 hours. No results for input(s): AST, ALT, ALKPHOS, BILITOT, PROT, ALBUMIN in the last 8760 hours. No results for input(s): WBC, NEUTROABS, HGB, HCT, MCV, PLT in the last 8760 hours. Lab Results  Component Value Date   TSH 1.43 10/17/2012   Lab Results  Component Value Date   HGBA1C 6.0 02/26/2012   Lab Results  Component Value Date   CHOL 193 07/04/2016   HDL 43 07/04/2016   LDLCALC 116 07/04/2016   LDLDIRECT 135.1 02/26/2012   TRIG 171 (A) 07/04/2016   CHOLHDL 5 02/26/2012    Significant Diagnostic Results in last 30 days:  No results  found.  Assessment/Plan  Lewy body dementia without behavioral disturbance Progressive decline in cognition and function, necessitating a move to memory care in May. Continues on aricept. Behaviors are improved per nsg notes but she is quite anxious for my visit today. Continue to monitor the need for additional medication and dose reduction.   PARKINSON'S DISEASE Continue sinemet 25-100 tid.   Insomnia Continue melatonin 3 mg qhs  Distal radius fracture Healed per ortho notes with Dr. Amedeo Plenty. Followed by OT and may wear brace when needed for comfort.    Family/ staff Communication: staff/resident  Labs/tests ordered: CBC CMP A1C for drug therapy monitoring

## 2017-08-30 NOTE — Assessment & Plan Note (Signed)
Progressive decline in cognition and function, necessitating a move to memory care in May. Continues on aricept. Behaviors are improved per nsg notes but she is quite anxious for my visit today. Continue to monitor the need for additional medication and dose reduction.

## 2017-09-03 DIAGNOSIS — G3183 Dementia with Lewy bodies: Secondary | ICD-10-CM | POA: Diagnosis not present

## 2017-09-03 DIAGNOSIS — E119 Type 2 diabetes mellitus without complications: Secondary | ICD-10-CM | POA: Diagnosis not present

## 2017-09-03 DIAGNOSIS — G20 Parkinson's disease: Secondary | ICD-10-CM | POA: Diagnosis not present

## 2017-09-03 DIAGNOSIS — D649 Anemia, unspecified: Secondary | ICD-10-CM | POA: Diagnosis not present

## 2017-09-03 DIAGNOSIS — M25532 Pain in left wrist: Secondary | ICD-10-CM | POA: Diagnosis not present

## 2017-09-03 DIAGNOSIS — Z79899 Other long term (current) drug therapy: Secondary | ICD-10-CM | POA: Diagnosis not present

## 2017-09-03 DIAGNOSIS — W108XXA Fall (on) (from) other stairs and steps, initial encounter: Secondary | ICD-10-CM | POA: Diagnosis not present

## 2017-09-03 DIAGNOSIS — M62532 Muscle wasting and atrophy, not elsewhere classified, left forearm: Secondary | ICD-10-CM | POA: Diagnosis not present

## 2017-09-03 DIAGNOSIS — M24832 Other specific joint derangements of left wrist, not elsewhere classified: Secondary | ICD-10-CM | POA: Diagnosis not present

## 2017-09-03 LAB — BASIC METABOLIC PANEL
BUN: 25 — AB (ref 4–21)
CREATININE: 0.7 (ref 0.5–1.1)
Glucose: 100
POTASSIUM: 4.4 (ref 3.4–5.3)
Sodium: 143 (ref 137–147)

## 2017-09-03 LAB — CBC AND DIFFERENTIAL
HCT: 40 (ref 36–46)
Hemoglobin: 13.6 (ref 12.0–16.0)
Platelets: 264 (ref 150–399)
WBC: 4.8

## 2017-09-03 LAB — HEPATIC FUNCTION PANEL
ALK PHOS: 86 (ref 25–125)
ALT: 7 (ref 7–35)
AST: 21 (ref 13–35)
Bilirubin, Total: 0.2

## 2017-09-04 ENCOUNTER — Non-Acute Institutional Stay: Payer: Medicare Other | Admitting: *Deleted

## 2017-09-04 ENCOUNTER — Non-Acute Institutional Stay: Payer: Medicare Other | Admitting: Licensed Clinical Social Worker

## 2017-09-04 VITALS — BP 131/75 | HR 71 | Resp 16 | Wt 144.6 lb

## 2017-09-04 DIAGNOSIS — M25532 Pain in left wrist: Secondary | ICD-10-CM | POA: Diagnosis not present

## 2017-09-04 DIAGNOSIS — M24832 Other specific joint derangements of left wrist, not elsewhere classified: Secondary | ICD-10-CM | POA: Diagnosis not present

## 2017-09-04 DIAGNOSIS — Z515 Encounter for palliative care: Secondary | ICD-10-CM

## 2017-09-04 DIAGNOSIS — M62532 Muscle wasting and atrophy, not elsewhere classified, left forearm: Secondary | ICD-10-CM | POA: Diagnosis not present

## 2017-09-04 DIAGNOSIS — W108XXA Fall (on) (from) other stairs and steps, initial encounter: Secondary | ICD-10-CM | POA: Diagnosis not present

## 2017-09-04 DIAGNOSIS — G3183 Dementia with Lewy bodies: Secondary | ICD-10-CM | POA: Diagnosis not present

## 2017-09-04 DIAGNOSIS — G20 Parkinson's disease: Secondary | ICD-10-CM | POA: Diagnosis not present

## 2017-09-04 NOTE — Progress Notes (Signed)
COMMUNITY PALLIATIVE CARE SW NOTE  PATIENT NAME: Kristina Carr DOB: 11-09-1945 MRN: [id number]  PRIMARY CARE PROVIDER: Gayland Curry, DO  RESPONSIBLE PARTY:  Acct ID - Guarantor Home Phone Work Phone Relationship Acct Type  [id number] Kristina Carr, IACOVELLI* [phone] Self P/F     [place], [place], [place] [address]     PLAN OF CARE and INTERVENTIONS:             1. GOALS OF CARE/ ADVANCE CARE PLANNING:  Goal is for patient to remain in Memory Care.  Patient has a MOST form in her chart. 2. SOCIAL/EMOTIONAL/SPIRITUAL ASSESSMENT/ INTERVENTIONS:  SW and Palliative Care RN, Kristina Carr, met with patient and her private caregiver, Kristina Carr.  Patient was observed standing and walking independently.  Patient became tearful during the visit when discussing her husband.  She has expressed some paranoid thoughts recently about his relationship with others.  Patient was able to answer questions, but some of her answers were out of context.  Patient was born in [place].  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 In [place].  Kristina Carr met her husband, Kristina Carr, while working there.  They have one son, Kristina Carr, who lives in [place].  Patient has volunteered at [employer], where Kristina Carr attended.  She also volunteered for Kristina Carr.  Patient is of [employer].  Patient has resided on the memory care unit for about three months. 3. PATIENT/CAREGIVER EDUCATION/ COPING:  Patient has dementia.  She expresses her thoughts openly, although her thoughts may not be clear. 4. PERSONAL EMERGENCY PLAN:  Per facility protocol. 5. COMMUNITY RESOURCES COORDINATION/ HEALTH CARE NAVIGATION:  Patient has private caregivers. 6. FINANCIAL/LEGAL CONCERNS/INTERVENTIONS:  None per husband.     SOCIAL HX:  Social History   Tobacco Use  . Smoking status: Never Smoker  . Smokeless tobacco: Never Used  . Tobacco comment: social  Substance Use Topics  . Alcohol use: No    CODE STATUS:   DNR  ADVANCED DIRECTIVES:  Yes MOST FORM COMPLETE:  Yes HOSPICE EDUCATION PROVIDED:  No  PPS:  Intake is normal.  Patient stand and ambulates independently. Duration of visit and documentation:  75 minutes.      Kristina Corn Jameica Couts, LCSW

## 2017-09-05 NOTE — Progress Notes (Signed)
COMMUNITY PALLIATIVE CARE RN NOTE  PATIENT NAME: Kristina Carr DOB: 1945-11-15 MRN: [id number]  PRIMARY CARE PROVIDER: Gayland Curry, DO  RESPONSIBLE PARTY:  Acct ID - Guarantor Home Phone Work Phone Relationship Acct Type  [id number] [name], [name]* [phone] Self P/F     [place], [place], [place] [address]    PLAN OF CARE and INTERVENTION:  1. ADVANCE CARE PLANNING/GOALS OF CARE: Remain on Memory Care Unit, Avoid hospitalizations 2. PATIENT/CAREGIVER EDUCATION: Reinforced Safety/Fall Precautions 3. DISEASE STATUS: Joint visit made with [place]. Hired caregiver, Nira Conn present during visit and able to provide patient history/update. Patient currently resides at [facility] on the Memory Care Unit. Upon arrival, caregiver getting patient dressed. Patient appeared distressed and tearful. Caregiver was able to finish getting patient ready for the day and walk with patient to common area on unit. She became tearful several times throughout the visit, and at other times was able to answer some questions appropriately. Intermittent confusion. Caregiver states that patient had a "rough" weekend and has been more emotional than usual. Also exhibiting some paranoia. Caregiver able to redirect her most of the time. Rhythmic upper body movements noticed while patient is sitting. She has a Parkinson's diagnosis along with Lewy Body Dementia. Increased confusion towards the evening times. Caregiver hours are currently Mon through Sat from 8:30a to 5p through [facility]. Intake is good. Caregiver often takes patient off campus during the day. Patient is constantly moving in her seat or walking around. Caregiver takes patient for walks around the facility when she is more anxious. Intake is good. Takes medications whole and without any difficulties. Requires 1 person assistance with bathing and dressing. Patient is able to ambulate independently without  assistive devices and feeds herself.   HISTORY OF PRESENT ILLNESS: This is a 72 yo old female who currently resides on the Memory Care Unit at [facility]. Palliative Care to follow patient to assist with symptom management needs.    CODE STATUS: DNR ADVANCED DIRECTIVES: Y MOST FORM:  yes PPS: 40%   PHYSICAL EXAM:   VITALS: Today's Vitals   09/04/17 1022  BP: 131/75  Pulse: 71  Resp: 16  SpO2: 98%  Weight: 144 lb 9.6 oz (65.6 kg)  PainSc: 0-No pain    LUNGS: clear to auscultation  CARDIAC: Cor RRR EXTREMITIES: No edema SKIN: Exposed skin dry and intact  NEURO: Alert and oriented to self only, intermittent confusion, ambulatory   (Duration of visit and documentation 75 minutes)    Daryl Eastern, RN, BSN

## 2017-09-06 DIAGNOSIS — G3183 Dementia with Lewy bodies: Secondary | ICD-10-CM | POA: Diagnosis not present

## 2017-09-06 DIAGNOSIS — W108XXA Fall (on) (from) other stairs and steps, initial encounter: Secondary | ICD-10-CM | POA: Diagnosis not present

## 2017-09-06 DIAGNOSIS — G20 Parkinson's disease: Secondary | ICD-10-CM | POA: Diagnosis not present

## 2017-09-06 DIAGNOSIS — Z4789 Encounter for other orthopedic aftercare: Secondary | ICD-10-CM | POA: Diagnosis not present

## 2017-09-06 DIAGNOSIS — M25532 Pain in left wrist: Secondary | ICD-10-CM | POA: Diagnosis not present

## 2017-09-06 DIAGNOSIS — M62532 Muscle wasting and atrophy, not elsewhere classified, left forearm: Secondary | ICD-10-CM | POA: Diagnosis not present

## 2017-09-06 DIAGNOSIS — M24832 Other specific joint derangements of left wrist, not elsewhere classified: Secondary | ICD-10-CM | POA: Diagnosis not present

## 2017-09-07 DIAGNOSIS — A048 Other specified bacterial intestinal infections: Secondary | ICD-10-CM | POA: Diagnosis not present

## 2017-09-07 DIAGNOSIS — G4751 Confusional arousals: Secondary | ICD-10-CM | POA: Diagnosis not present

## 2017-09-07 DIAGNOSIS — R319 Hematuria, unspecified: Secondary | ICD-10-CM | POA: Diagnosis not present

## 2017-09-07 DIAGNOSIS — Z79899 Other long term (current) drug therapy: Secondary | ICD-10-CM | POA: Diagnosis not present

## 2017-09-07 DIAGNOSIS — N390 Urinary tract infection, site not specified: Secondary | ICD-10-CM | POA: Diagnosis not present

## 2017-09-09 DIAGNOSIS — G20 Parkinson's disease: Secondary | ICD-10-CM | POA: Diagnosis not present

## 2017-09-09 DIAGNOSIS — M25532 Pain in left wrist: Secondary | ICD-10-CM | POA: Diagnosis not present

## 2017-09-09 DIAGNOSIS — G3183 Dementia with Lewy bodies: Secondary | ICD-10-CM | POA: Diagnosis not present

## 2017-09-09 DIAGNOSIS — M24832 Other specific joint derangements of left wrist, not elsewhere classified: Secondary | ICD-10-CM | POA: Diagnosis not present

## 2017-09-09 DIAGNOSIS — M62532 Muscle wasting and atrophy, not elsewhere classified, left forearm: Secondary | ICD-10-CM | POA: Diagnosis not present

## 2017-09-09 DIAGNOSIS — W108XXA Fall (on) (from) other stairs and steps, initial encounter: Secondary | ICD-10-CM | POA: Diagnosis not present

## 2017-09-10 DIAGNOSIS — W108XXA Fall (on) (from) other stairs and steps, initial encounter: Secondary | ICD-10-CM | POA: Diagnosis not present

## 2017-09-10 DIAGNOSIS — G20 Parkinson's disease: Secondary | ICD-10-CM | POA: Diagnosis not present

## 2017-09-10 DIAGNOSIS — G3183 Dementia with Lewy bodies: Secondary | ICD-10-CM | POA: Diagnosis not present

## 2017-09-10 DIAGNOSIS — M24832 Other specific joint derangements of left wrist, not elsewhere classified: Secondary | ICD-10-CM | POA: Diagnosis not present

## 2017-09-10 DIAGNOSIS — M62532 Muscle wasting and atrophy, not elsewhere classified, left forearm: Secondary | ICD-10-CM | POA: Diagnosis not present

## 2017-09-10 DIAGNOSIS — M25532 Pain in left wrist: Secondary | ICD-10-CM | POA: Diagnosis not present

## 2017-09-11 DIAGNOSIS — G20 Parkinson's disease: Secondary | ICD-10-CM | POA: Diagnosis not present

## 2017-09-11 DIAGNOSIS — M25532 Pain in left wrist: Secondary | ICD-10-CM | POA: Diagnosis not present

## 2017-09-11 DIAGNOSIS — G3183 Dementia with Lewy bodies: Secondary | ICD-10-CM | POA: Diagnosis not present

## 2017-09-11 DIAGNOSIS — M24832 Other specific joint derangements of left wrist, not elsewhere classified: Secondary | ICD-10-CM | POA: Diagnosis not present

## 2017-09-11 DIAGNOSIS — M62532 Muscle wasting and atrophy, not elsewhere classified, left forearm: Secondary | ICD-10-CM | POA: Diagnosis not present

## 2017-09-11 DIAGNOSIS — W108XXA Fall (on) (from) other stairs and steps, initial encounter: Secondary | ICD-10-CM | POA: Diagnosis not present

## 2017-09-13 ENCOUNTER — Telehealth: Payer: Self-pay | Admitting: Licensed Clinical Social Worker

## 2017-09-13 ENCOUNTER — Non-Acute Institutional Stay: Payer: Medicare Other | Admitting: Licensed Clinical Social Worker

## 2017-09-13 ENCOUNTER — Non-Acute Institutional Stay: Payer: Medicare Other | Admitting: *Deleted

## 2017-09-13 VITALS — BP 113/79 | HR 82 | Resp 16 | Wt 144.4 lb

## 2017-09-13 DIAGNOSIS — Z515 Encounter for palliative care: Secondary | ICD-10-CM

## 2017-09-13 NOTE — Telephone Encounter (Signed)
Palliative Care SW attempted to contact patient's husband, [name].  His vm was full.  SW was scheduled to meet him at his home today at 9:30am and he was not home.

## 2017-09-13 NOTE — Progress Notes (Signed)
COMMUNITY PALLIATIVE CARE SW NOTE  PATIENT NAME: Kristina Carr DOB: 05/30/1945 MRN: [id number]  PRIMARY CARE PROVIDER: Gayland Curry, DO  RESPONSIBLE PARTY:  Acct ID - Guarantor Home Phone Work Phone Relationship Acct Type  [id number] Kristina Carr* [phone] Self P/F     [place], [place], [place] [address]     PLAN OF CARE and INTERVENTIONS:             1. GOALS OF CARE/ ADVANCE CARE PLANNING:  For patient to remain in Memory Care.  Patient has a MOST form indicating DNR. 2. SOCIAL/EMOTIONAL/SPIRITUAL ASSESSMENT/ INTERVENTIONS:  SW was scheduled to meet with patient's husband at his home.  He was not present and a vm could not be left on his phone.  SW met with patient at the facility.  Her private caregiver, Kristina Carr, was present.  Patient was alert and welcomed visit.  Kristina Carr reported patient had a whirlpool bath this morning and ate her breakfast.  Patient's speech was very rapid.  She would complete full sentences, but they were not always related to the subject being discussed.  Patient did not display any nonverbal indicators of pain. 3. PATIENT/CAREGIVER EDUCATION/ COPING:  SW has not met with patient's husband yet  Patient is verbal during visits and is able to indicate if she is in pain. 4. PERSONAL EMERGENCY PLAN:  Per facility protocol. 5.  COMMUNITY RESOURCES COORDINATION/ HEALTH CARE NAVIGATION:  Patient has private sitters through the facility. 6. FINANCIAL/LEGAL CONCERNS/INTERVENTIONS:  None     SOCIAL HX:  Social History   Tobacco Use  . Smoking status: Never Smoker  . Smokeless tobacco: Never Used  . Tobacco comment: social  Substance Use Topics  . Alcohol use: No    CODE STATUS:   DNR ADVANCED DIRECTIVES: Yes MOST FORM COMPLETE:  Yes HOSPICE EDUCATION PROVIDED:  No  PPS:  Patient's intake is normal.  She is able to stand and walk independently. Duration of visit and documentation:  60 minutes.      Creola Corn Maven Varelas, LCSW

## 2017-09-17 NOTE — Progress Notes (Signed)
COMMUNITY PALLIATIVE CARE RN NOTE  PATIENT NAME: Kristina Carr DOB: [birth year] MRN: [id number]  PRIMARY CARE PROVIDER: Gayland Curry, DO  RESPONSIBLE PARTY:  Acct ID - Guarantor Home Phone Work Phone Relationship Acct Type  [id number] Kristina Carr, Kristina Carr* [phone] Self P/F     [place], [place], [place] [address]    PLAN OF CARE and INTERVENTION:  1. ADVANCE CARE PLANNING/GOALS OF CARE: Remain at [facility], avoid hospitalizations 2. PATIENT/CAREGIVER EDUCATION: Reinforced Safety/Fall Precautions 3. DISEASE STATUS: Upon arrival, patient sitting in the unit living room on the couch watching TV with facility CNA. She is calm during this time. Intermittent confusion and word finding difficulties. She participated in a facility Olympic activity and stated that she won first place. She then observed another resident get up from her chair, so she went with her to walk around the unit. Hired sitter also present and began walking with patient. CNA reports continued tearful episodes along with some anxiety and at times is difficult to redirect. Intake is good, but she requires frequent prompting and directing. She is currently on Seroquel 150mg  BID to help with behaviors. CNA states that they get her in bed around 7:30/8:30p and she usually sleeps throughout the night. Will continue to monitor patient behaviors.  HISTORY OF PRESENT ILLNESS: This is a 72 yo female who resides on the Memory Care Unit at [facility]. Palliative Care to continue to follow patient.    CODE STATUS: DNR  ADVANCED DIRECTIVES: Y MOST FORM: no PPS: 40%   PHYSICAL EXAM:   VITALS: Today's Vitals   09/13/17 1531  BP: 113/79  Pulse: 82  Resp: 16  SpO2: 95%  Weight: 144 lb 6.4 oz (65.5 kg)  PainSc: 0-No pain    LUNGS: clear to auscultation  CARDIAC: Cor RRR EXTREMITIES: No edema SKIN: Exposed skin dry and intact  NEURO: Alert and oriented to self, intermittent confusion, ambulatory     (Duration of visit and documentation 60 minutes)    Daryl Eastern, RN, BSN

## 2017-09-19 ENCOUNTER — Non-Acute Institutional Stay: Payer: Medicare Other | Admitting: *Deleted

## 2017-09-19 VITALS — BP 108/80 | HR 83 | Resp 16

## 2017-09-19 DIAGNOSIS — G20 Parkinson's disease: Secondary | ICD-10-CM | POA: Diagnosis not present

## 2017-09-19 DIAGNOSIS — Z515 Encounter for palliative care: Secondary | ICD-10-CM

## 2017-09-19 DIAGNOSIS — M25532 Pain in left wrist: Secondary | ICD-10-CM | POA: Diagnosis not present

## 2017-09-19 DIAGNOSIS — M62532 Muscle wasting and atrophy, not elsewhere classified, left forearm: Secondary | ICD-10-CM | POA: Diagnosis not present

## 2017-09-19 DIAGNOSIS — W108XXA Fall (on) (from) other stairs and steps, initial encounter: Secondary | ICD-10-CM | POA: Diagnosis not present

## 2017-09-19 DIAGNOSIS — G3183 Dementia with Lewy bodies: Secondary | ICD-10-CM | POA: Diagnosis not present

## 2017-09-19 DIAGNOSIS — M24832 Other specific joint derangements of left wrist, not elsewhere classified: Secondary | ICD-10-CM | POA: Diagnosis not present

## 2017-09-21 DIAGNOSIS — M25532 Pain in left wrist: Secondary | ICD-10-CM | POA: Diagnosis not present

## 2017-09-21 DIAGNOSIS — M62532 Muscle wasting and atrophy, not elsewhere classified, left forearm: Secondary | ICD-10-CM | POA: Diagnosis not present

## 2017-09-21 DIAGNOSIS — G3183 Dementia with Lewy bodies: Secondary | ICD-10-CM | POA: Diagnosis not present

## 2017-09-21 DIAGNOSIS — M24832 Other specific joint derangements of left wrist, not elsewhere classified: Secondary | ICD-10-CM | POA: Diagnosis not present

## 2017-09-21 DIAGNOSIS — W108XXA Fall (on) (from) other stairs and steps, initial encounter: Secondary | ICD-10-CM | POA: Diagnosis not present

## 2017-09-21 DIAGNOSIS — G20 Parkinson's disease: Secondary | ICD-10-CM | POA: Diagnosis not present

## 2017-09-21 NOTE — Progress Notes (Signed)
COMMUNITY PALLIATIVE CARE RN NOTE  PATIENT NAME: Kristina Carr DOB: January 30, 1946 MRN: [id number]  PRIMARY CARE PROVIDER: Gayland Curry, DO  RESPONSIBLE PARTY:  Acct ID - Guarantor Home Phone Work Phone Relationship Acct Type  [id number] Kristina Carr, GOMBOS* [phone] Self P/F     [place], [place], [place] [address]    PLAN OF CARE and INTERVENTION:  1. ADVANCE CARE PLANNING/GOALS OF CARE: Remain on Memory Care Unit at [facility], avoid hospitalizations 2. PATIENT/CAREGIVER EDUCATION: Reinforced Safety/Fall Precautions 3. DISEASE STATUS: Patient sitting in living room area on unit with her husband. There are periods where she is sitting down conversing with her husband and at other times ambulating around the unit. Gait is unsteady at times. Wears gait belt at all times throughout the day. Continues with periods of tearfulness on and off throughout the day. CNA states that she can become very agitated and irritable when being told no to something she wants to do, even if unsafe. Pressured speech and word finding difficulties. Remains on Seroquel 150mg  po QHS. Urinalysis and culture obtained on 09/06/17 was negative for UTI. Intake is good, but she requires frequent directing and prompting. Requires assistance with all ADLs, but is able to feed herself. Occasional generalized tremors also. Will continue to monitor overall condition.   HISTORY OF PRESENT ILLNESS: This is a 72 yo female who currently resides on the Memory Care unit at [facility]. Palliative Care continues to follow patient.   CODE STATUS: DNR ADVANCED DIRECTIVES: Y MOST FORM: no PPS: 40%   PHYSICAL EXAM:   VITALS: Today's Vitals   09/19/17 1301  BP: 108/80  Pulse: 83  Resp: 16  SpO2: 96%  PainSc: 0-No pain    LUNGS: clear to auscultation  CARDIAC: Cor RRR EXTREMITIES: No edema SKIN: Exposed skin dry and intact  NEURO: Alert to self only, confused, anxious,  ambulatory   (Duration of visit and documentation 75 minutes)    Daryl Eastern, RN, BSN

## 2017-09-25 DIAGNOSIS — W108XXA Fall (on) (from) other stairs and steps, initial encounter: Secondary | ICD-10-CM | POA: Diagnosis not present

## 2017-09-25 DIAGNOSIS — M25532 Pain in left wrist: Secondary | ICD-10-CM | POA: Diagnosis not present

## 2017-09-25 DIAGNOSIS — G3183 Dementia with Lewy bodies: Secondary | ICD-10-CM | POA: Diagnosis not present

## 2017-09-25 DIAGNOSIS — M62532 Muscle wasting and atrophy, not elsewhere classified, left forearm: Secondary | ICD-10-CM | POA: Diagnosis not present

## 2017-09-25 DIAGNOSIS — M24832 Other specific joint derangements of left wrist, not elsewhere classified: Secondary | ICD-10-CM | POA: Diagnosis not present

## 2017-09-25 DIAGNOSIS — G20 Parkinson's disease: Secondary | ICD-10-CM | POA: Diagnosis not present

## 2017-09-26 DIAGNOSIS — W108XXA Fall (on) (from) other stairs and steps, initial encounter: Secondary | ICD-10-CM | POA: Diagnosis not present

## 2017-09-26 DIAGNOSIS — M62532 Muscle wasting and atrophy, not elsewhere classified, left forearm: Secondary | ICD-10-CM | POA: Diagnosis not present

## 2017-09-26 DIAGNOSIS — M25532 Pain in left wrist: Secondary | ICD-10-CM | POA: Diagnosis not present

## 2017-09-26 DIAGNOSIS — M24832 Other specific joint derangements of left wrist, not elsewhere classified: Secondary | ICD-10-CM | POA: Diagnosis not present

## 2017-09-26 DIAGNOSIS — G20 Parkinson's disease: Secondary | ICD-10-CM | POA: Diagnosis not present

## 2017-09-26 DIAGNOSIS — G3183 Dementia with Lewy bodies: Secondary | ICD-10-CM | POA: Diagnosis not present

## 2017-09-28 DIAGNOSIS — M24832 Other specific joint derangements of left wrist, not elsewhere classified: Secondary | ICD-10-CM | POA: Diagnosis not present

## 2017-09-28 DIAGNOSIS — M62532 Muscle wasting and atrophy, not elsewhere classified, left forearm: Secondary | ICD-10-CM | POA: Diagnosis not present

## 2017-09-28 DIAGNOSIS — G20 Parkinson's disease: Secondary | ICD-10-CM | POA: Diagnosis not present

## 2017-09-28 DIAGNOSIS — M25532 Pain in left wrist: Secondary | ICD-10-CM | POA: Diagnosis not present

## 2017-09-28 DIAGNOSIS — G3183 Dementia with Lewy bodies: Secondary | ICD-10-CM | POA: Diagnosis not present

## 2017-09-28 DIAGNOSIS — W108XXA Fall (on) (from) other stairs and steps, initial encounter: Secondary | ICD-10-CM | POA: Diagnosis not present

## 2017-10-01 ENCOUNTER — Encounter: Payer: Self-pay | Admitting: Nurse Practitioner

## 2017-10-01 ENCOUNTER — Non-Acute Institutional Stay: Payer: Medicare Other | Admitting: *Deleted

## 2017-10-01 VITALS — BP 129/79 | HR 71 | Resp 18

## 2017-10-01 DIAGNOSIS — Z515 Encounter for palliative care: Secondary | ICD-10-CM

## 2017-10-02 DIAGNOSIS — W108XXA Fall (on) (from) other stairs and steps, initial encounter: Secondary | ICD-10-CM | POA: Diagnosis not present

## 2017-10-02 DIAGNOSIS — M62532 Muscle wasting and atrophy, not elsewhere classified, left forearm: Secondary | ICD-10-CM | POA: Diagnosis not present

## 2017-10-02 DIAGNOSIS — M24832 Other specific joint derangements of left wrist, not elsewhere classified: Secondary | ICD-10-CM | POA: Diagnosis not present

## 2017-10-02 DIAGNOSIS — M25532 Pain in left wrist: Secondary | ICD-10-CM | POA: Diagnosis not present

## 2017-10-02 DIAGNOSIS — G3183 Dementia with Lewy bodies: Secondary | ICD-10-CM | POA: Diagnosis not present

## 2017-10-02 DIAGNOSIS — G20 Parkinson's disease: Secondary | ICD-10-CM | POA: Diagnosis not present

## 2017-10-03 ENCOUNTER — Non-Acute Institutional Stay: Payer: Medicare Other | Admitting: Licensed Clinical Social Worker

## 2017-10-03 DIAGNOSIS — M62532 Muscle wasting and atrophy, not elsewhere classified, left forearm: Secondary | ICD-10-CM | POA: Diagnosis not present

## 2017-10-03 DIAGNOSIS — M25532 Pain in left wrist: Secondary | ICD-10-CM | POA: Diagnosis not present

## 2017-10-03 DIAGNOSIS — G3183 Dementia with Lewy bodies: Secondary | ICD-10-CM | POA: Diagnosis not present

## 2017-10-03 DIAGNOSIS — Z515 Encounter for palliative care: Secondary | ICD-10-CM

## 2017-10-03 DIAGNOSIS — M24832 Other specific joint derangements of left wrist, not elsewhere classified: Secondary | ICD-10-CM | POA: Diagnosis not present

## 2017-10-03 DIAGNOSIS — G20 Parkinson's disease: Secondary | ICD-10-CM | POA: Diagnosis not present

## 2017-10-03 DIAGNOSIS — W108XXA Fall (on) (from) other stairs and steps, initial encounter: Secondary | ICD-10-CM | POA: Diagnosis not present

## 2017-10-05 DIAGNOSIS — M62532 Muscle wasting and atrophy, not elsewhere classified, left forearm: Secondary | ICD-10-CM | POA: Diagnosis not present

## 2017-10-05 DIAGNOSIS — M25532 Pain in left wrist: Secondary | ICD-10-CM | POA: Diagnosis not present

## 2017-10-05 DIAGNOSIS — M24832 Other specific joint derangements of left wrist, not elsewhere classified: Secondary | ICD-10-CM | POA: Diagnosis not present

## 2017-10-05 DIAGNOSIS — G3183 Dementia with Lewy bodies: Secondary | ICD-10-CM | POA: Diagnosis not present

## 2017-10-05 DIAGNOSIS — G20 Parkinson's disease: Secondary | ICD-10-CM | POA: Diagnosis not present

## 2017-10-05 DIAGNOSIS — W108XXA Fall (on) (from) other stairs and steps, initial encounter: Secondary | ICD-10-CM | POA: Diagnosis not present

## 2017-10-06 NOTE — Progress Notes (Signed)
COMMUNITY PALLIATIVE CARE SW NOTE  PATIENT NAME: Kristina Carr DOB: [birth year] MRN: [id number]  PRIMARY CARE PROVIDER: Gayland Curry, DO  RESPONSIBLE PARTY:  Acct ID - Guarantor Home Phone Work Phone Relationship Acct Type  [id number] KINSLIE, HOVE* [phone] Self P/F     [place], [place], [place] [address]     PLAN OF CARE and INTERVENTIONS:             1. GOALS OF CARE/ ADVANCE CARE PLANNING:  Goal is for patient to remain in the facility.  She has a DNR and MOST form. 2. SOCIAL/EMOTIONAL/SPIRITUAL ASSESSMENT/ INTERVENTIONS:  SW met with patient, her caregiver, Nira Conn, and facility LPN, Izora Gala.  Patient was walking in the hallway with T[place].  She answered SW questions, but appeared very distracted, which is normal for patient.  She talked about winning the senior Olympics at the facility.  She denied pain.  Her caregiver reported that she was not tearful today. 3. PATIENT/CAREGIVER EDUCATION/ COPING:  Patient copes by walking the facility halls most of the time.  SW continues providing education to patients caregiver regarding Palliative Care. 4. PERSONAL EMERGENCY PLAN:  Per facility protocol. 5. COMMUNITY RESOURCES COORDINATION/ HEALTH CARE NAVIGATION:  Patient has private sitters. 6. FINANCIAL/LEGAL CONCERNS/INTERVENTIONS:  None     SOCIAL HX:  Social History   Tobacco Use  . Smoking status: Never Smoker  . Smokeless tobacco: Never Used  . Tobacco comment: social  Substance Use Topics  . Alcohol use: No    CODE STATUS:  DNR  ADVANCED DIRECTIVES:  Yes MOST FORM COMPLETE:  Yes HOSPICE EDUCATION PROVIDED:  No PPS:  Patient's appetite is normal.  She ambulates independently. Duration of visit and documentation:  60 minutes.      Creola Corn Macarena Langseth, LCSW

## 2017-10-09 DIAGNOSIS — M25512 Pain in left shoulder: Secondary | ICD-10-CM | POA: Diagnosis not present

## 2017-10-09 DIAGNOSIS — Z4789 Encounter for other orthopedic aftercare: Secondary | ICD-10-CM | POA: Diagnosis not present

## 2017-10-09 DIAGNOSIS — W108XXA Fall (on) (from) other stairs and steps, initial encounter: Secondary | ICD-10-CM | POA: Diagnosis not present

## 2017-10-09 DIAGNOSIS — M25522 Pain in left elbow: Secondary | ICD-10-CM | POA: Diagnosis not present

## 2017-10-09 DIAGNOSIS — G3183 Dementia with Lewy bodies: Secondary | ICD-10-CM | POA: Diagnosis not present

## 2017-10-09 DIAGNOSIS — M25532 Pain in left wrist: Secondary | ICD-10-CM | POA: Diagnosis not present

## 2017-10-09 DIAGNOSIS — M24832 Other specific joint derangements of left wrist, not elsewhere classified: Secondary | ICD-10-CM | POA: Diagnosis not present

## 2017-10-09 DIAGNOSIS — G20 Parkinson's disease: Secondary | ICD-10-CM | POA: Diagnosis not present

## 2017-10-09 DIAGNOSIS — M62532 Muscle wasting and atrophy, not elsewhere classified, left forearm: Secondary | ICD-10-CM | POA: Diagnosis not present

## 2017-10-09 NOTE — Progress Notes (Signed)
COMMUNITY PALLIATIVE CARE RN NOTE  PATIENT NAME: NANNETTE ZILL DOB: [birth year] MRN: [id number]  PRIMARY CARE PROVIDER: Gayland Curry, DO  RESPONSIBLE PARTY:  Acct ID - Guarantor Home Phone Work Phone Relationship Acct Type  [id number] MIEL, WISENER* [phone] Self P/F     [place], [place], [place] [address]    PLAN OF CARE and INTERVENTION:  1. ADVANCE CARE PLANNING/GOALS OF CARE: Remain on Memory Care unit, avoid hospitalizations 2. PATIENT/CAREGIVER EDUCATION: Reinforced Safe Mobility and Fall Prevention 3. DISEASE STATUS: Patient sitting in activity room with hired sitter upon arrival. She is calm and pleasant initially, answering simple questions. Intermittent confusion and word finding difficulties. At moments she became tearful. Generalized tremors and constant movements noted throughout visit. Mood changes several times throughout visits. Remains on Seroquel at bedtime. She was able to be redirected by hired Actuary. She likes to walk often and sitter will ambulate with her around the facility and sit outside. She continually wears a gait belt, as her gait is unsteady at times. She requires assistance with bathing and dressing. Must be prompted several times in order for her to follow directions. Intake remains good. No dysphagia reported. She has an ultrasound scheduled today at the facility to assess L wrist s/p fracture. At times L wrist swells, but she is able to utilize it without c/o pain. Occasional soreness. Sitter reports that home health hours have been cut down to only Sunday through Wednesday from 8:30a to 5:00p. Will continue to follow.  HISTORY OF PRESENT ILLNESS:  This is a 72 yo female who resides at [facility] on the [place] unit. Palliative Care continues to follow patient.  CODE STATUS: DNR ADVANCED DIRECTIVES: Y MOST FORM: no PPS: 40%   PHYSICAL EXAM:   VITALS: Today's Vitals   10/01/17 1207  BP: 129/79  Pulse: 71  Resp: 18   SpO2: 97%  PainSc: 0-No pain    LUNGS: clear to auscultation  CARDIAC: Cor RRR EXTREMITIES: No edema SKIN: Exposed skin dry and intact  NEURO: Alert and oriented to self, intermittent confusion, ambulatory   (Duration of visit and documentation 75 minutes)    Daryl Eastern, RN, BSN

## 2017-10-11 DIAGNOSIS — M24832 Other specific joint derangements of left wrist, not elsewhere classified: Secondary | ICD-10-CM | POA: Diagnosis not present

## 2017-10-11 DIAGNOSIS — M62532 Muscle wasting and atrophy, not elsewhere classified, left forearm: Secondary | ICD-10-CM | POA: Diagnosis not present

## 2017-10-11 DIAGNOSIS — M25512 Pain in left shoulder: Secondary | ICD-10-CM | POA: Diagnosis not present

## 2017-10-11 DIAGNOSIS — M25522 Pain in left elbow: Secondary | ICD-10-CM | POA: Diagnosis not present

## 2017-10-11 DIAGNOSIS — G20 Parkinson's disease: Secondary | ICD-10-CM | POA: Diagnosis not present

## 2017-10-11 DIAGNOSIS — M25532 Pain in left wrist: Secondary | ICD-10-CM | POA: Diagnosis not present

## 2017-10-12 DIAGNOSIS — M25532 Pain in left wrist: Secondary | ICD-10-CM | POA: Diagnosis not present

## 2017-10-12 DIAGNOSIS — M62532 Muscle wasting and atrophy, not elsewhere classified, left forearm: Secondary | ICD-10-CM | POA: Diagnosis not present

## 2017-10-12 DIAGNOSIS — M25522 Pain in left elbow: Secondary | ICD-10-CM | POA: Diagnosis not present

## 2017-10-12 DIAGNOSIS — M24832 Other specific joint derangements of left wrist, not elsewhere classified: Secondary | ICD-10-CM | POA: Diagnosis not present

## 2017-10-12 DIAGNOSIS — M25512 Pain in left shoulder: Secondary | ICD-10-CM | POA: Diagnosis not present

## 2017-10-12 DIAGNOSIS — G20 Parkinson's disease: Secondary | ICD-10-CM | POA: Diagnosis not present

## 2017-10-15 ENCOUNTER — Non-Acute Institutional Stay: Payer: Medicare Other | Admitting: *Deleted

## 2017-10-15 VITALS — BP 127/67 | HR 64 | Temp 97.4°F | Resp 18 | Ht 66.0 in | Wt 142.4 lb

## 2017-10-15 DIAGNOSIS — M25532 Pain in left wrist: Secondary | ICD-10-CM | POA: Diagnosis not present

## 2017-10-15 DIAGNOSIS — G20 Parkinson's disease: Secondary | ICD-10-CM | POA: Diagnosis not present

## 2017-10-15 DIAGNOSIS — M62532 Muscle wasting and atrophy, not elsewhere classified, left forearm: Secondary | ICD-10-CM | POA: Diagnosis not present

## 2017-10-15 DIAGNOSIS — M25512 Pain in left shoulder: Secondary | ICD-10-CM | POA: Diagnosis not present

## 2017-10-15 DIAGNOSIS — M24832 Other specific joint derangements of left wrist, not elsewhere classified: Secondary | ICD-10-CM | POA: Diagnosis not present

## 2017-10-15 DIAGNOSIS — M25522 Pain in left elbow: Secondary | ICD-10-CM | POA: Diagnosis not present

## 2017-10-15 DIAGNOSIS — Z515 Encounter for palliative care: Secondary | ICD-10-CM

## 2017-10-16 NOTE — Progress Notes (Signed)
COMMUNITY PALLIATIVE CARE RN NOTE  PATIENT NAME: TYLAR MERENDINO DOB: 07-16-1945 MRN: [id number]  PRIMARY CARE PROVIDER: Gayland Curry, DO  RESPONSIBLE PARTY:  Acct ID - Guarantor Home Phone Work Phone Relationship Acct Type  [id number] AVNI, TRAORE* [phone] Self P/F     [place], [place], [place] [address]    PLAN OF CARE and INTERVENTION:  1. ADVANCE CARE PLANNING/GOALS OF CARE: Remain on Memory Care unit at facility, avoid hospitalizations 2. PATIENT/CAREGIVER EDUCATION: Reinforced Safe Mobility and Transfers 3. DISEASE STATUS: Met with patient, hired caregiver Nira Conn) and husband [name]) in patient's room. She is sitting up in her recliner looking at television. Pleasant mood and more communicative today. No tearful episodes or anxiety noted throughout visit today. She is trying to make a decision as to where she is going for lunch tomorrow for her birthday. Sitter states that patient's friends from her Parkinson's group came to visit this am, which she always enjoys. Denies pain. She continues to require 1 person assistance with bathing and dressing. Gait belt is always present around her waist and the sitter holds onto this to give more stability during ambulation when needed. Most times, she is able to ambulate independently. Requires frequent redirection and prompting. Intake is good. She feeds herself independently and takes her medications without difficulty. Staff nurse reports that over the past few days, she has been more pleasant and calm. Reviewed chart. New order noted for Biofreeze to her L shoulder TID scheduled x 1 week initiated on 10/11/17. Will continue to monitor overall condition.  HISTORY OF PRESENT ILLNESS:  This is a 72 yo female who resides at [facility] on the [place] [facility]). Palliative Care team continues to follow.   CODE STATUS: DNR   ADVANCED DIRECTIVES: Y MOST FORM: yes PPS: 50%   PHYSICAL EXAM:    VITALS: Today's Vitals   10/15/17 2322  BP: 127/67  Pulse: 64  Resp: 18  Temp: (!) 97.4 F (36.3 C)  TempSrc: Oral  SpO2: 97%  Weight: 142 lb 6.4 oz (64.6 kg)  Height: _0  (1.676 m)  PainSc: 0-No pain    LUNGS: clear to auscultation  CARDIAC: Cor RRR EXTREMITIES: No edema SKIN: Exposed skin dry and intact  NEURO: Alert to person, word-finding difficulties, pleasant mood, ambulatory   (Duration of visit and documentation 75 minutes)    Daryl Eastern, RN, BSN

## 2017-10-18 DIAGNOSIS — M24832 Other specific joint derangements of left wrist, not elsewhere classified: Secondary | ICD-10-CM | POA: Diagnosis not present

## 2017-10-18 DIAGNOSIS — M25522 Pain in left elbow: Secondary | ICD-10-CM | POA: Diagnosis not present

## 2017-10-18 DIAGNOSIS — M62532 Muscle wasting and atrophy, not elsewhere classified, left forearm: Secondary | ICD-10-CM | POA: Diagnosis not present

## 2017-10-18 DIAGNOSIS — G20 Parkinson's disease: Secondary | ICD-10-CM | POA: Diagnosis not present

## 2017-10-18 DIAGNOSIS — M25532 Pain in left wrist: Secondary | ICD-10-CM | POA: Diagnosis not present

## 2017-10-18 DIAGNOSIS — M25512 Pain in left shoulder: Secondary | ICD-10-CM | POA: Diagnosis not present

## 2017-10-19 DIAGNOSIS — M62532 Muscle wasting and atrophy, not elsewhere classified, left forearm: Secondary | ICD-10-CM | POA: Diagnosis not present

## 2017-10-19 DIAGNOSIS — M25522 Pain in left elbow: Secondary | ICD-10-CM | POA: Diagnosis not present

## 2017-10-19 DIAGNOSIS — M24832 Other specific joint derangements of left wrist, not elsewhere classified: Secondary | ICD-10-CM | POA: Diagnosis not present

## 2017-10-19 DIAGNOSIS — M25532 Pain in left wrist: Secondary | ICD-10-CM | POA: Diagnosis not present

## 2017-10-19 DIAGNOSIS — M25512 Pain in left shoulder: Secondary | ICD-10-CM | POA: Diagnosis not present

## 2017-10-19 DIAGNOSIS — G20 Parkinson's disease: Secondary | ICD-10-CM | POA: Diagnosis not present

## 2017-10-22 ENCOUNTER — Non-Acute Institutional Stay (SKILLED_NURSING_FACILITY): Payer: Medicare Other | Admitting: Adult Health

## 2017-10-22 ENCOUNTER — Encounter: Payer: Self-pay | Admitting: Adult Health

## 2017-10-22 DIAGNOSIS — G20 Parkinson's disease: Secondary | ICD-10-CM

## 2017-10-22 DIAGNOSIS — F5101 Primary insomnia: Secondary | ICD-10-CM

## 2017-10-22 DIAGNOSIS — G3183 Dementia with Lewy bodies: Secondary | ICD-10-CM

## 2017-10-22 DIAGNOSIS — M25522 Pain in left elbow: Secondary | ICD-10-CM | POA: Diagnosis not present

## 2017-10-22 DIAGNOSIS — F0280 Dementia in other diseases classified elsewhere without behavioral disturbance: Secondary | ICD-10-CM | POA: Diagnosis not present

## 2017-10-22 DIAGNOSIS — M24832 Other specific joint derangements of left wrist, not elsewhere classified: Secondary | ICD-10-CM | POA: Diagnosis not present

## 2017-10-22 DIAGNOSIS — M25532 Pain in left wrist: Secondary | ICD-10-CM | POA: Diagnosis not present

## 2017-10-22 DIAGNOSIS — S52532S Colles' fracture of left radius, sequela: Secondary | ICD-10-CM

## 2017-10-22 DIAGNOSIS — M62532 Muscle wasting and atrophy, not elsewhere classified, left forearm: Secondary | ICD-10-CM | POA: Diagnosis not present

## 2017-10-22 DIAGNOSIS — M25512 Pain in left shoulder: Secondary | ICD-10-CM | POA: Diagnosis not present

## 2017-10-22 NOTE — Progress Notes (Signed)
Location:  Occupational psychologist of Service:  SNF (31)  Provider:   Cindi Carbon, ANP [place] [phone]   Gayland Curry, DO  Patient Care Team: Gayland Curry, DO as PCP - General (Geriatric Medicine) Gaynelle Arabian, MD as Consulting Physician (Orthopedic Surgery) Ricki Rodriguez, MD as Attending Physician (Psychiatry) Martinique, Amy, MD as Consulting Physician (Dermatology) Rana Snare, MD as Consulting Physician (Urology) Nyoka Cowden, Stephannie Li, PA-C as Consulting Physician (Neurology) Duffy, Creola Corn, LCSW as Social Worker (Licensed Clinical Social Worker) Conan Bowens, RN as Registered Nurse (Hospice and Palliative Medicine)  Extended Emergency Contact Information Primary Emergency Contact: Arta Bruce Address: [address]          [place], [place] [address] Johnnette Litter of [place] Phone: [phone] Work Phone: [phone] Mobile Phone: [phone] Relation: Spouse Secondary Emergency Contact: Deatra James, [place] of [employer] Phone: [phone] Relation: Son  Code Status:  DNR Goals of care: Advanced Directive information Advanced Directives 08/07/2017  Does Patient Have a Medical Advance Directive? Yes  Type of Paramedic of [place];Out of facility DNR (pink MOST or yellow form)  Does patient want to make changes to medical advance directive? No - Patient declined  Copy of [place] in Chart? Yes  Would patient like information on creating a medical advance directive? -  Pre-existing out of facility DNR order (yellow form or pink MOST form) Pink MOST form placed in chart (order not valid for inpatient use)     Chief Complaint  Patient presents with  . Medical Management of Chronic Issues    HPI:  Pt is a 72 y.o. female seen today for medical management of chronic diseases.   She is followed by palliative care due to lewy  body dementia and PD. She has periods of delusions and tearfulness and currently is on paxil and seroquel. Her nurse has not noted any tearful episodes recently, although she continually tlks about situations that are not real. She is ambulatory with a walker but has a festinating gait and dyskinetic movements at times. There is no worsening in tremor, rigidity, falls, etc. Staff do not report any issues in regard to appetite or sleep. Vitals have been stable. She fractured her left radius earlier this year and is on scheduled tylenol. There are no reports of pain or joint swelling.   Past Medical History:  Diagnosis Date  . Abdominal pain, epigastric   . Abnormality of gait   . Anxiety state, unspecified   . Arthritis   . Bladder cancer ([place]) 2011   DR. GRAPEY  . Cervicalgia   . Detrusor instability   . Esophageal reflux   . Flatulence, eructation, and gas pain   . Hiatal hernia   . History of recurrent UTIs   . Irritable bowel syndrome   . Lewy body dementia   . Lumbago   . Nonspecific elevation of levels of transaminase or lactic acid dehydrogenase (LDH)   . Other abnormal glucose   . Other and unspecified hyperlipidemia   . Other and unspecified hyperlipidemia   . Other chest pain   . Other malaise and fatigue   . Pain in joint, site unspecified   . Palpitations   . Parkinson's disease    Dr Tonye Royalty, [facility]  . Personal history of other disorders of nervous system and sense organs   . PONV (postoperative nausea and vomiting)   .  Restless leg syndrome   . Vitamin D deficiency    Past Surgical History:  Procedure Laterality Date  . APPENDECTOMY    . BLADDER TUMOR EXCISION    . BREAST SURGERY     Biopsy-benign  . BUNIONECTOMY     left  . CESAREAN SECTION    . CHEMOTHERAPY     FLUSH-CANCER EXCISED CYSTOSCOPICALLY FOLLOWED BY INSTALLATION OF CHEMOTHERAPY.  . COLONOSCOPY  2011   neg; Dr Sharlett Iles  . LUMBAR LAMINECTOMY     X 3; last @ age34  . TOTAL HIP ARTHROPLASTY Left  04/12/2012   Procedure: TOTAL HIP ARTHROPLASTY ANTERIOR APPROACH;  Surgeon: Gearlean Alf, MD;  Location: [facility];  Service: Orthopedics;  Laterality: Left;  . TRIGGER FINGER RELEASE     X2  . UPPER GI ENDOSCOPY     hiatal hernia    Allergies  Allergen Reactions  . Omeprazole     REACTION: HIVES Because of a history of documented adverse serious drug reaction;Medi Alert bracelet  is recommended  . Simvastatin     REACTION: LEG CRAMPS  . Venlafaxine     REACTION: nausea    Outpatient Encounter Medications as of 10/22/2017  Medication Sig  . acetaminophen (TYLENOL) 325 MG tablet Take 650 mg by mouth 3 (three) times daily.   . carbidopa-levodopa (SINEMET IR) 25-100 MG per tablet Take 2 tablets by mouth 4 (four) times daily.   Marland Kitchen donepezil (ARICEPT) 10 MG tablet Take 10 mg at bedtime by mouth.   . Melatonin 3 MG TABS Take 6 mg by mouth at bedtime.  Marland Kitchen PARoxetine (PAXIL) 20 MG tablet Take 40 mg by mouth every morning.   Marland Kitchen QUEtiapine (SEROQUEL) 50 MG tablet Take 150 mg by mouth at bedtime.   . [DISCONTINUED] RABEprazole (ACIPHEX) 20 MG tablet Take 1 tablet (20 mg total) by mouth daily.   No facility-administered encounter medications on file as of 10/22/2017.     Review of Systems  Unable to perform ROS: Dementia    Immunization History  Administered Date(s) Administered  . Influenza Whole 11/15/2001, 11/08/2009  . Influenza, High Dose Seasonal PF 12/22/2015, 11/24/2016  . Influenza,inj,Quad PF,6+ Mos 03/11/2014, 11/23/2014  . Influenza-Unspecified 10/07/2012  . Pneumococcal Conjugate-13 11/23/2014  . Pneumococcal Polysaccharide-23 11/24/2015  . Td 02/09/1992, 07/20/2008  . Zoster 11/14/2010   Pertinent  Health Maintenance Due  Topic Date Due  . INFLUENZA VACCINE  09/06/2017  . COLONOSCOPY  03/21/2020  . DEXA SCAN  Completed  . PNA vac Low Risk Adult  Completed  . MAMMOGRAM  Discontinued   Fall Risk  11/28/2016 07/12/2016 03/15/2016 11/24/2015 08/25/2015  Falls in the past  year? Yes Yes No Yes Yes  Number falls in past yr: 2 or more 2 or more - 1 2 or more  Injury with Fall? No No - Yes -  Risk for fall due to : - - - - -   Functional Status Survey:    Vitals:   10/22/17 1457  Weight: 142 lb 6.4 oz (64.6 kg)   Wt Readings from Last 3 Encounters:  10/22/17 142 lb 6.4 oz (64.6 kg)  10/15/17 142 lb 6.4 oz (64.6 kg)  09/13/17 144 lb 6.4 oz (65.5 kg)    There is no height or weight on file to calculate BMI. Physical Exam  Constitutional: No distress.  HENT:  Head: Normocephalic and atraumatic.  Neck: No JVD present.  Cardiovascular: Normal rate and regular rhythm.  No murmur heard. Pulmonary/Chest: Effort normal and breath sounds  normal. No respiratory distress. She has no wheezes.  Musculoskeletal: She exhibits no edema, tenderness or deformity.  Neurological: She is alert.  Oriented to self only. Able to f/c.  Festinating gait. No tremor noted. Dyskinetic movements noted.   Skin: Skin is warm and dry. She is not diaphoretic.  Psychiatric: She has a normal mood and affect.  Nursing note and vitals reviewed.   Labs reviewed: Recent Labs    09/03/17  NA 143  K 4.4  BUN 25*  CREATININE 0.7   Recent Labs    09/03/17  AST 21  ALT 7  ALKPHOS 86   Recent Labs    09/03/17  WBC 4.8  HGB 13.6  HCT 40  PLT 264   Lab Results  Component Value Date   TSH 1.43 10/17/2012   Lab Results  Component Value Date   HGBA1C 6.0 02/26/2012   Lab Results  Component Value Date   CHOL 193 07/04/2016   HDL 43 07/04/2016   LDLCALC 116 07/04/2016   LDLDIRECT 135.1 02/26/2012   TRIG 171 (A) 07/04/2016   CHOLHDL 5 02/26/2012    Significant Diagnostic Results in last 30 days:  No results found.  Assessment/Plan  1. Lewy body dementia without behavioral disturbance MMSE 14/30 on 06/18/17 Continues with periods of delusions but no tearful episodes during my visit today Would continue seroquel at 150 mg qhs Continue Aricpet 10  Mg qhs  2.  PARKINSON'S DISEASE Followed by neurology Remains ambulatory and able to feed herself Continue Sinemet 2 tabs QID  3. Primary insomnia Continue melatonin 6 mg qhs  4. Closed Colles' fracture of left radius, sequela Healed  Will decrease tylenol to bid  Family/ staff Communication: discussed with nurse  Labs/tests ordered:  NA

## 2017-10-24 DIAGNOSIS — M25532 Pain in left wrist: Secondary | ICD-10-CM | POA: Diagnosis not present

## 2017-10-24 DIAGNOSIS — M25512 Pain in left shoulder: Secondary | ICD-10-CM | POA: Diagnosis not present

## 2017-10-24 DIAGNOSIS — M24832 Other specific joint derangements of left wrist, not elsewhere classified: Secondary | ICD-10-CM | POA: Diagnosis not present

## 2017-10-24 DIAGNOSIS — M62532 Muscle wasting and atrophy, not elsewhere classified, left forearm: Secondary | ICD-10-CM | POA: Diagnosis not present

## 2017-10-24 DIAGNOSIS — G20 Parkinson's disease: Secondary | ICD-10-CM | POA: Diagnosis not present

## 2017-10-24 DIAGNOSIS — M25522 Pain in left elbow: Secondary | ICD-10-CM | POA: Diagnosis not present

## 2017-10-25 DIAGNOSIS — M25512 Pain in left shoulder: Secondary | ICD-10-CM | POA: Diagnosis not present

## 2017-10-26 DIAGNOSIS — M25532 Pain in left wrist: Secondary | ICD-10-CM | POA: Diagnosis not present

## 2017-10-26 DIAGNOSIS — M25522 Pain in left elbow: Secondary | ICD-10-CM | POA: Diagnosis not present

## 2017-10-26 DIAGNOSIS — M25512 Pain in left shoulder: Secondary | ICD-10-CM | POA: Diagnosis not present

## 2017-10-26 DIAGNOSIS — M62532 Muscle wasting and atrophy, not elsewhere classified, left forearm: Secondary | ICD-10-CM | POA: Diagnosis not present

## 2017-10-26 DIAGNOSIS — G20 Parkinson's disease: Secondary | ICD-10-CM | POA: Diagnosis not present

## 2017-10-26 DIAGNOSIS — M24832 Other specific joint derangements of left wrist, not elsewhere classified: Secondary | ICD-10-CM | POA: Diagnosis not present

## 2017-10-29 DIAGNOSIS — M25522 Pain in left elbow: Secondary | ICD-10-CM | POA: Diagnosis not present

## 2017-10-29 DIAGNOSIS — M25512 Pain in left shoulder: Secondary | ICD-10-CM | POA: Diagnosis not present

## 2017-10-29 DIAGNOSIS — M25532 Pain in left wrist: Secondary | ICD-10-CM | POA: Diagnosis not present

## 2017-10-29 DIAGNOSIS — M24832 Other specific joint derangements of left wrist, not elsewhere classified: Secondary | ICD-10-CM | POA: Diagnosis not present

## 2017-10-29 DIAGNOSIS — G20 Parkinson's disease: Secondary | ICD-10-CM | POA: Diagnosis not present

## 2017-10-29 DIAGNOSIS — M62532 Muscle wasting and atrophy, not elsewhere classified, left forearm: Secondary | ICD-10-CM | POA: Diagnosis not present

## 2017-10-29 NOTE — Progress Notes (Signed)
Duplicate note, disregard.  SJ

## 2017-10-30 DIAGNOSIS — M25522 Pain in left elbow: Secondary | ICD-10-CM | POA: Diagnosis not present

## 2017-10-30 DIAGNOSIS — M25512 Pain in left shoulder: Secondary | ICD-10-CM | POA: Diagnosis not present

## 2017-10-30 DIAGNOSIS — G20 Parkinson's disease: Secondary | ICD-10-CM | POA: Diagnosis not present

## 2017-10-30 DIAGNOSIS — M25532 Pain in left wrist: Secondary | ICD-10-CM | POA: Diagnosis not present

## 2017-10-30 DIAGNOSIS — M24832 Other specific joint derangements of left wrist, not elsewhere classified: Secondary | ICD-10-CM | POA: Diagnosis not present

## 2017-10-30 DIAGNOSIS — M62532 Muscle wasting and atrophy, not elsewhere classified, left forearm: Secondary | ICD-10-CM | POA: Diagnosis not present

## 2017-11-02 DIAGNOSIS — M62532 Muscle wasting and atrophy, not elsewhere classified, left forearm: Secondary | ICD-10-CM | POA: Diagnosis not present

## 2017-11-02 DIAGNOSIS — M24832 Other specific joint derangements of left wrist, not elsewhere classified: Secondary | ICD-10-CM | POA: Diagnosis not present

## 2017-11-02 DIAGNOSIS — M25532 Pain in left wrist: Secondary | ICD-10-CM | POA: Diagnosis not present

## 2017-11-02 DIAGNOSIS — G20 Parkinson's disease: Secondary | ICD-10-CM | POA: Diagnosis not present

## 2017-11-02 DIAGNOSIS — M25512 Pain in left shoulder: Secondary | ICD-10-CM | POA: Diagnosis not present

## 2017-11-02 DIAGNOSIS — M25522 Pain in left elbow: Secondary | ICD-10-CM | POA: Diagnosis not present

## 2017-11-07 DIAGNOSIS — G20 Parkinson's disease: Secondary | ICD-10-CM | POA: Diagnosis not present

## 2017-11-07 DIAGNOSIS — G3183 Dementia with Lewy bodies: Secondary | ICD-10-CM | POA: Diagnosis not present

## 2017-11-07 DIAGNOSIS — Z4789 Encounter for other orthopedic aftercare: Secondary | ICD-10-CM | POA: Diagnosis not present

## 2017-11-07 DIAGNOSIS — M25522 Pain in left elbow: Secondary | ICD-10-CM | POA: Diagnosis not present

## 2017-11-07 DIAGNOSIS — W108XXA Fall (on) (from) other stairs and steps, initial encounter: Secondary | ICD-10-CM | POA: Diagnosis not present

## 2017-11-07 DIAGNOSIS — M25532 Pain in left wrist: Secondary | ICD-10-CM | POA: Diagnosis not present

## 2017-11-07 DIAGNOSIS — M24832 Other specific joint derangements of left wrist, not elsewhere classified: Secondary | ICD-10-CM | POA: Diagnosis not present

## 2017-11-07 DIAGNOSIS — M62532 Muscle wasting and atrophy, not elsewhere classified, left forearm: Secondary | ICD-10-CM | POA: Diagnosis not present

## 2017-11-07 DIAGNOSIS — M25512 Pain in left shoulder: Secondary | ICD-10-CM | POA: Diagnosis not present

## 2017-11-08 ENCOUNTER — Non-Acute Institutional Stay: Payer: Medicare Other | Admitting: *Deleted

## 2017-11-08 ENCOUNTER — Non-Acute Institutional Stay: Payer: Medicare Other | Admitting: Licensed Clinical Social Worker

## 2017-11-08 VITALS — BP 118/78 | HR 72 | Temp 97.1°F | Resp 18 | Ht 66.0 in | Wt 142.2 lb

## 2017-11-08 DIAGNOSIS — Z515 Encounter for palliative care: Secondary | ICD-10-CM

## 2017-11-08 NOTE — Progress Notes (Signed)
COMMUNITY PALLIATIVE CARE SW NOTE  PATIENT NAME: Kristina Carr DOB: 10/29/1945 MRN: [id number]  PRIMARY CARE PROVIDER: Gayland Curry, DO  RESPONSIBLE PARTY:  Acct ID - Guarantor Home Phone Work Phone Relationship Acct Type  [id number] Kristina Carr, Kristina Carr* [phone] Self P/F     [place], [place], [place] [address]     PLAN OF CARE and INTERVENTIONS:             1. GOALS OF CARE/ ADVANCE CARE PLANNING:  For patient to remain in the facility.  She has a DNR and MOST form. 2. SOCIAL/EMOTIONAL/SPIRITUAL ASSESSMENT/ INTERVENTIONS:  SW and Palliative Care RN, Daryl Eastern, met with patient on the Memory Care Unit at [facility].  Per staff, patient no longer has private sitters.  Patient was found in another resident's room.  She referred to not having a good day.  She would not follow simple commands and continued to walk around the unit.  She leans when walking.  She did not answer additional questions and did not display any nonverbal indicators of pain.  Consulted facility RN, Ebony Hail, who stated symptoms were managed at this time. 3. PATIENT/CAREGIVER EDUCATION/ COPING:  Patient copes by continuously walking the facility. 4. PERSONAL EMERGENCY PLAN:  Per facility protocol. 5. COMMUNITY RESOURCES COORDINATION/ HEALTH CARE NAVIGATION:  Per staff, patient no longer has private caregivers. 6. FINANCIAL/LEGAL CONCERNS/INTERVENTIONS:  None..     SOCIAL HX:  Social History   Tobacco Use  . Smoking status: Never Smoker  . Smokeless tobacco: Never Used  . Tobacco comment: social  Substance Use Topics  . Alcohol use: No    CODE STATUS:  DNR ADVANCED DIRECTIVES: Yes MOST FORM COMPLETE:  Yes HOSPICE EDUCATION PROVIDED:  No PPS:  Patient's appetite is normal.  She stands and ambulates independently. Duration of visit and documentation:  40 minutes.      Creola Corn Adna Nofziger, LCSW

## 2017-11-09 DIAGNOSIS — M25532 Pain in left wrist: Secondary | ICD-10-CM | POA: Diagnosis not present

## 2017-11-09 DIAGNOSIS — M62532 Muscle wasting and atrophy, not elsewhere classified, left forearm: Secondary | ICD-10-CM | POA: Diagnosis not present

## 2017-11-09 DIAGNOSIS — M24832 Other specific joint derangements of left wrist, not elsewhere classified: Secondary | ICD-10-CM | POA: Diagnosis not present

## 2017-11-09 DIAGNOSIS — M25512 Pain in left shoulder: Secondary | ICD-10-CM | POA: Diagnosis not present

## 2017-11-09 DIAGNOSIS — M25522 Pain in left elbow: Secondary | ICD-10-CM | POA: Diagnosis not present

## 2017-11-09 DIAGNOSIS — G20 Parkinson's disease: Secondary | ICD-10-CM | POA: Diagnosis not present

## 2017-11-12 ENCOUNTER — Encounter: Payer: Self-pay | Admitting: Adult Health

## 2017-11-12 ENCOUNTER — Non-Acute Institutional Stay (SKILLED_NURSING_FACILITY): Payer: Medicare Other | Admitting: Adult Health

## 2017-11-12 DIAGNOSIS — M19012 Primary osteoarthritis, left shoulder: Secondary | ICD-10-CM | POA: Insufficient documentation

## 2017-11-12 DIAGNOSIS — G20 Parkinson's disease: Secondary | ICD-10-CM

## 2017-11-12 DIAGNOSIS — M25532 Pain in left wrist: Secondary | ICD-10-CM | POA: Diagnosis not present

## 2017-11-12 DIAGNOSIS — M25512 Pain in left shoulder: Secondary | ICD-10-CM | POA: Diagnosis not present

## 2017-11-12 DIAGNOSIS — M62532 Muscle wasting and atrophy, not elsewhere classified, left forearm: Secondary | ICD-10-CM | POA: Diagnosis not present

## 2017-11-12 DIAGNOSIS — M545 Low back pain, unspecified: Secondary | ICD-10-CM

## 2017-11-12 DIAGNOSIS — G3183 Dementia with Lewy bodies: Secondary | ICD-10-CM

## 2017-11-12 DIAGNOSIS — F0280 Dementia in other diseases classified elsewhere without behavioral disturbance: Secondary | ICD-10-CM | POA: Diagnosis not present

## 2017-11-12 DIAGNOSIS — M25522 Pain in left elbow: Secondary | ICD-10-CM | POA: Diagnosis not present

## 2017-11-12 DIAGNOSIS — G8929 Other chronic pain: Secondary | ICD-10-CM

## 2017-11-12 DIAGNOSIS — F323 Major depressive disorder, single episode, severe with psychotic features: Secondary | ICD-10-CM | POA: Diagnosis not present

## 2017-11-12 DIAGNOSIS — M24832 Other specific joint derangements of left wrist, not elsewhere classified: Secondary | ICD-10-CM | POA: Diagnosis not present

## 2017-11-12 NOTE — Progress Notes (Signed)
Location:  Occupational psychologist of Service:  SNF (31) Provider:   Cindi Carbon, ANP [name] [phone]   Gayland Curry, DO  Patient Care Team: Gayland Curry, DO as PCP - General (Geriatric Medicine) Gaynelle Arabian, MD as Consulting Physician (Orthopedic Surgery) Ricki Rodriguez, MD as Attending Physician (Psychiatry) Martinique, Amy, MD as Consulting Physician (Dermatology) Rana Snare, MD as Consulting Physician (Urology) Nyoka Cowden, Stephannie Li, PA-C as Consulting Physician (Neurology) Duffy, Creola Corn, LCSW as Social Worker (Licensed Clinical Social Worker) Conan Bowens, RN as Registered Nurse (Hospice and Palliative Medicine)  Extended Emergency Contact Information Primary Emergency Contact: Arta Bruce Address: [address]          [place], [place] [address] Johnnette Litter of [place] Phone: [phone] Work Phone: [phone] Mobile Phone: [phone] Relation: Spouse Secondary Emergency Contact: Deatra James, [place] of [employer] Phone: [phone] Relation: Son  Code Status:  DNR Goals of care: Advanced Directive information Advanced Directives 08/07/2017  Does Patient Have a Medical Advance Directive? Yes  Type of Paramedic of [place];Out of facility DNR (pink MOST or yellow form)  Does patient want to make changes to medical advance directive? No - Patient declined  Copy of [place] in Chart? Yes  Would patient like information on creating a medical advance directive? -  Pre-existing out of facility DNR order (yellow form or pink MOST form) Pink MOST form placed in chart (order not valid for inpatient use)     Chief Complaint  Patient presents with  . Medical Management of Chronic Issues    HPI:  Pt is a 72 y.o. female seen today for medical management of chronic diseases.    Lewy body dementia: she unfortunately continues  with hallucinations/delusions and periods of agitation. In review of the nursing notes she comes out of her room at times un dressed and can become combative hitting/kicking staff upon redirection. During my visit she is very pleasant and follows commands.  Seroquel was increased in August and there has been some improvement in tearfulness which is typically associated with her delusions. MMSE 06/18/17 14/30 failed clock  Left shoulder pain: xray 10/25/17 Moderate to severe OA to the left shoulder with a large osteophyte.  The staff does notice any issues with pain but the OT has noticed pain during her sessions.  She has a hx of back pain and is on scheduled tylenol but no report of this is noted for the visit today.   She continues with parkinsonian like symptoms and takes Sinemet for times a day. She has an unusual gait with festination but self corrects fairly well and has not needed a walker.   Past Medical History:  Diagnosis Date  . Abdominal pain, epigastric   . Abnormality of gait   . Anxiety state, unspecified   . Arthritis   . Bladder cancer ([place]) 2011   DR. GRAPEY  . Cervicalgia   . Detrusor instability   . Esophageal reflux   . Flatulence, eructation, and gas pain   . Hiatal hernia   . History of recurrent UTIs   . Irritable bowel syndrome   . Lewy body dementia ([place])   . Lumbago   . Nonspecific elevation of levels of transaminase or lactic acid dehydrogenase (LDH)   . Other abnormal glucose   . Other and unspecified hyperlipidemia   . Other and unspecified hyperlipidemia   .  Other chest pain   . Other malaise and fatigue   . Pain in joint, site unspecified   . Palpitations   . Parkinson's disease    Dr Tonye Royalty, [facility]  . Personal history of other disorders of nervous system and sense organs   . PONV (postoperative nausea and vomiting)   . Restless leg syndrome   . Vitamin D deficiency    Past Surgical History:  Procedure Laterality Date  . APPENDECTOMY    . BLADDER  TUMOR EXCISION    . BREAST SURGERY     Biopsy-benign  . BUNIONECTOMY     left  . CESAREAN SECTION    . CHEMOTHERAPY     FLUSH-CANCER EXCISED CYSTOSCOPICALLY FOLLOWED BY INSTALLATION OF CHEMOTHERAPY.  . COLONOSCOPY  2011   neg; Dr Sharlett Iles  . LUMBAR LAMINECTOMY     X 3; last @ age34  . TOTAL HIP ARTHROPLASTY Left 04/12/2012   Procedure: TOTAL HIP ARTHROPLASTY ANTERIOR APPROACH;  Surgeon: Gearlean Alf, MD;  Location: [facility];  Service: Orthopedics;  Laterality: Left;  . TRIGGER FINGER RELEASE     X2  . UPPER GI ENDOSCOPY     hiatal hernia    Allergies  Allergen Reactions  . Omeprazole     REACTION: HIVES Because of a history of documented adverse serious drug reaction;Medi Alert bracelet  is recommended  . Simvastatin     REACTION: LEG CRAMPS  . Venlafaxine     REACTION: nausea    Outpatient Encounter Medications as of 11/12/2017  Medication Sig  . acetaminophen (TYLENOL) 325 MG tablet Take 650 mg by mouth 2 (two) times daily.   . carbidopa-levodopa (SINEMET IR) 25-100 MG per tablet Take 2 tablets by mouth 4 (four) times daily.   Marland Kitchen donepezil (ARICEPT) 10 MG tablet Take 10 mg at bedtime by mouth.   . Melatonin 3 MG TABS Take 6 mg by mouth at bedtime.  Marland Kitchen PARoxetine (PAXIL) 20 MG tablet Take 40 mg by mouth every morning.   Marland Kitchen QUEtiapine (SEROQUEL) 50 MG tablet Take 150 mg by mouth at bedtime.   . [DISCONTINUED] RABEprazole (ACIPHEX) 20 MG tablet Take 1 tablet (20 mg total) by mouth daily.   No facility-administered encounter medications on file as of 11/12/2017.     Review of Systems  Immunization History  Administered Date(s) Administered  . Influenza Whole 11/15/2001, 11/08/2009  . Influenza, High Dose Seasonal PF 12/22/2015, 11/24/2016  . Influenza,inj,Quad PF,6+ Mos 03/11/2014, 11/23/2014  . Influenza-Unspecified 10/07/2012  . Pneumococcal Conjugate-13 11/23/2014  . Pneumococcal Polysaccharide-23 11/24/2015  . Td 02/09/1992, 07/20/2008  . Zoster 11/14/2010    Pertinent  Health Maintenance Due  Topic Date Due  . INFLUENZA VACCINE  09/06/2017  . COLONOSCOPY  03/21/2020  . DEXA SCAN  Completed  . PNA vac Low Risk Adult  Completed  . MAMMOGRAM  Discontinued   Fall Risk  11/28/2016 07/12/2016 03/15/2016 11/24/2015 08/25/2015  Falls in the past year? Yes Yes No Yes Yes  Number falls in past yr: 2 or more 2 or more - 1 2 or more  Injury with Fall? No No - Yes -  Risk for fall due to : - - - - -   Functional Status Survey:    Vitals:   11/12/17 1546  BP: 132/70  Pulse: 60  Resp: 16  Temp: 97.6 F (36.4 C)  SpO2: 96%  Weight: 142 lb 3.2 oz (64.5 kg)   Body mass index is 22.95 kg/m. Physical Exam  Labs reviewed: Recent  Labs    09/03/17  NA 143  K 4.4  BUN 25*  CREATININE 0.7   Recent Labs    09/03/17  AST 21  ALT 7  ALKPHOS 86   Recent Labs    09/03/17  WBC 4.8  HGB 13.6  HCT 40  PLT 264   Lab Results  Component Value Date   TSH 1.43 10/17/2012   Lab Results  Component Value Date   HGBA1C 6.0 02/26/2012   Lab Results  Component Value Date   CHOL 193 07/04/2016   HDL 43 07/04/2016   LDLCALC 116 07/04/2016   LDLDIRECT 135.1 02/26/2012   TRIG 171 (A) 07/04/2016   CHOLHDL 5 02/26/2012    Significant Diagnostic Results in last 30 days:  No results found.  Assessment/Plan  1. Lewy body dementia without behavioral disturbance (HCC) Slight improvement in tearful episodes but issues with aggression are noted.  Would continue seroquel at 150 mg qhs.  2. Primary osteoarthritis of left shoulder Continue OT Continue scheduled tylenol   3. PARKINSON'S DISEASE Continue Sinemet 25/100 QID  4. Chronic midline low back pain without sciatica Controlled, continue scheduled tylenol  5. Severe single current episode of major depressive disorder, with psychotic features ([place]) Slight improvement noted in tearful episodes, continue paxil 40 mg qd     Family/ staff Communication: discussed with  staff/resident  Labs/tests ordered: NA

## 2017-11-13 NOTE — Progress Notes (Signed)
COMMUNITY PALLIATIVE CARE RN NOTE  PATIENT NAME: Kristina Carr DOB: 1945-04-10 MRN: [id number]  PRIMARY CARE PROVIDER: Gayland Curry, DO  RESPONSIBLE PARTY:  Acct ID - Guarantor Home Phone Work Phone Relationship Acct Type  [id number] Kristina Carr* [phone] Self P/F     [place], [place], [place] [address]    PLAN OF CARE and INTERVENTION:  1. ADVANCE CARE PLANNING/GOALS OF CARE: Remain at current facility and avoid hospitalizations 2. PATIENT/CAREGIVER EDUCATION: Reinforced Safe Mobility and Fall Prevention 3. DISEASE STATUS:  Joint visit made with Palliative Care SW, Kristina Carr. Unable to locate patient initially. She was found to be in another resident's room. She was easily directed outside of the room, however when directed to sit with [place] in the common area, she just continued to wander around the unit. She tried to open a locked door at the end of the hall, and we were able to direct her to come towards the common area. She used to have sitters several days per week, however they have been cancelled by husband. No tearfulness noted today, mainly just pacing. She eventually sat down in recliner with other residents towards end of visit. She denies pain. Intake is good. Requires 1 person assistance with bathing, dressing and toileting. She is able to ambulate independently without use of assistive devices and feed herself. Reviewed chart. 10/25/17 an x-ray was obtained of her R shoulder d/t pain. Negative for fractures. 10/30/17 new orders for OT noted for 24 sessions in 8 weeks for self-care, home management and pain. Will continue to monitor.    HISTORY OF PRESENT ILLNESS:  This is a 72 yo female who resides at [facility] on the [place] unit. Palliative Care Team continues to follow patient. Will visit monthly and prn.   CODE STATUS: DNR ADVANCED DIRECTIVES: Y MOST FORM: yes PPS: 50%   PHYSICAL EXAM:   VITALS: Today's Vitals   11/08/17 1355  BP: 118/78  Pulse: 72  Resp: 18  Temp: (!) 97.1 F (36.2 C)  TempSrc: Temporal  SpO2: 98%  Weight: 142 lb 3.2 oz (64.5 kg)  PainSc: 0-No pain    LUNGS: clear to auscultation  CARDIAC: Cor RRR EXTREMITIES: No edema SKIN: Skin color, texture, turgor normal. No rashes or lesions  NEURO: Alert and oriented to person, confused, nonsensical speech, ambulatory   (Duration of visit and documentation 60 minutes)    Daryl Eastern, RN, BSN

## 2017-11-15 DIAGNOSIS — G20 Parkinson's disease: Secondary | ICD-10-CM | POA: Diagnosis not present

## 2017-11-15 DIAGNOSIS — M24832 Other specific joint derangements of left wrist, not elsewhere classified: Secondary | ICD-10-CM | POA: Diagnosis not present

## 2017-11-15 DIAGNOSIS — M62532 Muscle wasting and atrophy, not elsewhere classified, left forearm: Secondary | ICD-10-CM | POA: Diagnosis not present

## 2017-11-15 DIAGNOSIS — M25512 Pain in left shoulder: Secondary | ICD-10-CM | POA: Diagnosis not present

## 2017-11-15 DIAGNOSIS — M25532 Pain in left wrist: Secondary | ICD-10-CM | POA: Diagnosis not present

## 2017-11-15 DIAGNOSIS — M25522 Pain in left elbow: Secondary | ICD-10-CM | POA: Diagnosis not present

## 2017-11-16 DIAGNOSIS — G20 Parkinson's disease: Secondary | ICD-10-CM | POA: Diagnosis not present

## 2017-11-16 DIAGNOSIS — M25522 Pain in left elbow: Secondary | ICD-10-CM | POA: Diagnosis not present

## 2017-11-16 DIAGNOSIS — M25532 Pain in left wrist: Secondary | ICD-10-CM | POA: Diagnosis not present

## 2017-11-16 DIAGNOSIS — M24832 Other specific joint derangements of left wrist, not elsewhere classified: Secondary | ICD-10-CM | POA: Diagnosis not present

## 2017-11-16 DIAGNOSIS — M25512 Pain in left shoulder: Secondary | ICD-10-CM | POA: Diagnosis not present

## 2017-11-16 DIAGNOSIS — M62532 Muscle wasting and atrophy, not elsewhere classified, left forearm: Secondary | ICD-10-CM | POA: Diagnosis not present

## 2017-11-19 DIAGNOSIS — M24832 Other specific joint derangements of left wrist, not elsewhere classified: Secondary | ICD-10-CM | POA: Diagnosis not present

## 2017-11-19 DIAGNOSIS — M62532 Muscle wasting and atrophy, not elsewhere classified, left forearm: Secondary | ICD-10-CM | POA: Diagnosis not present

## 2017-11-19 DIAGNOSIS — M25512 Pain in left shoulder: Secondary | ICD-10-CM | POA: Diagnosis not present

## 2017-11-19 DIAGNOSIS — G20 Parkinson's disease: Secondary | ICD-10-CM | POA: Diagnosis not present

## 2017-11-19 DIAGNOSIS — M25532 Pain in left wrist: Secondary | ICD-10-CM | POA: Diagnosis not present

## 2017-11-19 DIAGNOSIS — M25522 Pain in left elbow: Secondary | ICD-10-CM | POA: Diagnosis not present

## 2017-11-21 DIAGNOSIS — M25532 Pain in left wrist: Secondary | ICD-10-CM | POA: Diagnosis not present

## 2017-11-21 DIAGNOSIS — M24832 Other specific joint derangements of left wrist, not elsewhere classified: Secondary | ICD-10-CM | POA: Diagnosis not present

## 2017-11-21 DIAGNOSIS — M25522 Pain in left elbow: Secondary | ICD-10-CM | POA: Diagnosis not present

## 2017-11-21 DIAGNOSIS — M62532 Muscle wasting and atrophy, not elsewhere classified, left forearm: Secondary | ICD-10-CM | POA: Diagnosis not present

## 2017-11-21 DIAGNOSIS — G20 Parkinson's disease: Secondary | ICD-10-CM | POA: Diagnosis not present

## 2017-11-21 DIAGNOSIS — M25512 Pain in left shoulder: Secondary | ICD-10-CM | POA: Diagnosis not present

## 2017-11-23 DIAGNOSIS — M24832 Other specific joint derangements of left wrist, not elsewhere classified: Secondary | ICD-10-CM | POA: Diagnosis not present

## 2017-11-23 DIAGNOSIS — M25512 Pain in left shoulder: Secondary | ICD-10-CM | POA: Diagnosis not present

## 2017-11-23 DIAGNOSIS — M62532 Muscle wasting and atrophy, not elsewhere classified, left forearm: Secondary | ICD-10-CM | POA: Diagnosis not present

## 2017-11-23 DIAGNOSIS — G20 Parkinson's disease: Secondary | ICD-10-CM | POA: Diagnosis not present

## 2017-11-23 DIAGNOSIS — M25522 Pain in left elbow: Secondary | ICD-10-CM | POA: Diagnosis not present

## 2017-11-23 DIAGNOSIS — M25532 Pain in left wrist: Secondary | ICD-10-CM | POA: Diagnosis not present

## 2017-11-28 DIAGNOSIS — M25512 Pain in left shoulder: Secondary | ICD-10-CM | POA: Diagnosis not present

## 2017-11-28 DIAGNOSIS — M25522 Pain in left elbow: Secondary | ICD-10-CM | POA: Diagnosis not present

## 2017-11-28 DIAGNOSIS — M25532 Pain in left wrist: Secondary | ICD-10-CM | POA: Diagnosis not present

## 2017-11-28 DIAGNOSIS — M24832 Other specific joint derangements of left wrist, not elsewhere classified: Secondary | ICD-10-CM | POA: Diagnosis not present

## 2017-11-28 DIAGNOSIS — G20 Parkinson's disease: Secondary | ICD-10-CM | POA: Diagnosis not present

## 2017-11-28 DIAGNOSIS — M62532 Muscle wasting and atrophy, not elsewhere classified, left forearm: Secondary | ICD-10-CM | POA: Diagnosis not present

## 2017-12-05 ENCOUNTER — Non-Acute Institutional Stay (SKILLED_NURSING_FACILITY): Payer: Medicare Other

## 2017-12-05 ENCOUNTER — Encounter: Payer: Self-pay | Admitting: Internal Medicine

## 2017-12-05 ENCOUNTER — Non-Acute Institutional Stay (SKILLED_NURSING_FACILITY): Payer: Medicare Other | Admitting: Internal Medicine

## 2017-12-05 DIAGNOSIS — Z0000 Encounter for general adult medical examination without abnormal findings: Secondary | ICD-10-CM

## 2017-12-05 DIAGNOSIS — F0281 Dementia in other diseases classified elsewhere with behavioral disturbance: Secondary | ICD-10-CM

## 2017-12-05 DIAGNOSIS — G20A1 Parkinson's disease without dyskinesia, without mention of fluctuations: Secondary | ICD-10-CM

## 2017-12-05 DIAGNOSIS — G20 Parkinson's disease: Secondary | ICD-10-CM | POA: Diagnosis not present

## 2017-12-05 DIAGNOSIS — G3183 Dementia with Lewy bodies: Secondary | ICD-10-CM

## 2017-12-05 DIAGNOSIS — F323 Major depressive disorder, single episode, severe with psychotic features: Secondary | ICD-10-CM | POA: Diagnosis not present

## 2017-12-05 NOTE — Patient Instructions (Signed)
Kristina Carr , I have completed the annual wellness visit as per Medicare guidelines. The attached information is provided for the patient's family, care providers, and facility of residence.  Screening recommendations/referrals: Colonoscopy up to date, due 03/21/2020 Mammogram excluded Bone Density up to date Recommended yearly ophthalmology/optometry visit for glaucoma screening and checkup Recommended yearly dental visit for hygiene and checkup  Vaccinations: Influenza vaccine up to date Pneumococcal vaccine up to date, completed Tdap vaccine up to date, due 07/21/2018 Shingles vaccine not in past records    Advanced directives: in chart  Conditions/risks identified: Fall Risk  Next appointment: Dr. Mariea Clonts makes rounds   Preventive Care 70 Years and Older, Female Preventive care refers to lifestyle choices and visits with your health care provider that can promote health and wellness. What does preventive care include?  A yearly physical exam. This is also called an annual well check.  Dental exams once or twice a year.  Routine eye exams. Ask your health care provider how often you should have your eyes checked.  Personal lifestyle choices, including:  Daily care of your teeth and gums.  Regular physical activity.  Eating a healthy diet.  Avoiding tobacco and drug use.  Limiting alcohol use.  Taking vitamin and mineral supplements as recommended by your health care provider. What happens during an annual well check? The services and screenings done by your health care provider during your annual well check will depend on your age, overall health, lifestyle risk factors, and family history of disease. Counseling  Your health care provider may ask you questions about your:  Alcohol use.  Tobacco use.  Drug use.  Emotional well-being.  Home and relationship well-being.  Eating habits.  History of falls.  Memory and ability to understand  (cognition).  Work and work Statistician.  Reproductive health. Screening  You may have the following tests or measurements:  Height, weight, and BMI.  Blood pressure.  Lipid and cholesterol levels. These may be checked every 5 years, or more frequently if you are over 60 years old.  Skin check.  Lung cancer screening. You may have this screening every year starting at age 70 if you have a 30-pack-year history of smoking and currently smoke or have quit within the past 15 years.  Fecal occult blood test (FOBT) of the stool. You may have this test every year starting at age 70.  Flexible sigmoidoscopy or colonoscopy. You may have a sigmoidoscopy every 5 years or a colonoscopy every 10 years starting at age 70.  Hepatitis C blood test.  Hepatitis B blood test.  Diabetes screening. This is done by checking your blood sugar (glucose) after you have not eaten for a while (fasting). You may have this done every 1-3 years.  Bone density scan. This is done to screen for osteoporosis. You may have this done starting at age 70.  Mammogram. This may be done every 1-2 years. Talk to your health care provider about how often you should have regular mammograms. Talk with your health care provider about your test results, treatment options, and if necessary, the need for more tests. Vaccines  Your health care provider may recommend certain vaccines, such as:  Influenza vaccine. This is recommended every year.  Tetanus, diphtheria, and acellular pertussis (Tdap, Td) vaccine. You may need a Td booster every 10 years.  Zoster vaccine. You may need this after age 80.  Pneumococcal 13-valent conjugate (PCV13) vaccine. One dose is recommended after age 70.  Pneumococcal polysaccharide (PPSV23) vaccine.  One dose is recommended after age 70. Talk to your health care provider about which screenings and vaccines you need and how often you need them. This information is not intended to replace  advice given to you by your health care provider. Make sure you discuss any questions you have with your health care provider. Document Released: 02/19/2015 Document Revised: 10/13/2015 Document Reviewed: 11/24/2014 Elsevier Interactive Patient Education  2017 [place] can cause injuries. They can happen to people of all ages. There are many things you can do to make your home safe and to help prevent falls.  What can I do in the bathroom?  Use night lights.  Install grab bars by the toilet and in the tub and shower. Do not use towel bars as grab bars.  Use non-skid mats or decals in the tub or shower.  If you need to sit down in the shower, use a plastic, non-slip stool.  Keep the floor dry. Clean up any water that spills on the floor as soon as it happens.  Remove soap buildup in the tub or shower regularly.  Attach bath mats securely with double-sided non-slip rug tape.  Do not have throw rugs and other things on the floor that can make you trip. What can I do in the bedroom?  Use night lights.  Make sure that you have a light by your bed that is easy to reach.  Do not use any sheets or blankets that are too big for your bed. They should not hang down onto the floor.  Have a firm chair that has side arms. You can use this for support while you get dressed.  Do not have throw rugs and other things on the floor that can make you trip. What else can I do to help prevent falls?  Wear shoes that:  Do not have high heels.  Have rubber bottoms.  Are comfortable and fit you well.  Are closed at the toe. Do not wear sandals.  If you use a stepladder:  Make sure that it is fully opened. Do not climb a closed stepladder.  Make sure that both sides of the stepladder are locked into place.  Ask someone to hold it for you, if possible.  Clearly mark and make sure that you can see:  Any grab bars or handrails.  First and last  steps.  Where the edge of each step is.  Use tools that help you move around (mobility aids) if they are needed. These include:  Canes.  Walkers.  Scooters.  Crutches.  Turn on the lights when you go into a dark area. Replace any light bulbs as soon as they burn out.  Set up your furniture so you have a clear path. Avoid moving your furniture around.  If any of your floors are uneven, fix them.  Review your medicines with your doctor. Some medicines can make you feel dizzy. This can increase your chance of falling. Ask your doctor what other things that you can do to help prevent falls. This information is not intended to replace advice given to you by your health care provider. Make sure you discuss any questions you have with your health care provider. Document Released: 11/19/2008 Document Revised: 07/01/2015 Document Reviewed: 02/27/2014 Elsevier Interactive Patient Education  2017 [employer].

## 2017-12-05 NOTE — Progress Notes (Signed)
Subjective:   Kristina Carr is a 72 y.o. female who presents for Medicare Annual (Subsequent) preventive examination at [facility] memory care    Objective:     Vitals: BP 130/72 (BP Location: Right Arm, Patient Position: Sitting)   Pulse 62   Temp 97.9 F (36.6 C) (Oral)   Ht 5\' 6"  (1.676 m)   Wt 142 lb (64.4 kg)   BMI 22.92 kg/m   Body mass index is 22.92 kg/m.  Advanced Directives 12/05/2017 08/07/2017 06/19/2017 12/19/2016 11/28/2016 07/12/2016 03/15/2016  Does Patient Have a Medical Advance Directive? Yes Yes Yes Yes - Yes Yes  Type of Advance Directive [place];Living will;Out of facility DNR (pink MOST or yellow form) [place];Out of facility DNR (pink MOST or yellow form) [place];Out of facility DNR (pink MOST or yellow form) [place];Living will Healthcare Power of [place] of [place]  Does patient want to make changes to medical advance directive? No - Patient declined No - Patient declined No - Patient declined No - Patient declined No - Patient declined - -  Copy of [place] in Chart? Yes Yes Yes No - copy requested Yes Yes Yes  Would patient like information on creating a medical advance directive? - - - - - - -  Pre-existing out of facility DNR order (yellow form or pink MOST form) Pink MOST form placed in chart (order not valid for inpatient use) Pink MOST form placed in chart (order not valid for inpatient use) Yellow form placed in chart (order not valid for inpatient use);Pink MOST form placed in chart (order not valid for inpatient use) - - - -    Tobacco Social History   Tobacco Use  Smoking Status Never Smoker  Smokeless Tobacco Never Used  Tobacco Comment   social     Counseling given: Not Answered Comment: social   Clinical Intake:  Pre-visit preparation completed: No  Pain : No/denies pain      Diabetes: No  How often do you need to have someone help you when you read instructions, pamphlets, or other written materials from your doctor or pharmacy?: 4 - Often What is the last grade level you completed in school?: Some college  Interpreter Needed?: No  Information entered by :: Tyson Dense, RN  Past Medical History:  Diagnosis Date  . Abdominal pain, epigastric   . Abnormality of gait   . Anxiety state, unspecified   . Arthritis   . Bladder cancer ([place]) 2011   DR. GRAPEY  . Cervicalgia   . Detrusor instability   . Esophageal reflux   . Flatulence, eructation, and gas pain   . Hiatal hernia   . History of recurrent UTIs   . Irritable bowel syndrome   . Lewy body dementia ([place])   . Lumbago   . Nonspecific elevation of levels of transaminase or lactic acid dehydrogenase (LDH)   . Other abnormal glucose   . Other and unspecified hyperlipidemia   . Other and unspecified hyperlipidemia   . Other chest pain   . Other malaise and fatigue   . Pain in joint, site unspecified   . Palpitations   . Parkinson's disease    Dr Tonye Royalty, [facility]  . Personal history of other disorders of nervous system and sense organs   . PONV (postoperative nausea and vomiting)   . Restless leg syndrome   . Vitamin D deficiency  Past Surgical History:  Procedure Laterality Date  . APPENDECTOMY    . BLADDER TUMOR EXCISION    . BREAST SURGERY     Biopsy-benign  . BUNIONECTOMY     left  . CESAREAN SECTION    . CHEMOTHERAPY     FLUSH-CANCER EXCISED CYSTOSCOPICALLY FOLLOWED BY INSTALLATION OF CHEMOTHERAPY.  . COLONOSCOPY  2011   neg; Dr Sharlett Iles  . LUMBAR LAMINECTOMY     X 3; last @ age34  . TOTAL HIP ARTHROPLASTY Left 04/12/2012   Procedure: TOTAL HIP ARTHROPLASTY ANTERIOR APPROACH;  Surgeon: Gearlean Alf, MD;  Location: [facility];  Service: Orthopedics;  Laterality: Left;  . TRIGGER FINGER RELEASE     X2  . UPPER GI ENDOSCOPY     hiatal hernia   Family History  Problem Relation  Age of Onset  . Lung cancer Father   . Colon cancer Mother   . Hypertension Mother   . Breast cancer Mother 67  . Stomach cancer Maternal Grandmother   . Osteoporosis Maternal Grandmother   . Breast cancer Maternal Aunt 30  . Prostate cancer Paternal Grandfather   . Prostate cancer Unknown        Paternal Great Grandfather  . Prostate cancer Maternal Grandfather   . Colon polyps Sister   . Diabetes Neg Hx   . Stroke Neg Hx   . Heart attack Neg Hx    Social History   Socioeconomic History  . Marital status: Married    Spouse name: Not on file  . Number of children: 1  . Years of education: Not on file  . Highest education level: Not on file  Occupational History  . Occupation: retired    Fish farm manager: UNEMPLOYED    Comment: [employer]  Social Needs  . Financial resource strain: Not hard at all  . Food insecurity:    Worry: Never true    Inability: Never true  . Transportation needs:    Medical: No    Non-medical: No  Tobacco Use  . Smoking status: Never Smoker  . Smokeless tobacco: Never Used  . Tobacco comment: social  Substance and Sexual Activity  . Alcohol use: No  . Drug use: No  . Sexual activity: Yes    Birth control/protection: Post-menopausal  Lifestyle  . Physical activity:    Days per week: 0 days    Minutes per session: 0 min  . Stress: Not at all  Relationships  . Social connections:    Talks on phone: More than three times a week    Gets together: More than three times a week    Attends religious service: Never    Active member of club or organization: No    Attends meetings of clubs or organizations: Never    Relationship status: Married  Other Topics Concern  . Not on file  Social History Narrative   Lives at [place]    Married -[name]   Never smoked   Alcohol none   Exercise none   POA    Outpatient Encounter Medications as of 12/05/2017  Medication Sig  . acetaminophen (TYLENOL) 325 MG tablet Take 650 mg by mouth 2 (two) times  daily.   . carbidopa-levodopa (SINEMET IR) 25-100 MG per tablet Take 2 tablets by mouth 4 (four) times daily.   Marland Kitchen donepezil (ARICEPT) 10 MG tablet Take 10 mg at bedtime by mouth.   . Melatonin 3 MG TABS Take 6 mg by mouth at bedtime.  Marland Kitchen PARoxetine (PAXIL) 20 MG  tablet Take 40 mg by mouth every morning.   Marland Kitchen QUEtiapine (SEROQUEL) 50 MG tablet Take 150 mg by mouth at bedtime.   . [DISCONTINUED] RABEprazole (ACIPHEX) 20 MG tablet Take 1 tablet (20 mg total) by mouth daily.   No facility-administered encounter medications on file as of 12/05/2017.     Activities of Daily Living In your present state of health, do you have any difficulty performing the following activities: 12/05/2017  Hearing? N  Vision? N  Difficulty concentrating or making decisions? Y  Walking or climbing stairs? N  Dressing or bathing? Y  Doing errands, shopping? Y  Preparing Food and eating ? Y  Using the Toilet? N  In the past six months, have you accidently leaked urine? Y  Do you have problems with loss of bowel control? Y  Managing your Medications? Y  Managing your Finances? Y  Housekeeping or managing your Housekeeping? Y  Some recent data might be hidden    Patient Care Team: Gayland Curry, DO as PCP - General (Geriatric Medicine) Gaynelle Arabian, MD as Consulting Physician (Orthopedic Surgery) Ricki Rodriguez, MD as Attending Physician (Psychiatry) Martinique, Amy, MD as Consulting Physician (Dermatology) Rana Snare, MD as Consulting Physician (Urology) Nyoka Cowden, Stephannie Li, PA-C as Consulting Physician (Neurology) Duffy, Creola Corn, LCSW as Social Worker (Licensed Clinical Social Worker) Conan Bowens, RN as Registered Nurse (Hospice and Palliative Medicine)    Assessment:   This is a routine wellness examination for [place].  Exercise Activities and Dietary recommendations Current Exercise Habits: The patient does not participate in regular exercise at present, Exercise limited by: neurologic  condition(s)  Goals   None     Fall Risk Fall Risk  12/05/2017 11/28/2016 07/12/2016 03/15/2016 11/24/2015  Falls in the past year? No Yes Yes No Yes  Number falls in past yr: - 2 or more 2 or more - 1  Injury with Fall? - No No - Yes  Risk for fall due to : - - - - -   Is the patient's home free of loose throw rugs in walkways, pet beds, electrical cords, etc?   yes      Grab bars in the bathroom? yes      Handrails on the stairs?   yes      Adequate lighting?   yes  Depression Screen PHQ 2/9 Scores 12/05/2017 11/28/2016 07/12/2016 03/15/2016  PHQ - 2 Score 6 6 0 0  PHQ- 9 Score 10 13 - -     Cognitive Function MMSE - Mini Mental State Exam 06/18/2017 11/28/2016 04/07/2015  Orientation to time 2 2 2   Orientation to Place 4 4 5   Registration 3 3 3   Attention/ Calculation 0 0 3  Recall 0 2 1  Language- name 2 objects 1 2 2   Language- repeat 0 1 1  Language- follow 3 step command 3 1 2   Language- read & follow direction 1 1 1   Write a sentence 0 0 1  Copy design 0 0 0  Total score 14 16 21         Immunization History  Administered Date(s) Administered  . Influenza Whole 11/15/2001, 11/08/2009  . Influenza, High Dose Seasonal PF 12/22/2015, 11/24/2016  . Influenza,inj,Quad PF,6+ Mos 03/11/2014, 11/23/2014, 11/27/2017  . Influenza-Unspecified 10/07/2012  . Pneumococcal Conjugate-13 11/23/2014  . Pneumococcal Polysaccharide-23 11/24/2015  . Td 02/09/1992, 07/20/2008  . Zoster 11/14/2010    Qualifies for Shingles Vaccine? Not in past records  Screening Tests Health Maintenance  Topic Date  Due  . TETANUS/TDAP  07/21/2018  . COLONOSCOPY  03/21/2020  . INFLUENZA VACCINE  Completed  . DEXA SCAN  Completed  . PNA vac Low Risk Adult  Completed  . MAMMOGRAM  Discontinued  . Hepatitis C Screening  Discontinued    Cancer Screenings: Lung: Low Dose CT Chest recommended if Age 84-80 years, 30 pack-year currently smoking OR have quit w/in 15years. Patient does not  qualify. Breast:  Up to date on Mammogram? excluded Up to date of Bone Density/Dexa? yes Colorectal: excluded  Additional Screenings:  Hepatitis C Screening: declined     Plan:    I have personally reviewed and addressed the Medicare Annual Wellness questionnaire and have noted the following in the patient's chart:  A. Medical and social history B. Use of alcohol, tobacco or illicit drugs  C. Current medications and supplements D. Functional ability and status E.  Nutritional status F.  Physical activity G. Advance directives H. List of other physicians I.  Hospitalizations, surgeries, and ER visits in previous 12 months J.  [place] to include hearing, vision, cognitive, depression L. Referrals and appointments - none  In addition, I have reviewed and discussed with patient certain preventive protocols, quality metrics, and best practice recommendations. A written personalized care plan for preventive services as well as general preventive health recommendations were provided to patient.  See attached scanned questionnaire for additional information.   Signed,   Tyson Dense, RN Nurse Health Advisor  Patient Concerns: None

## 2017-12-05 NOTE — Progress Notes (Signed)
Location:   [facility]   Place of Service:   [facility] Provider:  Merinda Victorino L. Mariea Clonts, D.O., C.M.D.  Gayland Curry, DO  Patient Care Team: Gayland Curry, DO as PCP - General (Geriatric Medicine) Gaynelle Arabian, MD as Consulting Physician (Orthopedic Surgery) Ricki Rodriguez, MD as Attending Physician (Psychiatry) Martinique, Amy, MD as Consulting Physician (Dermatology) Rana Snare, MD as Consulting Physician (Urology) Nyoka Cowden, Stephannie Li, PA-C as Consulting Physician (Neurology) Duffy, Creola Corn, LCSW as Social Worker (Licensed Clinical Social Worker) Conan Bowens, RN as Registered Nurse [facility] and Palliative Medicine)  Extended Emergency Contact Information Primary Emergency Contact: Arta Bruce Address: [address]          [place], [place] [address] Johnnette Litter of [place] Phone: [phone] Work Phone: [phone] Mobile Phone: [phone] Relation: Spouse Secondary Emergency Contact: Deatra James, [place] of [employer] Phone: [phone] Relation: Son  Code Status:  DNR Goals of care: Advanced Directive information Advanced Directives 12/05/2017  Does Patient Have a Medical Advance Directive? Yes  Type of Paramedic of [place];Living will;Out of facility DNR (pink MOST or yellow form)  Does patient want to make changes to medical advance directive? No - Patient declined  Copy of [place] in Chart? Yes  Would patient like information on creating a medical advance directive? -  Pre-existing out of facility DNR order (yellow form or pink MOST form) Pink MOST form placed in chart (order not valid for inpatient use)   Chief Complaint  Patient presents with  . Acute Visit    depression with PHQ-9 of 10, agitation and combativeness with staff during redirection--kicking, scratching and slapping staff, wandering into other residents' rooms   HPI:  Pt is a 72 y.o.  female with Parkinson's, depression, insomnia and Lewy Body Dementia seen today for an acute visit for ongoing depression noted by PHQ-9 done by wellness nurse, increased agitation and combativeness with staff during redirection--kicking, scratching and slapping staff.  She's also wandering into other residents' rooms.  When seen, she was tearful.  She has been on paxil long term and had been seeing psychiatry at [facility] prior to her move to  Memory care.  She also has been on seroquel for her lewy body dementia.  She's also been on aricept long-term.  Namenda was not tolerated.  She has melatonin for sleep.  She denies current pain, but does get back pain and takes scheduled tylenol bid.    Past Medical History:  Diagnosis Date  . Abdominal pain, epigastric   . Abnormality of gait   . Anxiety state, unspecified   . Arthritis   . Bladder cancer ([place]) 2011   DR. GRAPEY  . Cervicalgia   . Detrusor instability   . Esophageal reflux   . Flatulence, eructation, and gas pain   . Hiatal hernia   . History of recurrent UTIs   . Irritable bowel syndrome   . Lewy body dementia ([place])   . Lumbago   . Nonspecific elevation of levels of transaminase or lactic acid dehydrogenase (LDH)   . Other abnormal glucose   . Other and unspecified hyperlipidemia   . Other and unspecified hyperlipidemia   . Other chest pain   . Other malaise and fatigue   . Pain in joint, site unspecified   . Palpitations   . Parkinson's disease    Dr Tonye Royalty, [facility]  . Personal history of other  disorders of nervous system and sense organs   . PONV (postoperative nausea and vomiting)   . Restless leg syndrome   . Vitamin D deficiency    Past Surgical History:  Procedure Laterality Date  . APPENDECTOMY    . BLADDER TUMOR EXCISION    . BREAST SURGERY     Biopsy-benign  . BUNIONECTOMY     left  . CESAREAN SECTION    . CHEMOTHERAPY     FLUSH-CANCER EXCISED CYSTOSCOPICALLY FOLLOWED BY INSTALLATION OF CHEMOTHERAPY.  . COLONOSCOPY   2011   neg; Dr Sharlett Iles  . LUMBAR LAMINECTOMY     X 3; last @ age34  . TOTAL HIP ARTHROPLASTY Left 04/12/2012   Procedure: TOTAL HIP ARTHROPLASTY ANTERIOR APPROACH;  Surgeon: Gearlean Alf, MD;  Location: [place];  Service: Orthopedics;  Laterality: Left;  . TRIGGER FINGER RELEASE     X2  . UPPER GI ENDOSCOPY     hiatal hernia    Allergies  Allergen Reactions  . Omeprazole     REACTION: HIVES Because of a history of documented adverse serious drug reaction;Medi Alert bracelet  is recommended  . Simvastatin     REACTION: LEG CRAMPS  . Venlafaxine     REACTION: nausea    Outpatient Encounter Medications as of 12/05/2017  Medication Sig  . acetaminophen (TYLENOL) 325 MG tablet Take 650 mg by mouth 2 (two) times daily.   . carbidopa-levodopa (SINEMET IR) 25-100 MG per tablet Take 2 tablets by mouth 4 (four) times daily.   Marland Kitchen donepezil (ARICEPT) 10 MG tablet Take 10 mg at bedtime by mouth.   . Melatonin 3 MG TABS Take 6 mg by mouth at bedtime.  Marland Kitchen PARoxetine (PAXIL) 20 MG tablet Take 40 mg by mouth every morning.   Marland Kitchen QUEtiapine (SEROQUEL) 50 MG tablet Take 150 mg by mouth at bedtime.   . [DISCONTINUED] RABEprazole (ACIPHEX) 20 MG tablet Take 1 tablet (20 mg total) by mouth daily.   No facility-administered encounter medications on file as of 12/05/2017.     Review of Systems  Constitutional: Negative for chills and fever.       Weight stable at 142 lbs  HENT: Negative for congestion and hearing loss.   Eyes: Negative for blurred vision.  Respiratory: Negative for cough and shortness of breath.        Constipation does ok with fiber intake with her breakfast, salads later  Cardiovascular: Negative for chest pain, palpitations and leg swelling.  Gastrointestinal: Positive for constipation. Negative for abdominal pain, blood in stool and melena.  Genitourinary: Negative for dysuria.  Musculoskeletal: Positive for back pain and falls.  Skin: Negative for itching and rash.    Neurological: Negative for dizziness and loss of consciousness.  Psychiatric/Behavioral: Positive for depression, hallucinations and memory loss. The patient has insomnia. The patient is not nervous/anxious.     Immunization History  Administered Date(s) Administered  . Influenza Whole 11/15/2001, 11/08/2009  . Influenza, High Dose Seasonal PF 12/22/2015, 11/24/2016  . Influenza,inj,Quad PF,6+ Mos 03/11/2014, 11/23/2014, 11/27/2017  . Influenza-Unspecified 10/07/2012  . Pneumococcal Conjugate-13 11/23/2014  . Pneumococcal Polysaccharide-23 11/24/2015  . Td 02/09/1992, 07/20/2008  . Zoster 11/14/2010   Pertinent  Health Maintenance Due  Topic Date Due  . COLONOSCOPY  03/21/2020  . INFLUENZA VACCINE  Completed  . DEXA SCAN  Completed  . PNA vac Low Risk Adult  Completed  . MAMMOGRAM  Discontinued   Fall Risk  12/05/2017 11/28/2016 07/12/2016 03/15/2016 11/24/2015  Falls in the past  year? No Yes Yes No Yes  Number falls in past yr: - 2 or more 2 or more - 1  Injury with Fall? - No No - Yes  Risk for fall due to : - - - - -   Functional Status Survey:    Vitals:   12/05/17 1600  BP: 112/80  Pulse: 80  Resp: 16  Temp: (!) 97.3 F (36.3 C)   There is no height or weight on file to calculate BMI. Physical Exam  Constitutional: No distress.  Cardiovascular: Normal rate, regular rhythm, normal heart sounds and intact distal pulses.  Pulmonary/Chest: Effort normal and breath sounds normal. No respiratory distress.  Abdominal: Bowel sounds are normal.  Musculoskeletal:  Shuffling gait  Neurological: She is alert. She exhibits abnormal muscle tone.  Skin: Skin is warm and dry.  Psychiatric:  Tearful, speech less clear this visit    Labs reviewed: Recent Labs    09/03/17  NA 143  K 4.4  BUN 25*  CREATININE 0.7   Recent Labs    09/03/17  AST 21  ALT 7  ALKPHOS 86   Recent Labs    09/03/17  WBC 4.8  HGB 13.6  HCT 40  PLT 264   Lab Results  Component Value  Date   TSH 1.43 10/17/2012   Lab Results  Component Value Date   HGBA1C 6.0 02/26/2012   Lab Results  Component Value Date   CHOL 193 07/04/2016   HDL 43 07/04/2016   LDLCALC 116 07/04/2016   LDLDIRECT 135.1 02/26/2012   TRIG 171 (A) 07/04/2016   CHOLHDL 5 02/26/2012    Assessment/Plan 1. Lewy body dementia with behavioral disturbance ([place]) -progressive, recently more agitation and combative behavior with care -will attempt increase in seroquel to 200mg  qhs in hopes it also helps her depression which also seems worse lately  2. PARKINSON'S DISEASE -cont sinemet as is  3. Severe single current episode of major depressive disorder, with psychotic features ([place]) -seems worse, contineus paxil and seroquel--increase the seroquel  Family/ staff Communication: discussed with memory care nurse and nurse manager, SBAR had been completed  Labs/tests ordered:  No new  Kanan Sobek L. Salam Chesterfield, D.O. [facility] [address], [place] [address] Cell Phone (Mon-Fri 8am-5pm):  [phone] On Call:  [phone] & follow prompts after 5pm & weekends Office Phone:  [phone] Office Fax:  [phone]

## 2017-12-06 DIAGNOSIS — Z23 Encounter for immunization: Secondary | ICD-10-CM | POA: Diagnosis not present

## 2017-12-12 ENCOUNTER — Non-Acute Institutional Stay: Payer: Medicare Other | Admitting: *Deleted

## 2017-12-12 VITALS — BP 113/70 | HR 65 | Temp 96.7°F | Resp 18 | Wt 145.6 lb

## 2017-12-12 DIAGNOSIS — Z515 Encounter for palliative care: Secondary | ICD-10-CM

## 2017-12-13 NOTE — Progress Notes (Signed)
COMMUNITY PALLIATIVE CARE RN NOTE  PATIENT NAME: Kristina Carr DOB: Mar 22, 1945 MRN: [id number]  PRIMARY CARE PROVIDER: Gayland Curry, DO  RESPONSIBLE PARTY:  Acct ID - Guarantor Home Phone Work Phone Relationship Acct Type  [id number] STARLA, DELLER* [phone] Self P/F     [place], [place], [place] [address]    PLAN OF CARE and INTERVENTION:  1. ADVANCE CARE PLANNING/GOALS OF CARE: Remain at current facility and avoid going to the hospital. She is a DNR. 2. PATIENT/CAREGIVER EDUCATION: Reinforced Safe Mobility/Transfers and Fall Prevention 3. DISEASE STATUS: Met with patient at the facility on the unit. Initially she was visiting with a friend and seemed to really enjoy this. Having a friend there has kept her calm at this moment. Spoke with facility staff nurse. Most of the time, she is wandering around the unit and has been showing more behaviors such as hitting, slapping and kicking. She has been much more difficult to redirect or follow instructions. She is constantly confused and moving constantly. She often wanders into other residents rooms and tries to go outside. Her Seroquel has been increased to 200 mg QHS. Staff also reports that her husband has significant cognitive issues and he is no longer allowed to take patient off the unit by himself due to his confusion/forgetfulness. She requires assistance with all ADLs. Gait is unsteady at times, but she will not use a walker. She continues without hired caregivers, so staff is having to constantly watch her. Her intake is normal. Occupational Therapy was discontinued on 11/28/17. Will continue to monitor.   HISTORY OF PRESENT ILLNESS:  This is a 72 yo female who resides at [facility] on the [place] unit. Palliative Care Team continues to follow patient. Will visit monthly and PRN.   CODE STATUS: DNR ADVANCED DIRECTIVES: Y MOST FORM: yes PPS: 50%   PHYSICAL EXAM:   VITALS: Today's  Vitals   12/12/17 1310  BP: 113/70  Pulse: 65  Resp: 18  Temp: (!) 96.7 F (35.9 C)  TempSrc: Tympanic  SpO2: 99%  Weight: 145 lb 9.6 oz (66 kg)  PainSc: 0-No pain    LUNGS: clear to auscultation  CARDIAC: Cor RRR EXTREMITIES: No edema SKIN: Skin color, texture, turgor normal. No rashes or lesions  NEURO: Alert to self, generalized tremors, ambulatory   (Duration of visit and documentation 60 minutes)    Daryl Eastern, RN, BSN

## 2017-12-31 ENCOUNTER — Non-Acute Institutional Stay (SKILLED_NURSING_FACILITY): Payer: Medicare Other | Admitting: Adult Health

## 2017-12-31 DIAGNOSIS — F5101 Primary insomnia: Secondary | ICD-10-CM

## 2017-12-31 DIAGNOSIS — G20 Parkinson's disease: Secondary | ICD-10-CM

## 2017-12-31 DIAGNOSIS — F0280 Dementia in other diseases classified elsewhere without behavioral disturbance: Secondary | ICD-10-CM

## 2017-12-31 DIAGNOSIS — G3183 Dementia with Lewy bodies: Secondary | ICD-10-CM

## 2017-12-31 DIAGNOSIS — C679 Malignant neoplasm of bladder, unspecified: Secondary | ICD-10-CM | POA: Diagnosis not present

## 2017-12-31 DIAGNOSIS — R2689 Other abnormalities of gait and mobility: Secondary | ICD-10-CM

## 2018-01-01 ENCOUNTER — Encounter: Payer: Self-pay | Admitting: Adult Health

## 2018-01-01 NOTE — Progress Notes (Signed)
Location:  Occupational psychologist of Service:  SNF (31) Provider:   Cindi Carbon, ANP [place] [phone]   Gayland Curry, DO  Patient Care Team: Gayland Curry, DO as PCP - General (Geriatric Medicine) Gaynelle Arabian, MD as Consulting Physician (Orthopedic Surgery) Ricki Rodriguez, MD as Attending Physician (Psychiatry) Martinique, Amy, MD as Consulting Physician (Dermatology) Rana Snare, MD as Consulting Physician (Urology) Nyoka Cowden, Stephannie Li, PA-C as Consulting Physician (Neurology) Duffy, Creola Corn, LCSW as Social Worker (Licensed Clinical Social Worker) Conan Bowens, RN as Registered Nurse (Hospice and Palliative Medicine)  Extended Emergency Contact Information Primary Emergency Contact: Arta Bruce Address: [address]          [place], [place] [address] Johnnette Litter of [place] Phone: [phone] Work Phone: [phone] Mobile Phone: [phone] Relation: Spouse Secondary Emergency Contact: Deatra James, [place] of [employer] Phone: [phone] Relation: Son  Code Status:  DNR Goals of care: Advanced Directive information Advanced Directives 12/05/2017  Does Patient Have a Medical Advance Directive? Yes  Type of Paramedic of [place];Living will;Out of facility DNR (pink MOST or yellow form)  Does patient want to make changes to medical advance directive? No - Patient declined  Copy of [place] in Chart? Yes  Would patient like information on creating a medical advance directive? -  Pre-existing out of facility DNR order (yellow form or pink MOST form) Pink MOST form placed in chart (order not valid for inpatient use)     Chief Complaint  Patient presents with  . Medical Management of Chronic Issues    HPI:  Pt is a 72 y.o. female seen today for medical management of chronic diseases.  She resides in the memory care setting  with a hx of Lewy body dementia. Dr. Mariea Clonts increased the seroquel to 200 mg nightly due to increased agitation, physical and verbal aggression last month. This seems to have helped a little bit per the staff. Nsg notes are less frequent when documenting issues with behavior. I spoke with her CNA and nurse and while she continues to wander around the facility and into others rooms they have not had any issues with aggression toward other residents. However, they report that she continues to be aggressive during personal care.  During each of my visits she becomes tearful easily but then recovers fairly quickly and laughs. There is difficulty with her  Communicating what she is feelings but in the past she has delusions that are upsetting to her. She remains verbal and ambulatory but needs assistance with dressing, bathing, and set up for feeding. VS and weight are stable. No other acute complaints reported.    Past Medical History:  Diagnosis Date  . Abdominal pain, epigastric   . Abnormality of gait   . Anxiety state, unspecified   . Arthritis   . Bladder cancer ([place]) 2011   DR. GRAPEY  . Cervicalgia   . Detrusor instability   . Esophageal reflux   . Flatulence, eructation, and gas pain   . Hiatal hernia   . History of recurrent UTIs   . Irritable bowel syndrome   . Lewy body dementia ([place])   . Lumbago   . Nonspecific elevation of levels of transaminase or lactic acid dehydrogenase (LDH)   . Other abnormal glucose   . Other and unspecified hyperlipidemia   . Other and unspecified hyperlipidemia   .  Other chest pain   . Other malaise and fatigue   . Pain in joint, site unspecified   . Palpitations   . Parkinson's disease    Dr Tonye Royalty, [facility]  . Personal history of other disorders of nervous system and sense organs   . PONV (postoperative nausea and vomiting)   . Restless leg syndrome   . Vitamin D deficiency    Past Surgical History:  Procedure Laterality Date  . APPENDECTOMY    .  BLADDER TUMOR EXCISION    . BREAST SURGERY     Biopsy-benign  . BUNIONECTOMY     left  . CESAREAN SECTION    . CHEMOTHERAPY     FLUSH-CANCER EXCISED CYSTOSCOPICALLY FOLLOWED BY INSTALLATION OF CHEMOTHERAPY.  . COLONOSCOPY  2011   neg; Dr Sharlett Iles  . LUMBAR LAMINECTOMY     X 3; last @ age34  . TOTAL HIP ARTHROPLASTY Left 04/12/2012   Procedure: TOTAL HIP ARTHROPLASTY ANTERIOR APPROACH;  Surgeon: Gearlean Alf, MD;  Location: [facility];  Service: Orthopedics;  Laterality: Left;  . TRIGGER FINGER RELEASE     X2  . UPPER GI ENDOSCOPY     hiatal hernia    Allergies  Allergen Reactions  . Omeprazole     REACTION: HIVES Because of a history of documented adverse serious drug reaction;Medi Alert bracelet  is recommended  . Simvastatin     REACTION: LEG CRAMPS  . Venlafaxine     REACTION: nausea    Outpatient Encounter Medications as of 12/31/2017  Medication Sig  . acetaminophen (TYLENOL) 325 MG tablet Take 650 mg by mouth 2 (two) times daily.   . carbidopa-levodopa (SINEMET IR) 25-100 MG per tablet Take 2 tablets by mouth 4 (four) times daily.   Marland Kitchen donepezil (ARICEPT) 10 MG tablet Take 10 mg at bedtime by mouth.   . Melatonin 3 MG TABS Take 6 mg by mouth at bedtime.  Marland Kitchen PARoxetine (PAXIL) 20 MG tablet Take 40 mg by mouth every morning.   Marland Kitchen QUEtiapine (SEROQUEL) 100 MG tablet Take 200 mg by mouth at bedtime.  . [DISCONTINUED] QUEtiapine (SEROQUEL) 50 MG tablet Take 200 mg by mouth at bedtime.   . [DISCONTINUED] RABEprazole (ACIPHEX) 20 MG tablet Take 1 tablet (20 mg total) by mouth daily.   No facility-administered encounter medications on file as of 12/31/2017.     Review of Systems  Unable to perform ROS: Dementia    Immunization History  Administered Date(s) Administered  . Influenza Whole 11/15/2001, 11/08/2009  . Influenza, High Dose Seasonal PF 12/22/2015, 11/24/2016  . Influenza,inj,Quad PF,6+ Mos 03/11/2014, 11/23/2014, 11/27/2017  . Influenza-Unspecified 10/07/2012    . Pneumococcal Conjugate-13 11/23/2014  . Pneumococcal Polysaccharide-23 11/24/2015  . Td 02/09/1992, 07/20/2008  . Zoster 11/14/2010   Pertinent  Health Maintenance Due  Topic Date Due  . COLONOSCOPY  03/21/2020  . INFLUENZA VACCINE  Completed  . DEXA SCAN  Completed  . PNA vac Low Risk Adult  Completed  . MAMMOGRAM  Discontinued   Fall Risk  12/05/2017 11/28/2016 07/12/2016 03/15/2016 11/24/2015  Falls in the past year? No Yes Yes No Yes  Number falls in past yr: - 2 or more 2 or more - 1  Injury with Fall? - No No - Yes  Risk for fall due to : - - - - -   Functional Status Survey:    Vitals:   12/31/17 0919  Weight: 145 lb 9.6 oz (66 kg)   Body mass index is 23.5 kg/m. Physical  Exam  Constitutional: No distress.  HENT:  Head: Normocephalic and atraumatic.  Right Ear: External ear normal.  Left Ear: External ear normal.  Nose: Nose normal.  Mouth/Throat: Oropharynx is clear and moist. No oropharyngeal exudate.  Eyes: Pupils are equal, round, and reactive to light. Conjunctivae are normal. Right eye exhibits no discharge. Left eye exhibits no discharge.  Neck: No JVD present.  Cardiovascular: Normal rate and regular rhythm.  No murmur heard. Pulmonary/Chest: Effort normal and breath sounds normal. No respiratory distress. She has no wheezes.  Abdominal: Soft. Bowel sounds are normal. She exhibits no distension. There is no tenderness.  Musculoskeletal: She exhibits no edema, tenderness or deformity.  Neurological: She is alert.  Oriented to self only. Able to f/c intermittently. Unsteady gait but self corrects well.  Chorea like movements to her arms and legs present.  Skin: Skin is warm and dry. She is not diaphoretic.  Psychiatric:  Tearful and later happy  Nursing note and vitals reviewed.   Labs reviewed: Recent Labs    09/03/17  NA 143  K 4.4  BUN 25*  CREATININE 0.7   Recent Labs    09/03/17  AST 21  ALT 7  ALKPHOS 86   Recent Labs    09/03/17   WBC 4.8  HGB 13.6  HCT 40  PLT 264   Lab Results  Component Value Date   TSH 1.43 10/17/2012   Lab Results  Component Value Date   HGBA1C 6.0 02/26/2012   Lab Results  Component Value Date   CHOL 193 07/04/2016   HDL 43 07/04/2016   LDLCALC 116 07/04/2016   LDLDIRECT 135.1 02/26/2012   TRIG 171 (A) 07/04/2016   CHOLHDL 5 02/26/2012    Significant Diagnostic Results in last 30 days:  No results found.  Assessment/Plan  1. Lewy body dementia without behavioral disturbance (H[facility]) Progressive decline in cognition and function  Has associated delusions and agitation that seem to be improved with the increased seroquel dosage  2. PARKINSON'S DISEASE Continue sinemet 25/100 QID  3. Malignant neoplasm of urinary bladder, unspecified site [facility]) Noted in 2011 s/p excision. No further f/u due to advanced dementia.   4. Festinating gait Does not need a walker and is able to self correct but remains a high fall risk  5. Insomnia Continue melatonin 6 mg qhs  Family/ staff Communication: discussed with her nurse  Labs/tests ordered:  NA

## 2018-01-14 ENCOUNTER — Non-Acute Institutional Stay: Payer: Medicare Other | Admitting: Licensed Clinical Social Worker

## 2018-01-14 DIAGNOSIS — Z515 Encounter for palliative care: Secondary | ICD-10-CM

## 2018-01-14 NOTE — Progress Notes (Signed)
COMMUNITY PALLIATIVE CARE SW NOTE  PATIENT NAME: Kristina Carr DOB: March 09, 1945 MRN: [id number]  PRIMARY CARE PROVIDER: Gayland Curry, DO  RESPONSIBLE PARTY:  Acct ID - Guarantor Home Phone Work Phone Relationship Acct Type  [id number] MAIAH, SINNING* [phone] Self P/F     [place] [place], [place], [place] [address]    PLAN OF CARE and INTERVENTIONS:             1. GOALS OF CARE/ ADVANCE CARE PLANNING:  For patient to remain in the facility.  She has a DNR and MOST form. 2. SOCIAL/EMOTIONAL/SPIRITUAL ASSESSMENT/ INTERVENTIONS:  SW met with patient on the Memory Care Unit at [facility] with her husband and a friend, [name].  They were all sitting in the dayroom.  Patient was talking softly and gave minimal eye contact.  She continues fidgeting.  SW consulted facility nurse, Andee Poles, who stated patient fell last week.  Patient sustained no injuries.  RN also stated patient has displayed inappropriate behaviors.   3. PATIENT/CAREGIVER EDUCATION/ COPING:  Patient copes by continuously walking the facility.  SW provided education regarding SW role and Palliative Care Program.  Patient's husband had no questions. 4. PERSONAL EMERGENCY PLAN:  Per facility protocol. 5. COMMUNITY RESOURCES COORDINATION/ HEALTH CARE NAVIGATION:  Per staff, patient no longer has private caregivers. 6. FINANCIAL/LEGAL CONCERNS/INTERVENTIONS:  None..         SOCIAL HX:  Social History   Tobacco Use  . Smoking status: Never Smoker  . Smokeless tobacco: Never Used  . Tobacco comment: social  Substance Use Topics  . Alcohol use: No    CODE STATUS:  DNR  ADVANCED DIRECTIVES:  Yes MOST FORM COMPLETE:  Yes HOSPICE EDUCATION PROVIDED: No PPS:  Patient's appetite is normal.  She stands and ambulates independently. Duration of visit and documentation:  47 Minutes.      Creola Corn Stephen Baruch, LCSW

## 2018-01-22 ENCOUNTER — Other Ambulatory Visit: Payer: Self-pay | Admitting: Internal Medicine

## 2018-01-22 MED ORDER — DIVALPROEX SODIUM 125 MG PO DR TAB: 125.0000 mg | DELAYED_RELEASE_TABLET | Freq: Three times a day (TID) | ORAL | 3 refills | Status: DC

## 2018-01-22 NOTE — Progress Notes (Signed)
Ongoing difficulty with significant behavioral concerns in memory care, taking items and walking around with them that don't belong to her, appearing wide-eyed and anxious (does have chronic wide-eyed look), was stripping down in front of other residents, trying to exit the unit repeatedly, argumentative and resistive at attempts to redirect her.  She is very distracted and gets up mid-meal so she's not finishing her food.  She is giving other residents food that they are not safe to have.  Staff asked if anything can be done to adjust her medications.  She is already on 200mg  seroquel qhs and due to Lewy body, cannot take other antipsychotics safely.    Will attempt depakote 125mg  po bid.  Ok with whichever version resident will take most successfully (tab, cap, sprinkles).   Check level in 2 wks.

## 2018-01-29 ENCOUNTER — Encounter: Payer: Self-pay | Admitting: Internal Medicine

## 2018-01-29 ENCOUNTER — Non-Acute Institutional Stay (SKILLED_NURSING_FACILITY): Payer: Medicare Other | Admitting: Internal Medicine

## 2018-01-29 DIAGNOSIS — F323 Major depressive disorder, single episode, severe with psychotic features: Secondary | ICD-10-CM | POA: Diagnosis not present

## 2018-01-29 DIAGNOSIS — K5900 Constipation, unspecified: Secondary | ICD-10-CM

## 2018-01-29 DIAGNOSIS — G8929 Other chronic pain: Secondary | ICD-10-CM

## 2018-01-29 DIAGNOSIS — F0280 Dementia in other diseases classified elsewhere without behavioral disturbance: Secondary | ICD-10-CM

## 2018-01-29 DIAGNOSIS — G20A1 Parkinson's disease without dyskinesia, without mention of fluctuations: Secondary | ICD-10-CM

## 2018-01-29 DIAGNOSIS — G20 Parkinson's disease: Secondary | ICD-10-CM

## 2018-01-29 DIAGNOSIS — M545 Low back pain, unspecified: Secondary | ICD-10-CM

## 2018-01-29 DIAGNOSIS — K592 Neurogenic bowel, not elsewhere classified: Secondary | ICD-10-CM

## 2018-01-29 DIAGNOSIS — G3183 Dementia with Lewy bodies: Secondary | ICD-10-CM | POA: Diagnosis not present

## 2018-01-29 NOTE — Progress Notes (Signed)
Patient ID: Kristina Carr, female   DOB: 1945-03-30, 72 y.o.   MRN: [id number]  Location:  [place] Room Number: [place] of Service:  SNF [id number]) Provider:   Gayland Curry, DO  Patient Care Team: Gayland Curry, DO as PCP - General (Geriatric Medicine) Gaynelle Arabian, MD as Consulting Physician (Orthopedic Surgery) Ricki Rodriguez, MD as Attending Physician (Psychiatry) Martinique, Amy, MD as Consulting Physician (Dermatology) Rana Snare, MD as Consulting Physician (Urology) Nyoka Cowden Stephannie Li, PA-C as Consulting Physician (Neurology) Duffy, Creola Corn, LCSW as Social Worker (Licensed Clinical Social Worker) Conan Bowens, RN as Registered Nurse [facility] and Palliative Medicine)  Extended Emergency Contact Information Primary Emergency Contact: Arta Bruce Address: [address]          [place], [place] [address] Johnnette Litter of [place] Phone: [phone] Work Phone: [phone] Mobile Phone: [phone] Relation: Spouse Secondary Emergency Contact: Deatra James, [place] of [place] Phone: [phone] Relation: Son  Code Status:  DNR, palliative care Goals of care: Advanced Directive information Advanced Directives 01/29/2018  Does Patient Have a Medical Advance Directive? Yes  Type of Paramedic of [place];Living will;Out of facility DNR (pink MOST or yellow form)  Does patient want to make changes to medical advance directive? No - Patient declined  Copy of [place] in Chart? Yes - validated most recent copy scanned in chart (See row information)  Would patient like information on creating a medical advance directive? -  Pre-existing out of facility DNR order (yellow form or pink MOST form) Yellow form placed in chart (order not valid for inpatient use);Pink MOST form placed in chart (order not valid for inpatient use)      Chief Complaint  Patient presents with  . Medical Management of Chronic Issues    Routine Visit    HPI:  Kristina Carr is a 72 y.o. female seen today for medical management of chronic diseases.  Kristina Carr has a h/o lewy body dementia and parkinson's disease, some low back pain, constipation, urinary incontinence.  Kristina Carr stays in memory care due to her advanced dementia.  Kristina Carr's continuing to have behaviors including taking other residents' things and walking around with them, pushing their walkers, undoing their gait belts, Kristina Carr attempted to wander off the unit when the door was opened for dining staff to bring in meals, Kristina Carr is resistant to care and thee is one documented episode of her refusing her medication.  Kristina Carr was just started on depakote for some of these behaviors but thus far, there are no noted improvements.  Her gait has been very unsteady even before due to her PD.    Kristina Carr struggles with constipation and c/o this today.  A bowel regimen was written though previously a high fiber cereal had been adequately controlling this along with hydration.    Kristina Carr reports that her back hurts more when Kristina Carr's constipated.     Past Medical History:  Diagnosis Date  . Abdominal pain, epigastric   . Abnormality of gait   . Anxiety state, unspecified   . Arthritis   . Bladder cancer ([place]) 2011   DR. GRAPEY  . Cervicalgia   . Detrusor instability   . Esophageal reflux   . Flatulence, eructation, and gas pain   . Hiatal hernia   . History of recurrent UTIs   . Irritable bowel syndrome   . Lewy body dementia (  [place])   . Lumbago   . Nonspecific elevation of levels of transaminase or lactic acid dehydrogenase (LDH)   . Other abnormal glucose   . Other and unspecified hyperlipidemia   . Other and unspecified hyperlipidemia   . Other chest pain   . Other malaise and fatigue   . Pain in joint, site unspecified   . Palpitations   . Parkinson's disease    Dr Tonye Royalty, [facility]  . Personal history of other disorders  of nervous system and sense organs   . PONV (postoperative nausea and vomiting)   . Restless leg syndrome   . Vitamin D deficiency    Past Surgical History:  Procedure Laterality Date  . APPENDECTOMY    . BLADDER TUMOR EXCISION    . BREAST SURGERY     Biopsy-benign  . BUNIONECTOMY     left  . CESAREAN SECTION    . CHEMOTHERAPY     FLUSH-CANCER EXCISED CYSTOSCOPICALLY FOLLOWED BY INSTALLATION OF CHEMOTHERAPY.  . COLONOSCOPY  2011   neg; Dr Sharlett Iles  . LUMBAR LAMINECTOMY     X 3; last @ age34  . TOTAL HIP ARTHROPLASTY Left 04/12/2012   Procedure: TOTAL HIP ARTHROPLASTY ANTERIOR APPROACH;  Surgeon: Gearlean Alf, MD;  Location: [place] [facility];  Service: Orthopedics;  Laterality: Left;  . TRIGGER FINGER RELEASE     X2  . UPPER GI ENDOSCOPY     hiatal hernia    Allergies  Allergen Reactions  . Omeprazole     REACTION: HIVES Because of a history of documented adverse serious drug reaction;Medi Alert bracelet  is recommended  . Simvastatin     REACTION: LEG CRAMPS  . Venlafaxine     REACTION: nausea    Outpatient Encounter Medications as of 01/29/2018  Medication Sig  . acetaminophen (TYLENOL) 325 MG tablet Take 650 mg by mouth 2 (two) times daily.   . carbidopa-levodopa (SINEMET IR) 25-100 MG per tablet Take 2 tablets by mouth 4 (four) times daily.   . divalproex (DEPAKOTE) 125 MG DR tablet Take 1 tablet (125 mg total) by mouth 3 (three) times daily.  Marland Kitchen donepezil (ARICEPT) 10 MG tablet Take 10 mg at bedtime by mouth.   . Melatonin 3 MG TABS Take 6 mg by mouth at bedtime.  Marland Kitchen PARoxetine (PAXIL) 20 MG tablet Take 40 mg by mouth every morning.   Marland Kitchen QUEtiapine (SEROQUEL) 100 MG tablet Take 200 mg by mouth at bedtime.  . [DISCONTINUED] RABEprazole (ACIPHEX) 20 MG tablet Take 1 tablet (20 mg total) by mouth daily.   No facility-administered encounter medications on file as of 01/29/2018.     Review of Systems  Constitutional: Positive for fatigue. Negative for activity change,  appetite change, chills and fever.  HENT: Negative for congestion and hearing loss.   Eyes: Negative for visual disturbance.  Respiratory: Negative for shortness of breath and wheezing.   Cardiovascular: Negative for chest pain, palpitations and leg swelling.  Gastrointestinal: Positive for constipation. Negative for abdominal pain, blood in stool, diarrhea, nausea, rectal pain and vomiting.  Genitourinary: Negative for dysuria.  Musculoskeletal: Positive for back pain and gait problem. Negative for arthralgias and myalgias.  Skin: Negative for color change.    Immunization History  Administered Date(s) Administered  . Influenza Whole 11/15/2001, 11/08/2009  . Influenza, High Dose Seasonal PF 12/22/2015, 11/24/2016  . Influenza,inj,Quad PF,6+ Mos 03/11/2014, 11/23/2014, 11/27/2017  . Influenza-Unspecified 10/07/2012  . Pneumococcal Conjugate-13 11/23/2014  . Pneumococcal Polysaccharide-23 11/24/2015  . Td 02/09/1992, 07/20/2008  .  Zoster 11/14/2010  . Zoster Recombinat (Shingrix) 11/12/2017   Pertinent  Health Maintenance Due  Topic Date Due  . COLONOSCOPY  03/21/2020  . INFLUENZA VACCINE  Completed  . DEXA SCAN  Completed  . PNA vac Low Risk Adult  Completed  . MAMMOGRAM  Discontinued   Fall Risk  12/05/2017 11/28/2016 07/12/2016 03/15/2016 11/24/2015  Falls in the past year? No Yes Yes No Yes  Number falls in past yr: - 2 or more 2 or more - 1  Injury with Fall? - No No - Yes  Risk for fall due to : - - - - -   Functional Status Survey:    Vitals:   01/29/18 1045  BP: 126/72  Pulse: 83  Resp: 18  Temp: (!) 97.4 F (36.3 C)  TempSrc: Oral  SpO2: 98%  Weight: 143 lb (64.9 kg)  Height: 5\' 6"  (1.676 m)   Body mass index is 23.08 kg/m. Physical Exam Vitals signs and nursing note reviewed.  Constitutional:      Appearance: Normal appearance.  HENT:     Head: Normocephalic and atraumatic.  Cardiovascular:     Rate and Rhythm: Normal rate and regular rhythm.      Pulses: Normal pulses.     Heart sounds: Normal heart sounds.  Pulmonary:     Effort: Pulmonary effort is normal.     Breath sounds: Normal breath sounds.  Abdominal:     General: Abdomen is flat. Bowel sounds are normal. There is no distension.     Palpations: Abdomen is soft. There is no mass.     Tenderness: There is no abdominal tenderness.  Musculoskeletal: Normal range of motion.  Skin:    General: Skin is warm and dry.  Neurological:     General: No focal deficit present.     Mental Status: Kristina Carr is alert.     Motor: No weakness.     Coordination: Coordination abnormal.     Comments: Very unsteady gait, festinates  Psychiatric:     Comments: Pleasant with me, wandering about picking up various things, looking at them, putting them down     Labs reviewed: Recent Labs    09/03/17  NA 143  K 4.4  BUN 25*  CREATININE 0.7   Recent Labs    09/03/17  AST 21  ALT 7  ALKPHOS 86   Recent Labs    09/03/17  WBC 4.8  HGB 13.6  HCT 40  PLT 264   Lab Results  Component Value Date   TSH 1.43 10/17/2012   Lab Results  Component Value Date   HGBA1C 6.0 02/26/2012   Lab Results  Component Value Date   CHOL 193 07/04/2016   HDL 43 07/04/2016   LDLCALC 116 07/04/2016   LDLDIRECT 135.1 02/26/2012   TRIG 171 (A) 07/04/2016   CHOLHDL 5 02/26/2012    Assessment/Plan 1. Lewy body dementia without behavioral disturbance ([place]) -has been longstanding, is back on high dose seroquel without change in hallucinations or behaviors, also just added depakote low dose and not really any improvement so far (only a few days)  2. PARKINSON'S DISEASE -cont sinemet 2 tabs qid   3. Severe single current episode of major depressive disorder, with psychotic features ([place]) -seems a little better, was not tearful today like last time, cont same regimen at this point and monitor -cont paxil therapy which Kristina Carr's been on from her psychiatrist at [facility] before her move into memory care -perhaps  consider change in this  if depakote proven ineffective and Kristina Carr's still struggling with tearfulness and agitation  4. Chronic midline low back pain without sciatica -does seem to be worse with constipation so will get it controlled but did also increase tylenol  5. 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 Briann Sarchet, D.O. [facility] [address], [place] [address] Cell Phone (Mon-Fri 8am-5pm):  [phone] On Call:  [phone] & follow prompts after 5pm & weekends Office Phone:  [phone] Office Fax:  [phone]

## 2018-02-05 DIAGNOSIS — R569 Unspecified convulsions: Secondary | ICD-10-CM | POA: Diagnosis not present

## 2018-02-05 DIAGNOSIS — M539 Dorsopathy, unspecified: Secondary | ICD-10-CM | POA: Insufficient documentation

## 2018-02-05 DIAGNOSIS — Z79899 Other long term (current) drug therapy: Secondary | ICD-10-CM | POA: Diagnosis not present

## 2018-02-15 ENCOUNTER — Non-Acute Institutional Stay: Payer: Medicare Other | Admitting: *Deleted

## 2018-02-15 VITALS — BP 137/72 | HR 69 | Temp 96.7°F | Resp 18 | Ht 66.0 in | Wt 143.4 lb

## 2018-02-15 DIAGNOSIS — Z515 Encounter for palliative care: Secondary | ICD-10-CM

## 2018-02-19 NOTE — Progress Notes (Signed)
COMMUNITY PALLIATIVE CARE RN NOTE  PATIENT NAME: Kristina Carr DOB: 01/27/1946 MRN: [id number]  PRIMARY CARE PROVIDER: Gayland Curry, DO  RESPONSIBLE PARTY:  Acct ID - Guarantor Home Phone Work Phone Relationship Acct Type  [id number] ARTA, STUMP* [phone] Self P/F     [place], [place], [place] [address]    PLAN OF CARE and INTERVENTION:  1. ADVANCE CARE PLANNING/GOALS OF CARE: Remain at current facility 2. PATIENT/CAREGIVER EDUCATION: Reinforced Safe Mobility/Transfers and Redirection Techniques 3. DISEASE STATUS: Met with patient on the unit. She is lying down on the couch asleep initially. She did wake up briefly but remained in lying position. No physical indicators of pain noted. Spoke with staff regarding patient behaviors. CNA reports that when trying to get patient ready for bed at night, she can be very combative e.g kicking, scratching and hitting. She often will not cooperate with toileting, brushing her teeth or when trying to put her clothes on. At times she requires 2 person assistance. There are times where she will take her clothes off and walk into the hallways. On 02/03/18 she had a fall where she slid down the wall onto the floor in the day room. No apparent injuries. She is currently taking Seroquel 244m po QHS and on 01/22/18, Depakote 150 mg po BID was added to the medication regimen for mood stabilization. Staff reports that they are not noticing any differences since medication as started. She continues with same behaviors. She is ambulatory, however gait is unsteady at times. She is usually constantly walking around the unit and at times wanders into other residents rooms. She is intermittently incontinent of both bowel and bladder. She wears Depends. She has been placed on scheduled Senna and Miralax for constipation. Tylenol has been increased to TID for back pain. Will continue to monitor.  HISTORY OF PRESENT ILLNESS: This is a 73yo female  [facility] continues to follow patient. Team to continue to visit monthly and PRN.   CODE STATUS: DNR  ADVANCED DIRECTIVES: Y MOST FORM: yes PPS: 50%   PHYSICAL EXAM:   VITALS: Today's Vitals   02/15/18 1405  BP: 137/72  Pulse: 69  Resp: 18  Temp: (!) 96.7 F (35.9 C)  TempSrc: Tympanic  SpO2: 95%  Weight: 143 lb 6.4 oz (65 kg)  PainSc: 0-No pain    LUNGS: clear to auscultation  CARDIAC: Cor RRR EXTREMITIES: No edema SKIN: Exposed skin is dry and intact: Facility staff deny any skin issues noticed  NEURO: Alert to self only, confused, unsteady gait, ambulatory with stand-by assistance   (Duration of visit and documentation 60 minutes)    MDaryl Eastern RN, BSN

## 2018-02-28 ENCOUNTER — Non-Acute Institutional Stay (SKILLED_NURSING_FACILITY): Payer: Medicare Other | Admitting: Adult Health

## 2018-02-28 DIAGNOSIS — G3183 Dementia with Lewy bodies: Secondary | ICD-10-CM | POA: Diagnosis not present

## 2018-02-28 DIAGNOSIS — F323 Major depressive disorder, single episode, severe with psychotic features: Secondary | ICD-10-CM

## 2018-02-28 DIAGNOSIS — G20 Parkinson's disease: Secondary | ICD-10-CM | POA: Diagnosis not present

## 2018-02-28 DIAGNOSIS — F5101 Primary insomnia: Secondary | ICD-10-CM | POA: Diagnosis not present

## 2018-02-28 DIAGNOSIS — M19012 Primary osteoarthritis, left shoulder: Secondary | ICD-10-CM | POA: Diagnosis not present

## 2018-02-28 DIAGNOSIS — F0280 Dementia in other diseases classified elsewhere without behavioral disturbance: Secondary | ICD-10-CM

## 2018-02-28 DIAGNOSIS — K219 Gastro-esophageal reflux disease without esophagitis: Secondary | ICD-10-CM

## 2018-02-28 NOTE — Progress Notes (Signed)
Location:  Occupational psychologist of Service:  SNF (31) Provider:   Cindi Carbon, ANP [place] [phone]   Gayland Curry, DO  Patient Care Team: Gayland Curry, DO as PCP - General (Geriatric Medicine) Gaynelle Arabian, MD as Consulting Physician (Orthopedic Surgery) Ricki Rodriguez, MD as Attending Physician (Psychiatry) Martinique, Amy, MD as Consulting Physician (Dermatology) Rana Snare, MD as Consulting Physician (Urology) Nyoka Cowden, Stephannie Li, PA-C as Consulting Physician (Neurology) Duffy, Creola Corn, LCSW as Social Worker (Licensed Clinical Social Worker) Conan Bowens, RN as Registered Nurse (Hospice and Palliative Medicine)  Extended Emergency Contact Information Primary Emergency Contact: Arta Bruce Address: [address]          [place], [place] [address] Johnnette Litter of [place] Phone: [phone] Work Phone: [phone] Mobile Phone: [phone] Relation: Spouse Secondary Emergency Contact: Deatra James, [place] of [employer] Phone: [phone] Relation: Son  Code Status:  DNR Goals of care: Advanced Directive information Advanced Directives 01/29/2018  Does Patient Have a Medical Advance Directive? Yes  Type of Paramedic of [place];Living will;Out of facility DNR (pink MOST or yellow form)  Does patient want to make changes to medical advance directive? No - Patient declined  Copy of [place] in Chart? Yes - validated most recent copy scanned in chart (See row information)  Would patient like information on creating a medical advance directive? -  Pre-existing out of facility DNR order (yellow form or pink MOST form) Yellow form placed in chart (order not valid for inpatient use);Pink MOST form placed in chart (order not valid for inpatient use)     Chief Complaint  Patient presents with  . Medical Management of Chronic  Issues    HPI:  Pt is a 73 y.o. female seen today for medical management of chronic diseases.  She resides in the memory care setting with a hx of Lewy body dementia.  She remains ambulatory and "busy" throughout the unit. She tends to get into to things like washing her hands with the water pitcher the nurse has on her cart. She has periods of tearfulness but this is slightly less recently. The resident is not able to answer q's appropriately. In my interview with the nurse she did not note any hallucinations. Her behavior of tearfulness and agitation is slightly improved. Nsg notes in matrix indicate that she needs to be redirected regularly for inappropriate behaviors such as disrobing. No other complaints noted.  She remains ambulatory without the use of a walker and has hx of PD. No increase in tremor or rigidity.  02/05/18 dep level 19 Past Medical History:  Diagnosis Date  . Abdominal pain, epigastric   . Abnormality of gait   . Anxiety state, unspecified   . Arthritis   . Bladder cancer ([place]) 2011   DR. GRAPEY  . Cervicalgia   . Detrusor instability   . Esophageal reflux   . Flatulence, eructation, and gas pain   . Hiatal hernia   . History of recurrent UTIs   . Irritable bowel syndrome   . Lewy body dementia ([place])   . Lumbago   . Nonspecific elevation of levels of transaminase or lactic acid dehydrogenase (LDH)   . Other abnormal glucose   . Other and unspecified hyperlipidemia   . Other and unspecified hyperlipidemia   . Other chest pain   . Other malaise and fatigue   .  Pain in joint, site unspecified   . Palpitations   . Parkinson's disease    Dr Tonye Royalty, [facility]  . Personal history of other disorders of nervous system and sense organs   . PONV (postoperative nausea and vomiting)   . Restless leg syndrome   . Vitamin D deficiency    Past Surgical History:  Procedure Laterality Date  . APPENDECTOMY    . BLADDER TUMOR EXCISION    . BREAST SURGERY     Biopsy-benign  .  BUNIONECTOMY     left  . CESAREAN SECTION    . CHEMOTHERAPY     FLUSH-CANCER EXCISED CYSTOSCOPICALLY FOLLOWED BY INSTALLATION OF CHEMOTHERAPY.  . COLONOSCOPY  2011   neg; Dr Sharlett Iles  . LUMBAR LAMINECTOMY     X 3; last @ age34  . TOTAL HIP ARTHROPLASTY Left 04/12/2012   Procedure: TOTAL HIP ARTHROPLASTY ANTERIOR APPROACH;  Surgeon: Gearlean Alf, MD;  Location: [place];  Service: Orthopedics;  Laterality: Left;  . TRIGGER FINGER RELEASE     X2  . UPPER GI ENDOSCOPY     hiatal hernia    Allergies  Allergen Reactions  . Omeprazole     REACTION: HIVES Because of a history of documented adverse serious drug reaction;Medi Alert bracelet  is recommended  . Simvastatin     REACTION: LEG CRAMPS  . Venlafaxine     REACTION: nausea    Outpatient Encounter Medications as of 02/28/2018  Medication Sig  . acetaminophen (TYLENOL) 325 MG tablet Take 650 mg by mouth 3 (three) times daily.   . carbidopa-levodopa (SINEMET IR) 25-100 MG per tablet Take 2 tablets by mouth 4 (four) times daily.   . divalproex (DEPAKOTE) 125 MG DR tablet Take 1 tablet (125 mg total) by mouth 3 (three) times daily. (Patient taking differently: Take 125 mg by mouth 2 (two) times daily. )  . donepezil (ARICEPT) 10 MG tablet Take 10 mg at bedtime by mouth.   . Melatonin 3 MG TABS Take 6 mg by mouth at bedtime.  Marland Kitchen PARoxetine (PAXIL) 20 MG tablet Take 40 mg by mouth every morning.   . polyethylene glycol (MIRALAX / GLYCOLAX) packet Take 17 g by mouth every three (3) days as needed.  Marland Kitchen QUEtiapine (SEROQUEL) 100 MG tablet Take 200 mg by mouth at bedtime.  . sennosides-docusate sodium (SENOKOT-S) 8.6-50 MG tablet Take 2 tablets by mouth every other day.  . [DISCONTINUED] RABEprazole (ACIPHEX) 20 MG tablet Take 1 tablet (20 mg total) by mouth daily.   No facility-administered encounter medications on file as of 02/28/2018.     Review of Systems  Unable to perform ROS: Dementia    Immunization History  Administered  Date(s) Administered  . Influenza Whole 11/15/2001, 11/08/2009  . Influenza, High Dose Seasonal PF 12/22/2015, 11/24/2016  . Influenza,inj,Quad PF,6+ Mos 03/11/2014, 11/23/2014, 11/27/2017  . Influenza-Unspecified 10/07/2012  . Pneumococcal Conjugate-13 11/23/2014  . Pneumococcal Polysaccharide-23 11/24/2015  . Td 02/09/1992, 07/20/2008  . Zoster 11/14/2010  . Zoster Recombinat (Shingrix) 11/12/2017   Pertinent  Health Maintenance Due  Topic Date Due  . COLONOSCOPY  03/21/2020  . INFLUENZA VACCINE  Completed  . DEXA SCAN  Completed  . PNA vac Low Risk Adult  Completed  . MAMMOGRAM  Discontinued   Fall Risk  12/05/2017 11/28/2016 07/12/2016 03/15/2016 11/24/2015  Falls in the past year? No Yes Yes No Yes  Number falls in past yr: - 2 or more 2 or more - 1  Injury with Fall? - No No -  Yes  Risk for fall due to : - - - - -   Functional Status Survey:    Vitals:   02/28/18 1122  Weight: 143 lb 6.4 oz (65 kg)   Body mass index is 23.15 kg/m.  Wt Readings from Last 3 Encounters:  02/28/18 143 lb 6.4 oz (65 kg)  02/15/18 143 lb 6.4 oz (65 kg)  01/29/18 143 lb (64.9 kg)   Physical Exam Vitals signs and nursing note reviewed.  Constitutional:      General: She is not in acute distress.    Appearance: She is not diaphoretic.  HENT:     Head: Normocephalic and atraumatic.     Nose: Nose normal.  Eyes:     General:        Right eye: No discharge.        Left eye: No discharge.     Conjunctiva/sclera: Conjunctivae normal.     Pupils: Pupils are equal, round, and reactive to light.  Neck:     Vascular: No JVD.  Cardiovascular:     Rate and Rhythm: Normal rate and regular rhythm.     Heart sounds: No murmur.  Pulmonary:     Effort: Pulmonary effort is normal. No respiratory distress.     Breath sounds: Normal breath sounds. No wheezing.  Abdominal:     General: Bowel sounds are normal. There is no distension.     Palpations: Abdomen is soft.     Tenderness: There is no  abdominal tenderness.  Musculoskeletal:        General: No tenderness or deformity.  Skin:    General: Skin is warm and dry.  Neurological:     General: No focal deficit present.     Mental Status: She is alert. Mental status is at baseline.     Comments: Oriented to self only. Able to f/c intermittently. Unsteady gait but self corrects well.  Chorea like movements to her arms and legs present.  Psychiatric:        Mood and Affect: Mood normal.   02/05/18 dep level 19  Labs reviewed: Recent Labs    09/03/17  NA 143  K 4.4  BUN 25*  CREATININE 0.7   Recent Labs    09/03/17  AST 21  ALT 7  ALKPHOS 86   Recent Labs    09/03/17  WBC 4.8  HGB 13.6  HCT 40  PLT 264   Lab Results  Component Value Date   TSH 1.43 10/17/2012   Lab Results  Component Value Date   HGBA1C 6.0 02/26/2012   Lab Results  Component Value Date   CHOL 193 07/04/2016   HDL 43 07/04/2016   LDLCALC 116 07/04/2016   LDLDIRECT 135.1 02/26/2012   TRIG 171 (A) 07/04/2016   CHOLHDL 5 02/26/2012    Significant Diagnostic Results in last 30 days:  No results found.  Assessment/Plan  1. PARKINSON'S DISEASE Continues on Sinemet 2 tabs QID  2. Lewy body dementia without behavioral disturbance (HCC) Continue Depakote 125 mg BID and Aricept 10 mg qd  3. Primary osteoarthritis of left shoulder Also with hx of DDD of the low back and arthritic complaints of the hip. Continue scheduled tylenol.  4. Primary insomnia Continue melatonin 6 mg qhs  5. Severe single current episode of major depressive disorder, with psychotic features (H[facility]) Continue Paxil 40 mg qd  6. Gastroesophageal reflux disease, esophagitis presence not specified Off PPI with no reports of GI issues   Family/ staff  Communication: discussed with her nurse  Labs/tests ordered:  NA

## 2018-03-04 ENCOUNTER — Encounter: Payer: Self-pay | Admitting: Adult Health

## 2018-03-25 ENCOUNTER — Non-Acute Institutional Stay (SKILLED_NURSING_FACILITY): Payer: Medicare Other | Admitting: Adult Health

## 2018-03-25 ENCOUNTER — Encounter: Payer: Self-pay | Admitting: Adult Health

## 2018-03-25 DIAGNOSIS — K581 Irritable bowel syndrome with constipation: Secondary | ICD-10-CM

## 2018-03-25 DIAGNOSIS — F0280 Dementia in other diseases classified elsewhere without behavioral disturbance: Secondary | ICD-10-CM

## 2018-03-25 DIAGNOSIS — K5900 Constipation, unspecified: Secondary | ICD-10-CM | POA: Diagnosis not present

## 2018-03-25 DIAGNOSIS — G20 Parkinson's disease: Secondary | ICD-10-CM

## 2018-03-25 DIAGNOSIS — K449 Diaphragmatic hernia without obstruction or gangrene: Secondary | ICD-10-CM | POA: Diagnosis not present

## 2018-03-25 DIAGNOSIS — G3183 Dementia with Lewy bodies: Secondary | ICD-10-CM | POA: Diagnosis not present

## 2018-03-25 DIAGNOSIS — K592 Neurogenic bowel, not elsewhere classified: Secondary | ICD-10-CM

## 2018-03-26 ENCOUNTER — Non-Acute Institutional Stay: Payer: Medicare Other | Admitting: Licensed Clinical Social Worker

## 2018-03-26 ENCOUNTER — Encounter: Payer: Self-pay | Admitting: Adult Health

## 2018-03-26 DIAGNOSIS — Z515 Encounter for palliative care: Secondary | ICD-10-CM
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
## 2018-03-26 NOTE — Progress Notes (Signed)
COMMUNITY PALLIATIVE CARE SW NOTE  PATIENT NAME: Kristina Carr DOB: 07/19/1945 MRN: [id number]  PRIMARY CARE PROVIDER: Gayland Curry, DO  RESPONSIBLE PARTY:  Acct ID - Guarantor Home Phone Work Phone Relationship Acct Type  [id number] [name], [name] [phone] Self P/F     [place], [place], [place] [address]     PLAN OF CARE and INTERVENTIONS:             1. GOALS OF CARE/ ADVANCE CARE PLANNING:  Goal is for patient to remain at the facility.  Patient has a DNR and MOST form. 2. SOCIAL/EMOTIONAL/SPIRITUAL ASSESSMENT/ INTERVENTIONS:  SW met with patient at [facility] on the memory care unit.  She was eating lunch and appeared to have difficulty with tremors while getting the utensil to her mouth.  She denied pain.  Patient gave good eye contact, but her verbalizations were more difficult to understand.  The facility nurse, Andee Poles, stated there had been some medication changes.. 3. PATIENT/CAREGIVER EDUCATION/ COPING:  SW provided education to the facility nurse regarding Palliative Care. 4. PERSONAL EMERGENCY PLAN:  Per facility protocol. 5. COMMUNITY RESOURCES COORDINATION/ HEALTH CARE NAVIGATION:  None 6. FINANCIAL/LEGAL CONCERNS/INTERVENTIONS:  None     SOCIAL HX:  Social History   Tobacco Use  . Smoking status: Never Smoker  . Smokeless tobacco: Never Used  . Tobacco comment: social  Substance Use Topics  . Alcohol use: No    CODE STATUS:  DNR ADVANCED DIRECTIVES: Yes MOST FORM COMPLETE:  Yes HOSPICE EDUCATION PROVIDED: No PPS:  Patient's appetite is normal per staff.  She stands and ambulates independently, but can be very unsteady at times. Duration of visit and documentation:  60 minutes.      Kristina Corn [name], LCSW

## 2018-04-08 DIAGNOSIS — Z79899 Other long term (current) drug therapy: Secondary | ICD-10-CM | POA: Diagnosis not present

## 2018-04-08 DIAGNOSIS — E785 Hyperlipidemia, unspecified: Secondary | ICD-10-CM | POA: Diagnosis not present

## 2018-04-08 DIAGNOSIS — R569 Unspecified convulsions: Secondary | ICD-10-CM | POA: Diagnosis not present

## 2018-04-08 DIAGNOSIS — D649 Anemia, unspecified: Secondary | ICD-10-CM | POA: Diagnosis not present

## 2018-04-08 LAB — CBC AND DIFFERENTIAL
HCT: 42 (ref 36–46)
Hemoglobin: 14.1 (ref 12.0–16.0)
Platelets: 310 (ref 150–399)
WBC: 6.5

## 2018-04-08 LAB — HEPATIC FUNCTION PANEL
ALT: 14 (ref 7–35)
AST: 12 — AB (ref 13–35)
Alkaline Phosphatase: 57 (ref 25–125)
Bilirubin, Total: 0.3

## 2018-04-16 ENCOUNTER — Non-Acute Institutional Stay: Payer: Medicare Other | Admitting: *Deleted

## 2018-04-16 VITALS — BP 120/74 | HR 72 | Temp 96.9°F | Resp 20 | Ht 66.0 in | Wt 145.6 lb

## 2018-04-16 DIAGNOSIS — Z515 Encounter for palliative care: Secondary | ICD-10-CM

## 2018-04-17 DIAGNOSIS — M79645 Pain in left finger(s): Secondary | ICD-10-CM | POA: Diagnosis not present

## 2018-04-17 NOTE — Progress Notes (Signed)
COMMUNITY PALLIATIVE CARE RN NOTE  PATIENT NAME: Kristina Carr DOB: 07/10/1945 MRN: [id number]  PRIMARY CARE PROVIDER: Gayland Curry, DO  RESPONSIBLE PARTY:  Acct ID - Guarantor Home Phone Work Phone Relationship Acct Type  [id number] PENI, RUPARD* [phone] Self P/F     [place], [place], [place] [address]    PLAN OF CARE and INTERVENTION:  1. ADVANCE CARE PLANNING/GOALS OF CARE: Goal is for patient to remain at current facility on Memory Care unit. She is a DNR 2. PATIENT/CAREGIVER EDUCATION: Reinforced Safe Mobility and Fall Prevention 3. DISEASE STATUS: Met with patient in the living room area of the facility. She is sitting up in a chair awake and alert, but is constantly moving at all times d/t her Parkinson's disease. She is minimally engaging. She continues with behaviors of wandering around the unit into other residents rooms. Her gait is becoming more unsteady and she had 2 falls yesterday without injury. Staff CNA reports that she usually will take patient in her room after meals so she can lie down, as she sometimes falls asleep while sitting up in chair. She remained seated throughout my visit today. She sometimes requires 2 person assistance with bathing and dressing when she is more resistant to care. She is currently on Depakote 125 mg TID as of 03/25/18. On 04/11/18 her Seroquel was decreased to 150 mg Q HS from 200 mg and MD/NP wants behaviors monitored and assess if there is a reduction in chorea-like movements. Her intake is variable. Her weight is relatively stable. Current weight 145.6 lbs and 144.8 lbs last month. She is incontinent of both bowel and bladder. No evidence of pain noted. Will continue to monitor.  HISTORY OF PRESENT ILLNESS:  This is a 73 yo female who resides at [facility] on the [place] unit. Palliative Care Team continues to follow patient. Will continue to visit monthly and PRN.  CODE STATUS: DNR ADVANCED  DIRECTIVES: Y MOST FORM: yes PPS: 50%   PHYSICAL EXAM:   VITALS: Today's Vitals   04/16/18 1328  BP: 120/74  Pulse: 72  Resp: 20  Temp: (!) 96.9 F (36.1 C)  TempSrc: Temporal  SpO2: 98%  Weight: 145 lb 9.6 oz (66 kg)  PainSc: 0-No pain    LUNGS: clear to auscultation  CARDIAC: Cor RRR EXTREMITIES: No edema SKIN: Exposed skin is dry and intact  NEURO: Alert and oriented to self only, flat affect, minimally engaging, ambulatory (unsteady gait)   (Duration of visit and documentation 60 minutes)    Daryl Eastern, RN BSN

## 2018-04-29 ENCOUNTER — Non-Acute Institutional Stay: Payer: Medicare Other | Admitting: *Deleted

## 2018-04-29 ENCOUNTER — Other Ambulatory Visit: Payer: Self-pay

## 2018-04-29 VITALS — BP 114/74 | HR 73 | Temp 96.8°F | Resp 16 | Ht 66.0 in | Wt 145.6 lb

## 2018-04-29 DIAGNOSIS — Z515 Encounter for palliative care: Secondary | ICD-10-CM

## 2018-04-30 ENCOUNTER — Encounter: Payer: Self-pay | Admitting: Internal Medicine

## 2018-04-30 ENCOUNTER — Non-Acute Institutional Stay (SKILLED_NURSING_FACILITY): Payer: Medicare Other | Admitting: Internal Medicine

## 2018-04-30 DIAGNOSIS — G20 Parkinson's disease: Secondary | ICD-10-CM | POA: Diagnosis not present

## 2018-04-30 DIAGNOSIS — M545 Low back pain, unspecified: Secondary | ICD-10-CM

## 2018-04-30 DIAGNOSIS — G8929 Other chronic pain: Secondary | ICD-10-CM

## 2018-04-30 DIAGNOSIS — K592 Neurogenic bowel, not elsewhere classified: Secondary | ICD-10-CM

## 2018-04-30 DIAGNOSIS — F0281 Dementia in other diseases classified elsewhere with behavioral disturbance: Secondary | ICD-10-CM

## 2018-04-30 DIAGNOSIS — G3183 Dementia with Lewy bodies: Secondary | ICD-10-CM

## 2018-04-30 DIAGNOSIS — K5900 Constipation, unspecified: Secondary | ICD-10-CM

## 2018-04-30 DIAGNOSIS — M810 Age-related osteoporosis without current pathological fracture: Secondary | ICD-10-CM

## 2018-04-30 DIAGNOSIS — G20A1 Parkinson's disease without dyskinesia, without mention of fluctuations: Secondary | ICD-10-CM

## 2018-04-30 NOTE — Progress Notes (Signed)
Patient ID: Kristina Carr, female   DOB: 04-19-1945, 73 y.o.   MRN: [id number]  Location:  [facility] Room Number: 073 memory care Place of Service:  SNF [phone]) Provider:   Gayland Curry, DO  Patient Care Team: Gayland Curry, DO as PCP - General (Geriatric Medicine) Gaynelle Arabian, MD as Consulting Physician (Orthopedic Surgery) Ricki Rodriguez, MD as Attending Physician (Psychiatry) Martinique, Amy, MD as Consulting Physician (Dermatology) Rana Snare, MD as Consulting Physician (Urology) Nyoka Cowden, Stephannie Li, PA-C as Consulting Physician (Neurology) Duffy, Creola Corn, LCSW as Social Worker (Licensed Clinical Social Worker) Conan Bowens, RN as Registered Nurse [facility] and Palliative Medicine)  Extended Emergency Contact Information Primary Emergency Contact: Arta Bruce Address: [address]          [place], [place] [address] Johnnette Litter of [place] Phone: [phone] Work Phone: [phone] Mobile Phone: [phone] Relation: Spouse Secondary Emergency Contact: Deatra James, [place] of [place] Phone: [phone] Relation: Son  Code Status: DNR Goals of care: Advanced Directive information Advanced Directives 04/30/2018  Does Patient Have a Medical Advance Directive? Yes  Type of Paramedic of [place];Living will;Out of facility DNR (pink MOST or yellow form)  Does patient want to make changes to medical advance directive? No - Patient declined  Copy of [place] in Chart? Yes - validated most recent copy scanned in chart (See row information)  Would patient like information on creating a medical advance directive? -  Pre-existing out of facility DNR order (yellow form or pink MOST form) Yellow form placed in chart (order not valid for inpatient use);Pink MOST form placed in chart (order not valid for inpatient use)     Chief Complaint   Patient presents with  . Medical Management of Chronic Issues    Routine Visit    HPI:  Pt is a 73 y.o. female seen today for medical management of chronic diseases including parkinson's and lewy body dementia.  She lives in memory care snf for long term care.  [name]' behaviors have gotten considerably worse lately.  She has been taking her cloths off at any time and place.  Just before I arrived in memory care in the morning, pt had a bm and took off all of there bottoms and was wandering around the unit.  Staff had to escort her to her room, get her cleaned up and dressed again.  They report she is constantly in everything and does not sit still.  She tries to go out the fire door to the enclosed outdoor space regularly.  She also pushes and shakes the doors when they don't open.  She and one of the other residents will get into verbal arguments in the sitting area if they wind up near one another and have to be separated.  The staff is considering a onesie type outfit for her at this point due to the frequency of this behavior.    Her weight is stable.  She does eat.    She does sleep at night.    She continues with increased dyskinetic movements.    Delusions persist despite high doses of seroquel.  [place].  She's a bit less tearful.    Past Medical History:  Diagnosis Date  . Abdominal pain, epigastric   . Abnormality of gait   . Anxiety state, unspecified   .  Arthritis   . Bladder cancer ([place]) 2011   DR. GRAPEY  . Cervicalgia   . Detrusor instability   . Esophageal reflux   . Flatulence, eructation, and gas pain   . Hiatal hernia   . History of recurrent UTIs   . Irritable bowel syndrome   . Lewy body dementia ([place])   . Lumbago   . Nonspecific elevation of levels of transaminase or lactic acid dehydrogenase (LDH)   . Other abnormal glucose   . Other and unspecified hyperlipidemia   . Other and unspecified hyperlipidemia   . Other chest  pain   . Other malaise and fatigue   . Pain in joint, site unspecified   . Palpitations   . Parkinson's disease    Dr Tonye Royalty, [facility]  . Personal history of other disorders of nervous system and sense organs   . PONV (postoperative nausea and vomiting)   . Restless leg syndrome   . Vitamin D deficiency    Past Surgical History:  Procedure Laterality Date  . APPENDECTOMY    . BLADDER TUMOR EXCISION    . BREAST SURGERY     Biopsy-benign  . BUNIONECTOMY     left  . CESAREAN SECTION    . CHEMOTHERAPY     FLUSH-CANCER EXCISED CYSTOSCOPICALLY FOLLOWED BY INSTALLATION OF CHEMOTHERAPY.  . COLONOSCOPY  2011   neg; Dr Sharlett Iles  . LUMBAR LAMINECTOMY     X 3; last @ age34  . TOTAL HIP ARTHROPLASTY Left 04/12/2012   Procedure: TOTAL HIP ARTHROPLASTY ANTERIOR APPROACH;  Surgeon: Gearlean Alf, MD;  Location: [facility];  Service: Orthopedics;  Laterality: Left;  . TRIGGER FINGER RELEASE     X2  . UPPER GI ENDOSCOPY     hiatal hernia    Allergies  Allergen Reactions  . Omeprazole     REACTION: HIVES Because of a history of documented adverse serious drug reaction;Medi Alert bracelet  is recommended  . Simvastatin     REACTION: LEG CRAMPS  . Venlafaxine     REACTION: nausea    Outpatient Encounter Medications as of 04/30/2018  Medication Sig  . acetaminophen (TYLENOL) 325 MG tablet Take 650 mg by mouth 3 (three) times daily.   . carbidopa-levodopa (SINEMET IR) 25-100 MG per tablet Take 2 tablets by mouth 4 (four) times daily.   . divalproex (DEPAKOTE) 125 MG DR tablet Take 1 tablet (125 mg total) by mouth 3 (three) times daily.  Marland Kitchen donepezil (ARICEPT) 10 MG tablet Take 10 mg at bedtime by mouth.   . Melatonin 3 MG TABS Take 6 mg by mouth at bedtime.  Marland Kitchen PARoxetine (PAXIL) 40 MG tablet Take 1 tablet by mouth every morning.  Marland Kitchen QUEtiapine (SEROQUEL) 50 MG tablet Take 150 mg by mouth at bedtime.  . sennosides-docusate sodium (SENOKOT-S) 8.6-50 MG tablet Take 2 tablets by mouth every other day.   . [DISCONTINUED] PARoxetine (PAXIL) 20 MG tablet Take 40 mg by mouth every morning.   . [DISCONTINUED] polyethylene glycol (MIRALAX / GLYCOLAX) packet Take 17 g by mouth every three (3) days as needed.  . [DISCONTINUED] QUEtiapine (SEROQUEL) 100 MG tablet Take 200 mg by mouth at bedtime.  . [DISCONTINUED] QUEtiapine (SEROQUEL) 200 MG tablet   . [DISCONTINUED] RABEprazole (ACIPHEX) 20 MG tablet Take 1 tablet (20 mg total) by mouth daily.   No facility-administered encounter medications on file as of 04/30/2018.     Review of Systems  Unable to perform ROS: Dementia (see hpi for reported concerns)  Constitutional:  Positive for activity change. Negative for appetite change and fever.  HENT: Negative for hearing loss.   Gastrointestinal: Negative for abdominal pain and constipation.  Genitourinary:       Incontinence  Musculoskeletal: Positive for gait problem.  Neurological: Positive for tremors. Negative for dizziness.       Dyskinesias/akathisia  Psychiatric/Behavioral: Positive for agitation, behavioral problems, confusion, decreased concentration and hallucinations. Negative for sleep disturbance.    Immunization History  Administered Date(s) Administered  . Influenza Whole 11/15/2001, 11/08/2009  . Influenza, High Dose Seasonal PF 12/22/2015, 11/24/2016  . Influenza,inj,Quad PF,6+ Mos 03/11/2014, 11/23/2014, 11/27/2017  . Influenza-Unspecified 10/07/2012  . Pneumococcal Conjugate-13 11/23/2014  . Pneumococcal Polysaccharide-23 11/24/2015  . Td 02/09/1992, 07/20/2008  . Zoster 11/14/2010  . Zoster Recombinat (Shingrix) 11/12/2017, 02/18/2018   Pertinent  Health Maintenance Due  Topic Date Due  . COLONOSCOPY  03/21/2020  . INFLUENZA VACCINE  Completed  . DEXA SCAN  Completed  . PNA vac Low Risk Adult  Completed  . MAMMOGRAM  Discontinued   Fall Risk  12/05/2017 11/28/2016 07/12/2016 03/15/2016 11/24/2015  Falls in the past year? No Yes Yes No Yes  Number falls in past yr: - 2  or more 2 or more - 1  Injury with Fall? - No No - Yes  Risk for fall due to : - - - - -     Vitals:   04/30/18 1050  BP: 114/74  Pulse: 73  Resp: 16  Temp: (!) 96.8 F (36 C)  TempSrc: Oral  SpO2: 98%  Weight: 145 lb (65.8 kg)  Height: 5\' 6"  (1.676 m)   Body mass index is 23.4 kg/m. Physical Exam Vitals signs reviewed.  Constitutional:      Comments: Fidgety, could not focus even for a few seconds to respond to questions or be redirected; moving continuously--walking around unit, into rooms, through doors, etc.  HENT:     Head: Normocephalic and atraumatic.  Cardiovascular:     Rate and Rhythm: Normal rate and regular rhythm.     Pulses: Normal pulses.     Heart sounds: Normal heart sounds.  Pulmonary:     Effort: Pulmonary effort is normal.     Breath sounds: Normal breath sounds.  Abdominal:     General: Bowel sounds are normal. There is no distension.     Palpations: Abdomen is soft.     Tenderness: There is no abdominal tenderness.  Musculoskeletal:     Comments: Ambulates w/o assistive device ([place]viously [place] in [place] but often forgot it)  Skin:    General: Skin is warm and dry.     Coloration: Skin is pale.  Neurological:     Mental Status: She is alert.     Comments: Wandering around unit, shuffling, very unsteady lopsided gait, festinating; tremor and akathisia present  Psychiatric:     Comments: Appears to be distracted by internal stimuli others cannot identify; glassy-eyed, distant look     Labs reviewed: Recent Labs    09/03/17  NA 143  K 4.4  BUN 25*  CREATININE 0.7   Recent Labs    09/03/17  AST 21  ALT 7  ALKPHOS 86   Recent Labs    09/03/17  WBC 4.8  HGB 13.6  HCT 40  PLT 264   Lab Results  Component Value Date   TSH 1.43 10/17/2012   Lab Results  Component Value Date   HGBA1C 6.0 02/26/2012   Lab Results  Component Value Date  CHOL 193 07/04/2016   HDL 43 07/04/2016   LDLCALC 116 07/04/2016   LDLDIRECT 135.1  02/26/2012   TRIG 171 (A) 07/04/2016   CHOLHDL 5 02/26/2012    Assessment/Plan 1. Lewy body dementia with behavioral disturbance ([place]) -continues on depakote for mood stabilization though I don't really think it's been effective as far as I can tell -on aricept still at this point and I don't see her benefiting from this -melatonin for sleep -paxil for depression--difficult to tell this now--her cognitive status has gone down so much--previously would express her depression clearly and had tearful spells -seroquel for her hallucinations--is down to 150mg  from 200mg  in case her dyskinesias were related to this but may simply be from her large doses of sinemet--not sure this change affected her though  2. PARKINSON'S DISEASE -cont sinemet 2 tabs qid  3. 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 Chronic midline low back pain without sciatica -on scheduled tylenol 650mg  po tid, not reporting pain or grabbing her back which she did historically do when she got tired and had pain there  5. Senile osteoporosis -high risk of falls, but avoiding addition of extra medications for her at this time due to challenges with care and palliative goals  Family/ staff Communication: discussed with memory care nurse  Labs/tests ordered:  No new  Tulsi Crossett L. Anthoney Sheppard, D.O. [facility] [address], [place] [address] Cell Phone (Mon-Fri 8am-5pm):  [phone] On Call:  [phone] & follow prompts after 5pm & weekends Office Phone:  [phone] Office Fax:  [phone]

## 2018-05-01 NOTE — Progress Notes (Signed)
COMMUNITY PALLIATIVE CARE RN NOTE  PATIENT NAME: AZANI BROGDON DOB: 08-13-1945 MRN: [id number]  PRIMARY CARE PROVIDER: Gayland Curry, DO  RESPONSIBLE PARTY:  Acct ID - Guarantor Home Phone Work Phone Relationship Acct Type  [id number] SUKI, CROCKETT* [phone] Self P/F     [place], [place], [place] [address]    PLAN OF CARE and INTERVENTION:  1. ADVANCE CARE PLANNING/GOALS OF CARE: Goal is to remain at current facility on the Memory Care Unit. She is a DNR. 2. PATIENT/CAREGIVER EDUCATION: Reinforced Safe Mobility/Transfers and Redirection Techniques 3. DISEASE STATUS: Met with patient on the Memory Care unit at the facility. She is sitting up in a chair in the living room area awake. She has some continuous body movements, however appear to be milder than noted on previous visit about 2 weeks ago. She remains continuously confused. Speech is minimal and nonsensical. At one point during the visit, patient did stand up and start wondering around the unit. She began pushing a garbage cart down the hall. CNA was able to redirect her and take her to her room. She continues to episodes where she refuses care. At times she requires 2 person assistance during personal care and dressing. No physical indicators of pain noted. She is incontinent of both bowel and bladder and wears Depends. Her intake remains normal. Staff denies noticing any coughing/choking during meals. Her weights have been stable. No changes made to her medications since last visit. Will continue to monitor.  HISTORY OF PRESENT ILLNESS: This is a 73 yo female who resides at [facility] on the [address]. Palliative Care Team continues to follow patient. Will continue to visit monthly and PRN.   CODE STATUS: DNR ADVANCED DIRECTIVES: Y MOST FORM: yes PPS: 50%   PHYSICAL EXAM:   VITALS: Today's Vitals   04/29/18 1138  BP: 114/74  Pulse: 73  Resp: 16  Temp: (!) 96.8 F (36  C)  TempSrc: Tympanic  SpO2: 96%  Weight: 145 lb 9.6 oz (66 kg)  PainSc: 0-No pain    LUNGS: No dyspnea noted CARDIAC: Cor RRR EXTREMITIES: No edema SKIN: Exposed skin is dry and intact  NEURO: Continuously confused, nonsensical speech, continual body movements from Parkinson's disease, ambulatory with stand by assistance   (Duration of visit and documentation 60 minutes)    Daryl Eastern, RN BSN

## 2018-05-24 ENCOUNTER — Encounter: Payer: Self-pay | Admitting: Adult Health

## 2018-05-24 ENCOUNTER — Non-Acute Institutional Stay (SKILLED_NURSING_FACILITY): Payer: Medicare Other | Admitting: Adult Health

## 2018-05-24 DIAGNOSIS — F5101 Primary insomnia: Secondary | ICD-10-CM | POA: Diagnosis not present

## 2018-05-24 DIAGNOSIS — K5900 Constipation, unspecified: Secondary | ICD-10-CM

## 2018-05-24 DIAGNOSIS — G3183 Dementia with Lewy bodies: Secondary | ICD-10-CM

## 2018-05-24 DIAGNOSIS — M19012 Primary osteoarthritis, left shoulder: Secondary | ICD-10-CM | POA: Diagnosis not present

## 2018-05-24 DIAGNOSIS — M8589 Other specified disorders of bone density and structure, multiple sites: Secondary | ICD-10-CM

## 2018-05-24 DIAGNOSIS — G20 Parkinson's disease: Secondary | ICD-10-CM | POA: Diagnosis not present

## 2018-05-24 DIAGNOSIS — M8580 Other specified disorders of bone density and structure, unspecified site: Secondary | ICD-10-CM | POA: Insufficient documentation

## 2018-05-24 DIAGNOSIS — F0280 Dementia in other diseases classified elsewhere without behavioral disturbance: Secondary | ICD-10-CM

## 2018-05-24 DIAGNOSIS — K592 Neurogenic bowel, not elsewhere classified: Secondary | ICD-10-CM

## 2018-05-24 NOTE — Progress Notes (Signed)
Location:  Occupational psychologist of Service:  SNF (31) Provider:   Cindi Carbon, ANP [place] [phone]   Gayland Curry, DO  Patient Care Team: Gayland Curry, DO as PCP - General (Geriatric Medicine) Gaynelle Arabian, MD as Consulting Physician (Orthopedic Surgery) Ricki Rodriguez, MD as Attending Physician (Psychiatry) Martinique, Amy, MD as Consulting Physician (Dermatology) Rana Snare, MD as Consulting Physician (Urology) Nyoka Cowden, Stephannie Li, PA-C as Consulting Physician (Neurology) Duffy, Creola Corn, LCSW as Social Worker (Licensed Clinical Social Worker) Conan Bowens, RN as Registered Nurse (Hospice and Palliative Medicine)  Extended Emergency Contact Information Primary Emergency Contact: Arta Bruce Address: [address]          [place], [place] [address] Johnnette Litter of [place] Phone: [phone] Work Phone: [phone] Mobile Phone: [phone] Relation: Spouse Secondary Emergency Contact: Deatra James, [place] of [place] Phone: [phone] Relation: Son  Code Status:  DNR Goals of care: Advanced Directive information Advanced Directives 04/30/2018  Does Patient Have a Medical Advance Directive? Yes  Type of Paramedic of [place];Living will;Out of facility DNR (pink MOST or yellow form)  Does patient want to make changes to medical advance directive? No - Patient declined  Copy of [place] in Chart? Yes - validated most recent copy scanned in chart (See row information)  Would patient like information on creating a medical advance directive? -  Pre-existing out of facility DNR order (yellow form or pink MOST form) Yellow form placed in chart (order not valid for inpatient use);Pink MOST form placed in chart (order not valid for inpatient use)     Chief Complaint  Patient presents with  . Medical Management of Chronic Issues     HPI:  Pt is a 73 y.o. female seen today for medical management of chronic diseases.    LBD: Continues with delusions and constant fidgeting per staff. Slight improvement in both of these issus is noted with increase in depakote and decrease in seroquel done in march of 2020 She continues to be a high fall risk and sustained a fall on 4/14 with a skin tear to her chin but no major injuries.   No reports of constipation using senokot s for neurogenic bowel  PD: festinating gait, unsteady gait, chorea like movements to arms and legs noted during my visit, slightly less with reduction of seroquel  Hx of OA to the left shoulder, low back pain, and OA to the hip on scheduled tylenol. No reports of pain for my visit.   Osteopenia: March of 2017 DEXA scan ASSESSMENT: The BMD measured at Femur Neck is 0.824 g/cm2 with a T-score of -1.5. This patient is considered osteopenic according to [facility]) criteria  Functional status: ambulatory, intermittently incontinent  Past Medical History:  Diagnosis Date  . Abdominal pain, epigastric   . Abnormality of gait   . Anxiety state, unspecified   . Arthritis   . Bladder cancer ([place]) 2011   DR. GRAPEY  . Cervicalgia   . Detrusor instability   . Esophageal reflux   . Flatulence, eructation, and gas pain   . Hiatal hernia   . History of recurrent UTIs   . Irritable bowel syndrome   . Lewy body dementia ([place])   . Lumbago   . Nonspecific elevation of levels of transaminase or lactic acid dehydrogenase (LDH)   . Other abnormal glucose   .  Other and unspecified hyperlipidemia   . Other and unspecified hyperlipidemia   . Other chest pain   . Other malaise and fatigue   . Pain in joint, site unspecified   . Palpitations   . Parkinson's disease    Dr Tonye Royalty, [facility]  . Personal history of other disorders of nervous system and sense organs   . PONV (postoperative nausea and vomiting)   . Restless leg syndrome   . Vitamin D  deficiency    Past Surgical History:  Procedure Laterality Date  . APPENDECTOMY    . BLADDER TUMOR EXCISION    . BREAST SURGERY     Biopsy-benign  . BUNIONECTOMY     left  . CESAREAN SECTION    . CHEMOTHERAPY     FLUSH-CANCER EXCISED CYSTOSCOPICALLY FOLLOWED BY INSTALLATION OF CHEMOTHERAPY.  . COLONOSCOPY  2011   neg; Dr Sharlett Iles  . LUMBAR LAMINECTOMY     X 3; last @ age34  . TOTAL HIP ARTHROPLASTY Left 04/12/2012   Procedure: TOTAL HIP ARTHROPLASTY ANTERIOR APPROACH;  Surgeon: Gearlean Alf, MD;  Location: [facility];  Service: Orthopedics;  Laterality: Left;  . TRIGGER FINGER RELEASE     X2  . UPPER GI ENDOSCOPY     hiatal hernia    Allergies  Allergen Reactions  . Omeprazole     REACTION: HIVES Because of a history of documented adverse serious drug reaction;Medi Alert bracelet  is recommended  . Simvastatin     REACTION: LEG CRAMPS  . Venlafaxine     REACTION: nausea    Outpatient Encounter Medications as of 05/24/2018  Medication Sig  . acetaminophen (TYLENOL) 325 MG tablet Take 650 mg by mouth 3 (three) times daily.   . carbidopa-levodopa (SINEMET IR) 25-100 MG per tablet Take 2 tablets by mouth 4 (four) times daily.   . divalproex (DEPAKOTE SPRINKLE) 125 MG capsule Take 250 mg by mouth 2 (two) times daily.  . divalproex (DEPAKOTE) 125 MG DR tablet Take 1 tablet (125 mg total) by mouth 3 (three) times daily. (Patient taking differently: Take 125 mg by mouth daily with lunch. )  . donepezil (ARICEPT) 10 MG tablet Take 10 mg at bedtime by mouth.   . Melatonin 3 MG TABS Take 6 mg by mouth at bedtime.  Marland Kitchen PARoxetine (PAXIL) 40 MG tablet Take 1 tablet by mouth every morning.  Marland Kitchen QUEtiapine (SEROQUEL) 50 MG tablet Take 150 mg by mouth at bedtime.  . sennosides-docusate sodium (SENOKOT-S) 8.6-50 MG tablet Take 2 tablets by mouth every other day.  . [DISCONTINUED] RABEprazole (ACIPHEX) 20 MG tablet Take 1 tablet (20 mg total) by mouth daily.   No facility-administered  encounter medications on file as of 05/24/2018.     Review of Systems  Unable to perform ROS: Dementia    Immunization History  Administered Date(s) Administered  . Influenza Whole 11/15/2001, 11/08/2009  . Influenza, High Dose Seasonal PF 12/22/2015, 11/24/2016  . Influenza,inj,Quad PF,6+ Mos 03/11/2014, 11/23/2014, 11/27/2017  . Influenza-Unspecified 10/07/2012  . Pneumococcal Conjugate-13 11/23/2014  . Pneumococcal Polysaccharide-23 11/24/2015  . Td 02/09/1992, 07/20/2008, 05/20/2018  . Zoster 11/14/2010  . Zoster Recombinat (Shingrix) 11/12/2017, 02/18/2018   Pertinent  Health Maintenance Due  Topic Date Due  . INFLUENZA VACCINE  09/07/2018  . COLONOSCOPY  03/21/2020  . DEXA SCAN  Completed  . PNA vac Low Risk Adult  Completed  . MAMMOGRAM  Discontinued   Fall Risk  12/05/2017 11/28/2016 07/12/2016 03/15/2016 11/24/2015  Falls in the past year? No Yes  Yes No Yes  Number falls in past yr: - 2 or more 2 or more - 1  Injury with Fall? - No No - Yes  Risk for fall due to : - - - - -   Functional Status Survey:    Vitals:   05/24/18 1100  Weight: 141 lb 6.4 oz (64.1 kg)   Body mass index is 22.82 kg/m. Physical Exam Vitals signs and nursing note reviewed.  Constitutional:      General: She is not in acute distress.    Appearance: She is not diaphoretic.  HENT:     Head: Normocephalic and atraumatic.     Nose: Rhinorrhea present. No congestion.     Mouth/Throat:     Mouth: Mucous membranes are moist.     Pharynx: Oropharynx is clear. No oropharyngeal exudate.  Eyes:     Conjunctiva/sclera: Conjunctivae normal.     Pupils: Pupils are equal, round, and reactive to light.  Neck:     Musculoskeletal: No muscular tenderness.     Vascular: No JVD.  Cardiovascular:     Rate and Rhythm: Normal rate and regular rhythm.     Heart sounds: No murmur.  Pulmonary:     Effort: Pulmonary effort is normal. No respiratory distress.     Breath sounds: Normal breath sounds. No  wheezing.  Abdominal:     General: Abdomen is flat. Bowel sounds are normal. There is no distension.     Palpations: Abdomen is soft.     Tenderness: There is no abdominal tenderness.  Musculoskeletal:        General: No swelling, tenderness, deformity or signs of injury.     Right lower leg: No edema.     Left lower leg: No edema.  Lymphadenopathy:     Cervical: No cervical adenopathy.  Skin:    General: Skin is warm and dry.  Neurological:     General: No focal deficit present.     Mental Status: She is alert. Mental status is at baseline.     Comments: MAE, no rigidity or tremor. Has chorea like movements to arms and legs, slightly improved from last visit.   Psychiatric:        Mood and Affect: Mood normal.     Labs reviewed: Recent Labs    09/03/17  NA 143  K 4.4  BUN 25*  CREATININE 0.7   Recent Labs    09/03/17 04/08/18  AST 21 12*  ALT 7 14  ALKPHOS 86 57   Recent Labs    09/03/17 04/08/18  WBC 4.8 6.5  HGB 13.6 14.1  HCT 40 42  PLT 264 310   Lab Results  Component Value Date   TSH 1.43 10/17/2012   Lab Results  Component Value Date   HGBA1C 6.0 02/26/2012   Lab Results  Component Value Date   CHOL 193 07/04/2016   HDL 43 07/04/2016   LDLCALC 116 07/04/2016   LDLDIRECT 135.1 02/26/2012   TRIG 171 (A) 07/04/2016   CHOLHDL 5 02/26/2012    Significant Diagnostic Results in last 30 days:  No results found.  Assessment/Plan 1. Lewy body dementia without behavioral disturbance ([facility]) Slight improvement in mood with Depakote 250 mg bid and 125 mg at lunch. Dep level 20 04/08/18  2. PARKINSON'S DISEASE Slight improvement in repetitive movements with reduction in seroquel to 150 mg qhs Continue sinemet as recommended by neuro Consider further reduction in seroquel   3. Primary osteoarthritis of left shoulder Continue scheduled  tylenol tid  4. Primary insomnia Continue melatonin 6 mg qhs  5. 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 Osteopenia Noted in 2017 on dexa scan  No longer going out for dexa scans due to progressive dementia with behavioral issues. Also having difficulty getting her to take her medications so will avoid additional supplements.   Family/ staff Communication: staff  Labs/tests ordered:  NA

## 2018-05-25 DIAGNOSIS — R079 Chest pain, unspecified: Secondary | ICD-10-CM | POA: Diagnosis not present

## 2018-06-06 ENCOUNTER — Telehealth: Payer: Self-pay | Admitting: Licensed Clinical Social Worker

## 2018-06-06 NOTE — Telephone Encounter (Signed)
Palliative Care SW left a vm for patient's husband to schedule a telehealth visit.

## 2018-06-13 ENCOUNTER — Non-Acute Institutional Stay (SKILLED_NURSING_FACILITY): Payer: Medicare Other | Admitting: Adult Health

## 2018-06-13 ENCOUNTER — Encounter: Payer: Self-pay | Admitting: Adult Health

## 2018-06-13 DIAGNOSIS — C679 Malignant neoplasm of bladder, unspecified: Secondary | ICD-10-CM

## 2018-06-13 DIAGNOSIS — R2689 Other abnormalities of gait and mobility: Secondary | ICD-10-CM

## 2018-06-13 DIAGNOSIS — M539 Dorsopathy, unspecified: Secondary | ICD-10-CM | POA: Diagnosis not present

## 2018-06-13 DIAGNOSIS — F0280 Dementia in other diseases classified elsewhere without behavioral disturbance: Secondary | ICD-10-CM

## 2018-06-13 DIAGNOSIS — G3183 Dementia with Lewy bodies: Secondary | ICD-10-CM | POA: Diagnosis not present

## 2018-06-13 DIAGNOSIS — G20 Parkinson's disease: Secondary | ICD-10-CM

## 2018-06-13 DIAGNOSIS — F323 Major depressive disorder, single episode, severe with psychotic features: Secondary | ICD-10-CM

## 2018-06-13 DIAGNOSIS — R7309 Other abnormal glucose: Secondary | ICD-10-CM

## 2018-06-13 DIAGNOSIS — M19012 Primary osteoarthritis, left shoulder: Secondary | ICD-10-CM | POA: Diagnosis not present

## 2018-06-13 NOTE — Progress Notes (Signed)
Provider:   Cindi Carbon, ANP [place] [phone] Location: [place] of Service:  SNF (31)   PCP: Gayland Curry, DO Patient Care Team: Gayland Curry, DO as PCP - General (Geriatric Medicine) Gaynelle Arabian, MD as Consulting Physician (Orthopedic Surgery) Ricki Rodriguez, MD as Attending Physician (Psychiatry) Martinique, Amy, MD as Consulting Physician (Dermatology) Rana Snare, MD as Consulting Physician (Urology) Nyoka Cowden, Stephannie Li, PA-C as Consulting Physician (Neurology) Duffy, Creola Corn, LCSW as Social Worker (Licensed Clinical Social Worker) Conan Bowens, RN as Registered Nurse [facility] and Palliative Medicine)  Extended Emergency Contact Information Primary Emergency Contact: Arta Bruce Address: [address]          [place], [place] [address] Johnnette Litter of [place] Phone: [phone] Work Phone: [phone] Mobile Phone: [phone] Relation: Spouse Secondary Emergency Contact: Deatra James, [place] of [employer] Phone: [phone] Relation: Son  Code Status: DNR Goals of Care: Advanced Directive information Advanced Directives 04/30/2018  Does Patient Have a Medical Advance Directive? Yes  Type of Paramedic of [place];Living will;Out of facility DNR (pink MOST or yellow form)  Does patient want to make changes to medical advance directive? No - Patient declined  Copy of [place] in Chart? Yes - validated most recent copy scanned in chart (See row information)  Would patient like information on creating a medical advance directive? -  Pre-existing out of facility DNR order (yellow form or pink MOST form) Yellow form placed in chart (order not valid for inpatient use);Pink MOST form placed in chart (order not valid for inpatient use)     Chief Complaint  Patient presents with  . Annual Exam    HPI: Patient is a  73 y.o. female seen today for an annual comprehensive examination. She has severe Lewy Body dementia and Parkinson's disease. She resides in the memory care unit. The staff report that over the past few months she has seemed to decline with worsening chorea like, dystonic movements and less verbalization.  She is ambulatory and wanders throughout the unit and tends to be restless.  Her body moves voluntarily upper and lower extremities to the point that it "wears her out".  She plays with items on the unit that are not toys and can become easily angered when re directed. I decreased the seroquel that she is on two months ago to see if that would help with her movements but it has not. They have not noted any hallucinations or delusions but she mumbles frequently and is not able to provide a history.   Past Medical History:  Diagnosis Date  . Abdominal pain, epigastric   . Abnormality of gait   . Anxiety state, unspecified   . Arthritis   . Bladder cancer ([place]) 2011   DR. GRAPEY  . Cervicalgia   . Detrusor instability   . Esophageal reflux   . Flatulence, eructation, and gas pain   . Hiatal hernia   . History of recurrent UTIs   . Irritable bowel syndrome   . Lewy body dementia ([place])   . Lumbago   . Nonspecific elevation of levels of transaminase or lactic acid dehydrogenase (LDH)   . Other abnormal glucose   . Other and unspecified hyperlipidemia   . Other and unspecified hyperlipidemia   . Other chest pain   . Other malaise and fatigue   . Pain in joint,  site unspecified   . Palpitations   . Parkinson's disease    Dr Tonye Royalty, [facility]  . Personal history of other disorders of nervous system and sense organs   . PONV (postoperative nausea and vomiting)   . Restless leg syndrome   . Vitamin D deficiency    Past Surgical History:  Procedure Laterality Date  . APPENDECTOMY    . BLADDER TUMOR EXCISION    . BREAST SURGERY     Biopsy-benign  . BUNIONECTOMY     left  . CESAREAN SECTION     . CHEMOTHERAPY     FLUSH-CANCER EXCISED CYSTOSCOPICALLY FOLLOWED BY INSTALLATION OF CHEMOTHERAPY.  . COLONOSCOPY  2011   neg; Dr Sharlett Iles  . LUMBAR LAMINECTOMY     X 3; last @ age34  . TOTAL HIP ARTHROPLASTY Left 04/12/2012   Procedure: TOTAL HIP ARTHROPLASTY ANTERIOR APPROACH;  Surgeon: Gearlean Alf, MD;  Location: [facility];  Service: Orthopedics;  Laterality: Left;  . TRIGGER FINGER RELEASE     X2  . UPPER GI ENDOSCOPY     hiatal hernia    reports that she has never smoked. She has never used smokeless tobacco. She reports that she does not drink alcohol or use drugs. Social History   Socioeconomic History  . Marital status: Married    Spouse name: Not on file  . Number of children: 1  . Years of education: Not on file  . Highest education level: Not on file  Occupational History  . Occupation: retired    Fish farm manager: UNEMPLOYED    Comment: [employer]  Social Needs  . Financial resource strain: Not hard at all  . Food insecurity:    Worry: Never true    Inability: Never true  . Transportation needs:    Medical: No    Non-medical: No  Tobacco Use  . Smoking status: Never Smoker  . Smokeless tobacco: Never Used  . Tobacco comment: social  Substance and Sexual Activity  . Alcohol use: No  . Drug use: No  . Sexual activity: Yes    Birth control/protection: Post-menopausal  Lifestyle  . Physical activity:    Days per week: 0 days    Minutes per session: 0 min  . Stress: Not at all  Relationships  . Social connections:    Talks on phone: More than three times a week    Gets together: More than three times a week    Attends religious service: Never    Active member of club or organization: No    Attends meetings of clubs or organizations: Never    Relationship status: Married  Other Topics Concern  . Not on file  Social History Narrative   Lives at [facility]    Married -[name]   Never smoked   Alcohol none   Exercise none   POA   Family History  Problem  Relation Age of Onset  . Lung cancer Father   . Colon cancer Mother   . Hypertension Mother   . Breast cancer Mother 36  . Stomach cancer Maternal Grandmother   . Osteoporosis Maternal Grandmother   . Breast cancer Maternal Aunt 30  . Prostate cancer Paternal Grandfather   . Prostate cancer Other        Paternal Great Grandfather  . Prostate cancer Maternal Grandfather   . Colon polyps Sister   . Diabetes Neg Hx   . Stroke Neg Hx   . Heart attack Neg Hx     Pertinent  Health Maintenance Due  Topic Date Due  . INFLUENZA VACCINE  09/07/2018  . COLONOSCOPY  03/21/2020  . DEXA SCAN  Completed  . PNA vac Low Risk Adult  Completed  . MAMMOGRAM  Discontinued   Fall Risk  12/05/2017 11/28/2016 07/12/2016 03/15/2016 11/24/2015  Falls in the past year? No Yes Yes No Yes  Number falls in past yr: - 2 or more 2 or more - 1  Injury with Fall? - No No - Yes  Risk for fall due to : - - - - -   Depression screen [facility] 2/9 12/05/2017 11/28/2016 07/12/2016 03/15/2016 11/24/2015  Decreased Interest 3 3 0 0 0  Down, Depressed, Hopeless 3 3 0 0 0  PHQ - 2 Score 6 6 0 0 0  Altered sleeping 1 1 - - -  Tired, decreased energy 1 3 - - -  Change in appetite 1 1 - - -  Feeling bad or failure about yourself  0 0 - - -  Trouble concentrating 1 2 - - -  Moving slowly or fidgety/restless 0 0 - - -  Suicidal thoughts 0 0 - - -  PHQ-9 Score 10 13 - - -  Difficult doing work/chores Somewhat difficult Very difficult - - -    Functional Status Survey:    Allergies  Allergen Reactions  . Omeprazole     REACTION: HIVES Because of a history of documented adverse serious drug reaction;Medi Alert bracelet  is recommended  . Simvastatin     REACTION: LEG CRAMPS  . Venlafaxine     REACTION: nausea    Outpatient Encounter Medications as of 06/13/2018  Medication Sig  . acetaminophen (TYLENOL) 325 MG tablet Take 650 mg by mouth 3 (three) times daily.   . carbidopa-levodopa (SINEMET IR) 25-100 MG per tablet  Take 2 tablets by mouth 4 (four) times daily.   . divalproex (DEPAKOTE SPRINKLE) 125 MG capsule Take 250 mg by mouth 2 (two) times daily. 250 mg each morning and evening and 125 mg at lunch  . donepezil (ARICEPT) 10 MG tablet Take 10 mg at bedtime by mouth.   . Melatonin 3 MG TABS Take 6 mg by mouth at bedtime.  Marland Kitchen PARoxetine (PAXIL) 40 MG tablet Take 1 tablet by mouth every morning.  Marland Kitchen QUEtiapine (SEROQUEL) 50 MG tablet Take 150 mg by mouth at bedtime.  . sennosides-docusate sodium (SENOKOT-S) 8.6-50 MG tablet Take 2 tablets by mouth every other day.  . [DISCONTINUED] divalproex (DEPAKOTE) 125 MG DR tablet Take 1 tablet (125 mg total) by mouth 3 (three) times daily. (Patient taking differently: Take 125 mg by mouth daily with lunch. )  . [DISCONTINUED] RABEprazole (ACIPHEX) 20 MG tablet Take 1 tablet (20 mg total) by mouth daily.   No facility-administered encounter medications on file as of 06/13/2018.     Review of Systems  Unable to perform ROS: Dementia    Vitals:   06/13/18 1359  BP: 115/71  Pulse: 69  Resp: 16  Temp: (!) 96.8 F (36 C)  SpO2: 97%  Weight: 138 lb 1.6 oz (62.6 kg)   Body mass index is 22.29 kg/m. Physical Exam Exam conducted with a chaperone present.  Constitutional:      General: She is not in acute distress.    Appearance: She is normal weight. She is not diaphoretic.  HENT:     Head: Normocephalic and atraumatic.     Right Ear: Tympanic membrane and external ear normal.     Left  Ear: Tympanic membrane and ear canal normal.     Nose: Nose normal. No congestion.     Mouth/Throat:     Mouth: Mucous membranes are moist.     Pharynx: Oropharynx is clear. No oropharyngeal exudate.  Eyes:     General:        Right eye: No discharge.        Left eye: No discharge.     Conjunctiva/sclera: Conjunctivae normal.     Pupils: Pupils are equal, round, and reactive to light.  Neck:     Musculoskeletal: No neck rigidity or muscular tenderness.     Vascular: No  JVD.  Cardiovascular:     Rate and Rhythm: Normal rate and regular rhythm.     Heart sounds: No murmur.  Pulmonary:     Effort: Pulmonary effort is normal. No respiratory distress.     Breath sounds: Normal breath sounds. No wheezing.  Chest:     Chest wall: No mass, lacerations or deformity.     Breasts:        Right: Normal. No swelling, bleeding, inverted nipple or mass.        Left: Normal. No swelling, bleeding, inverted nipple or mass.  Abdominal:     General: Abdomen is flat. Bowel sounds are normal.     Palpations: Abdomen is soft.  Musculoskeletal:        General: No swelling, tenderness, deformity or signs of injury.     Right lower leg: No edema.     Left lower leg: No edema.  Lymphadenopathy:     Cervical: No cervical adenopathy.  Skin:    General: Skin is warm and dry.  Neurological:     General: No focal deficit present.     Mental Status: She is alert.     Gait: Gait abnormal.     Comments: Had difficulty following commands or answering questions. Severe involuntary movements of the upper and lower extremities.   Psychiatric:     Comments: anxious     Labs reviewed: Basic Metabolic Panel: Recent Labs    09/03/17  NA 143  K 4.4  BUN 25*  CREATININE 0.7   Liver Function Tests: Recent Labs    09/03/17 04/08/18  AST 21 12*  ALT 7 14  ALKPHOS 86 57   No results for input(s): LIPASE, AMYLASE in the last 8760 hours. No results for input(s): AMMONIA in the last 8760 hours. CBC: Recent Labs    09/03/17 04/08/18  WBC 4.8 6.5  HGB 13.6 14.1  HCT 40 42  PLT 264 310   Cardiac Enzymes: No results for input(s): CKTOTAL, CKMB, CKMBINDEX, TROPONINI in the last 8760 hours. BNP: Invalid input(s): POCBNP Lab Results  Component Value Date   HGBA1C 6.0 02/26/2012   Lab Results  Component Value Date   TSH 1.43 10/17/2012   Lab Results  Component Value Date   VITAMINB12 269 10/17/2012   Lab Results  Component Value Date   FOLATE > 20.0 ng/mL  04/26/2007   Lab Results  Component Value Date   IRON 127 04/26/2007    Imaging and Procedures obtained recently: No results found.  Assessment/Plan 1. Lewy body dementia without behavioral disturbance (HCC) Progressively worse  Decrease seroquel to 100 mg qhs (not sure if this is helping her as she seems to have worsening PD like symptoms), follow up televisit with Dr. Milly Jakob neurology [facility] due to worsening in involuntary movements. ?adjustment to sinemet. Due to behaviors and unpredictability it would  not be advised that she leave the facility for a visit  2. PARKINSON'S DISEASE See above  3. Multilevel degenerative disc disease No reports of pain at this time.   4. Primary osteoarthritis of left shoulder No reports of pain at this time. Continue scheduled tylenol 650 mg tid.   5. Malignant neoplasm of urinary bladder, unspecified site (HCC) No current symptoms. Not followed at this time due to severe behaviors associated with LBD  6. Festinating gait Noted on exam. Not able to use a walker due to LBD  7. HYPERGLYCEMIA, FASTING Check BMP  8. Severe single current episode of major depressive disorder, with psychotic features ([place]) Continues on seroquel and Paxil. Will be decreasing seroquel. Would continue Paxil at 40 mg qd to avoid de stabilization in mood.   .  Family/ staff Communication: Staff/Husband Mr Milson he agreed to the above plan.   Labs/tests ordered: Dep level CMP TSH B12 06/17/18

## 2018-06-17 DIAGNOSIS — D649 Anemia, unspecified: Secondary | ICD-10-CM | POA: Diagnosis not present

## 2018-06-17 DIAGNOSIS — D519 Vitamin B12 deficiency anemia, unspecified: Secondary | ICD-10-CM | POA: Diagnosis not present

## 2018-06-17 DIAGNOSIS — E039 Hypothyroidism, unspecified: Secondary | ICD-10-CM | POA: Diagnosis not present

## 2018-06-17 DIAGNOSIS — Z79899 Other long term (current) drug therapy: Secondary | ICD-10-CM | POA: Diagnosis not present

## 2018-06-17 DIAGNOSIS — R569 Unspecified convulsions: Secondary | ICD-10-CM | POA: Diagnosis not present

## 2018-06-17 LAB — BASIC METABOLIC PANEL
BUN: 21 (ref 4–21)
Creatinine: 0.8 (ref 0.5–1.1)
Glucose: 94
Potassium: 4.2 (ref 3.4–5.3)
Sodium: 143 (ref 137–147)

## 2018-06-17 LAB — TSH: TSH: 1.79 (ref 0.41–5.90)

## 2018-06-17 LAB — VITAMIN B12: Vitamin B-12: 228

## 2018-07-03 DIAGNOSIS — Z79899 Other long term (current) drug therapy: Secondary | ICD-10-CM | POA: Diagnosis not present

## 2018-07-03 DIAGNOSIS — R6889 Other general symptoms and signs: Secondary | ICD-10-CM | POA: Diagnosis not present

## 2018-07-03 DIAGNOSIS — G249 Dystonia, unspecified: Secondary | ICD-10-CM | POA: Diagnosis not present

## 2018-07-03 DIAGNOSIS — R479 Unspecified speech disturbances: Secondary | ICD-10-CM | POA: Diagnosis not present

## 2018-07-03 DIAGNOSIS — G20 Parkinson's disease: Secondary | ICD-10-CM | POA: Diagnosis not present

## 2018-07-09 ENCOUNTER — Encounter: Payer: Self-pay | Admitting: Internal Medicine

## 2018-07-09 ENCOUNTER — Non-Acute Institutional Stay (SKILLED_NURSING_FACILITY): Payer: Medicare Other | Admitting: Internal Medicine

## 2018-07-09 DIAGNOSIS — M545 Low back pain, unspecified: Secondary | ICD-10-CM

## 2018-07-09 DIAGNOSIS — G3183 Dementia with Lewy bodies: Secondary | ICD-10-CM | POA: Diagnosis not present

## 2018-07-09 DIAGNOSIS — G20 Parkinson's disease: Secondary | ICD-10-CM

## 2018-07-09 DIAGNOSIS — F0280 Dementia in other diseases classified elsewhere without behavioral disturbance: Secondary | ICD-10-CM

## 2018-07-09 DIAGNOSIS — R258 Other abnormal involuntary movements: Secondary | ICD-10-CM | POA: Diagnosis not present

## 2018-07-09 DIAGNOSIS — K592 Neurogenic bowel, not elsewhere classified: Secondary | ICD-10-CM

## 2018-07-09 DIAGNOSIS — G20A1 Parkinson's disease without dyskinesia, without mention of fluctuations: Secondary | ICD-10-CM

## 2018-07-09 DIAGNOSIS — R634 Abnormal weight loss: Secondary | ICD-10-CM

## 2018-07-09 DIAGNOSIS — K5900 Constipation, unspecified: Secondary | ICD-10-CM

## 2018-07-09 DIAGNOSIS — G8929 Other chronic pain: Secondary | ICD-10-CM

## 2018-07-09 NOTE — Progress Notes (Signed)
Patient ID: Kristina Carr, female   DOB: [birth year], 73 y.o.   MRN: [id number]  Location:  [facility] Room Number: [place] of Service:  SNF [phone]) Provider:   Gayland Curry, DO  Patient Care Team: Gayland Curry, DO as PCP - General (Geriatric Medicine) Gaynelle Arabian, MD as Consulting Physician (Orthopedic Surgery) Ricki Rodriguez, MD as Attending Physician (Psychiatry) Martinique, Amy, MD as Consulting Physician (Dermatology) Rana Snare, MD as Consulting Physician (Urology) Nyoka Cowden, Stephannie Li, PA-C as Consulting Physician (Neurology) Duffy, Creola Corn, LCSW as Social Worker (Licensed Clinical Social Worker) Conan Bowens, RN as Registered Nurse [facility] and Palliative Medicine)  Extended Emergency Contact Information Primary Emergency Contact: Arta Bruce Address: [address]          [place], [place] [address] Johnnette Litter of [place] Phone: [phone] Work Phone: [phone] Mobile Phone: [phone] Relation: Spouse Secondary Emergency Contact: Deatra James, [place] of [place] Phone: [phone] Relation: Son  Code Status:  DNR, MOST Goals of care: Advanced Directive information Advanced Directives 04/30/2018  Does Patient Have a Medical Advance Directive? Yes  Type of Paramedic of [place];Living will;Out of facility DNR (pink MOST or yellow form)  Does patient want to make changes to medical advance directive? No - Patient declined  Copy of [place] in Chart? Yes - validated most recent copy scanned in chart (See row information)  Would patient like information on creating a medical advance directive? -  Pre-existing out of facility DNR order (yellow form or pink MOST form) Yellow form placed in chart (order not valid for inpatient use);Pink MOST form placed in chart (order not valid for inpatient use)     Chief  Complaint  Patient presents with  . Medical Management of Chronic Issues    Routine Visit    HPI:  Pt is a 73 y.o. female seen today for medical management of chronic diseases.    [name] had gradually been struggling more and more with choreiform movements the past several months.  Just last week, she had a neurology f/u with Dr. Tonye Royalty.  I had asked if there were any concerns that the movements were from the higher doses of seroquel or due to increasing doses of sinemet over time.  He agreed they were from her sinemet--she'd been on 2 tabs qid.  He suggested we try going down to 1.5 tabs qid and if that was helpful, keep it there, but, if she had not improved in a few days, to go down to one tablet.  [name]' movements looked considerably better the first day after the initial dose reduction, but then returned without much noticeable improvement.  So, yesterday, she was reduced to the one tablet qid.  When I saw her this morning, she was sitting calmly in a chair.  Her head was bobbing just slightly but no other movements were noted. She was also able to talk with me some and answer questions.  The last visit we had was very challenging with her moving and writhing around and it even made her speech hard to comprehend so this was much better (as long as it lasts).  He also advised to keep her aricept due to some benefits it's shown for these movements.  He did not believe the seroquel was a culprit (she's down to 100mg  from 150mg  due to lack of benefit and initial concern  that this was the cause).  She also remains on the depakote for mood stability.    Staff could not even feed her regular food due to her movements--her diet was downgraded to chopped so a spoon could be used (fork was too dangerous).  She requires two staff to be toileted and two people were needed to get her from climbing over the couch in the main area of the unit.    Past Medical History:  Diagnosis Date  . Abdominal pain, epigastric     . Abnormality of gait   . Anxiety state, unspecified   . Arthritis   . Bladder cancer ([place]) 2011   DR. GRAPEY  . Cervicalgia   . Detrusor instability   . Esophageal reflux   . Flatulence, eructation, and gas pain   . Hiatal hernia   . History of recurrent UTIs   . Irritable bowel syndrome   . Lewy body dementia ([place])   . Lumbago   . Nonspecific elevation of levels of transaminase or lactic acid dehydrogenase (LDH)   . Other abnormal glucose   . Other and unspecified hyperlipidemia   . Other and unspecified hyperlipidemia   . Other chest pain   . Other malaise and fatigue   . Pain in joint, site unspecified   . Palpitations   . Parkinson's disease    Dr Tonye Royalty, [facility]  . Personal history of other disorders of nervous system and sense organs   . PONV (postoperative nausea and vomiting)   . Restless leg syndrome   . Vitamin D deficiency    Past Surgical History:  Procedure Laterality Date  . APPENDECTOMY    . BLADDER TUMOR EXCISION    . BREAST SURGERY     Biopsy-benign  . BUNIONECTOMY     left  . CESAREAN SECTION    . CHEMOTHERAPY     FLUSH-CANCER EXCISED CYSTOSCOPICALLY FOLLOWED BY INSTALLATION OF CHEMOTHERAPY.  . COLONOSCOPY  2011   neg; Dr Sharlett Iles  . LUMBAR LAMINECTOMY     X 3; last @ age34  . TOTAL HIP ARTHROPLASTY Left 04/12/2012   Procedure: TOTAL HIP ARTHROPLASTY ANTERIOR APPROACH;  Surgeon: Gearlean Alf, MD;  Location: [facility];  Service: Orthopedics;  Laterality: Left;  . TRIGGER FINGER RELEASE     X2  . UPPER GI ENDOSCOPY     hiatal hernia    Allergies  Allergen Reactions  . Omeprazole     REACTION: HIVES Because of a history of documented adverse serious drug reaction;Medi Alert bracelet  is recommended  . Simvastatin     REACTION: LEG CRAMPS  . Venlafaxine     REACTION: nausea    Outpatient Encounter Medications as of 07/09/2018  Medication Sig  . acetaminophen (TYLENOL) 325 MG tablet Take 650 mg by mouth 3 (three) times daily.   .  carbidopa-levodopa (SINEMET IR) 25-100 MG per tablet Take 2 tablets by mouth 4 (four) times daily.   . divalproex (DEPAKOTE SPRINKLE) 125 MG capsule Take 250 mg by mouth 2 (two) times daily. 250 mg each morning and evening and 125 mg at lunch  . donepezil (ARICEPT) 10 MG tablet Take 10 mg at bedtime by mouth.   . Melatonin 3 MG TABS Take 6 mg by mouth at bedtime.  Marland Kitchen PARoxetine (PAXIL) 40 MG tablet Take 1 tablet by mouth every morning.  Marland Kitchen QUEtiapine (SEROQUEL) 100 MG tablet Take 1 tablet by mouth at bedtime.  . sennosides-docusate sodium (SENOKOT-S) 8.6-50 MG tablet Take 2 tablets by mouth  every other day.  . [DISCONTINUED] QUEtiapine (SEROQUEL) 50 MG tablet Take 150 mg by mouth at bedtime.  . [DISCONTINUED] RABEprazole (ACIPHEX) 20 MG tablet Take 1 tablet (20 mg total) by mouth daily.   No facility-administered encounter medications on file as of 07/09/2018.     Review of Systems  Constitutional: Positive for unexpected weight change. Negative for activity change, appetite change and fever.       Weight down 10 lbs in a month with the movements  HENT: Negative for congestion and trouble swallowing.   Eyes: Negative for visual disturbance.  Respiratory: Negative for chest tightness and shortness of breath.   Cardiovascular: Negative for chest pain and leg swelling.  Gastrointestinal: Positive for constipation. Negative for abdominal pain.  Genitourinary: Negative for dysuria.  Musculoskeletal: Positive for back pain and gait problem.       Falls  Skin: Negative for color change.  Neurological: Negative for dizziness and weakness.  Psychiatric/Behavioral: Positive for behavioral problems, confusion and hallucinations. Negative for agitation and sleep disturbance. The patient is not nervous/anxious.     Immunization History  Administered Date(s) Administered  . Influenza Whole 11/15/2001, 11/08/2009  . Influenza, High Dose Seasonal PF 12/22/2015, 11/24/2016  . Influenza,inj,Quad PF,6+ Mos  03/11/2014, 11/23/2014, 11/27/2017  . Influenza-Unspecified 10/07/2012  . Pneumococcal Conjugate-13 11/23/2014  . Pneumococcal Polysaccharide-23 11/24/2015  . Td 02/09/1992, 07/20/2008, 05/20/2018  . Tdap 05/20/2018  . Zoster 11/14/2010  . Zoster Recombinat (Shingrix) 11/12/2017, 02/18/2018   Pertinent  Health Maintenance Due  Topic Date Due  . INFLUENZA VACCINE  09/07/2018  . COLONOSCOPY  03/21/2020  . DEXA SCAN  Completed  . PNA vac Low Risk Adult  Completed  . MAMMOGRAM  Discontinued   Fall Risk  12/05/2017 11/28/2016 07/12/2016 03/15/2016 11/24/2015  Falls in the past year? No Yes Yes No Yes  Number falls in past yr: - 2 or more 2 or more - 1  Injury with Fall? - No No - Yes  Risk for fall due to : - - - - -   Functional Status Survey:    Vitals:   07/09/18 1140  BP: 115/72  Pulse: 84  Resp: 16  Temp: (!) 97 F (36.1 C)  TempSrc: Oral  SpO2: 98%  Weight: 128 lb (58.1 kg)  Height: 5\' 6"  (1.676 m)   Body mass index is 20.66 kg/m. Physical Exam Vitals signs reviewed.  Constitutional:      Appearance: Normal appearance.  HENT:     Head: Normocephalic and atraumatic.  Cardiovascular:     Rate and Rhythm: Normal rate and regular rhythm.  Pulmonary:     Effort: Pulmonary effort is normal.     Breath sounds: Normal breath sounds.  Abdominal:     General: Bowel sounds are normal.     Palpations: Abdomen is soft.  Skin:    General: Skin is warm and dry.     Coloration: Skin is pale.  Neurological:     Mental Status: She is alert.     Gait: Gait abnormal.     Comments: Slight tremor of head today; otherwise seated perfectly still in a chair--was able to answer some questions for me; festinating gait  Psychiatric:     Comments: Masked facies     Labs reviewed: Recent Labs    09/03/17  NA 143  K 4.4  BUN 25*  CREATININE 0.7   Recent Labs    09/03/17 04/08/18  AST 21 12*  ALT 7 14  ALKPHOS 86 57  Recent Labs    09/03/17 04/08/18  WBC 4.8 6.5  HGB  13.6 14.1  HCT 40 42  PLT 264 310   Lab Results  Component Value Date   TSH 1.43 10/17/2012   Lab Results  Component Value Date   HGBA1C 6.0 02/26/2012   Lab Results  Component Value Date   CHOL 193 07/04/2016   HDL 43 07/04/2016   LDLCALC 116 07/04/2016   LDLDIRECT 135.1 02/26/2012   TRIG 171 (A) 07/04/2016   CHOLHDL 5 02/26/2012    Assessment/Plan 1. Choreiform movements -so far today, these appear improved with sinemet cut back to 1 tablet qid--she has some nausea which I attribute to this med change (withdrawal from sinemet can do this) -hopefully, the movement remain at bay on the new dose, but she does not develop too severe of tremor or freezing at the other extreme  2. PARKINSON'S DISEASE -continues to ambulate but more festination, less safety awareness, more falls and recently #1 was making her very unsafe -cont regimen as noted with reduced sinemet and monitor movements  3. Lewy body dementia without behavioral disturbance ([place]) -due to this diagnosis, very limited in options to help with hallucinations--remains on seroquel and higher doses were not helpful with her delusions and hallucinations  4. Weight loss, unintentional -seems to be attributed to challenges eating with #1 -diet downgraded which should help; was already gradually trending down with her continued activity and decreased intake  5. Chronic midline low back pain without sciatica -has been longstanding and she still reports this as "constant"--on scheduled tylenol   6. 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 Smokey Melott, D.O. [employer] [address], [place] [address] Cell Phone (Mon-Fri 8am-5pm):   [phone] On Call:  [phone] & follow prompts after 5pm & weekends Office Phone:  [phone] Office Fax:  [phone]

## 2018-08-15 ENCOUNTER — Non-Acute Institutional Stay (SKILLED_NURSING_FACILITY): Payer: Medicare Other | Admitting: Adult Health

## 2018-08-15 ENCOUNTER — Encounter: Payer: Self-pay | Admitting: Adult Health

## 2018-08-15 DIAGNOSIS — F323 Major depressive disorder, single episode, severe with psychotic features: Secondary | ICD-10-CM

## 2018-08-15 DIAGNOSIS — F5101 Primary insomnia: Secondary | ICD-10-CM

## 2018-08-15 DIAGNOSIS — K581 Irritable bowel syndrome with constipation: Secondary | ICD-10-CM

## 2018-08-15 DIAGNOSIS — G3183 Dementia with Lewy bodies: Secondary | ICD-10-CM | POA: Diagnosis not present

## 2018-08-15 DIAGNOSIS — R634 Abnormal weight loss: Secondary | ICD-10-CM

## 2018-08-15 DIAGNOSIS — F0280 Dementia in other diseases classified elsewhere without behavioral disturbance: Secondary | ICD-10-CM

## 2018-08-15 DIAGNOSIS — G20 Parkinson's disease: Secondary | ICD-10-CM

## 2018-08-15 NOTE — Progress Notes (Signed)
Location:  Occupational psychologist of Service:  SNF (31) Provider:   Cindi Carbon, ANP [place] [phone]   Gayland Curry, DO  Patient Care Team: Gayland Curry, DO as PCP - General (Geriatric Medicine) Gaynelle Arabian, MD as Consulting Physician (Orthopedic Surgery) Ricki Rodriguez, MD as Attending Physician (Psychiatry) Martinique, Amy, MD as Consulting Physician (Dermatology) Rana Snare, MD as Consulting Physician (Urology) Nyoka Cowden, Stephannie Li, PA-C as Consulting Physician (Neurology) Duffy, Creola Corn, LCSW as Social Worker (Licensed Clinical Social Worker) Conan Bowens, RN as Registered Nurse (Hospice and Palliative Medicine)  Extended Emergency Contact Information Primary Emergency Contact: Arta Bruce Address: [address]          [place], [place] [address] Johnnette Litter of [place] Phone: [phone] Work Phone: [phone] Mobile Phone: [phone] Relation: Spouse Secondary Emergency Contact: Deatra James, [place] of [employer] Phone: [phone] Relation: Son  Code Status:  DNR Goals of care: Advanced Directive information Advanced Directives 04/30/2018  Does Patient Have a Medical Advance Directive? Yes  Type of Paramedic of [place];Living will;Out of facility DNR (pink MOST or yellow form)  Does patient want to make changes to medical advance directive? No - Patient declined  Copy of [place] in Chart? Yes - validated most recent copy scanned in chart (See row information)  Would patient like information on creating a medical advance directive? -  Pre-existing out of facility DNR order (yellow form or pink MOST form) Yellow form placed in chart (order not valid for inpatient use);Pink MOST form placed in chart (order not valid for inpatient use)     Chief Complaint  Patient presents with   Medical Management of Chronic Issues     HPI:  Pt is a 73 y.o. female seen today for medical management of chronic diseases.   She has a hx of Lewy body dementia and PD.  She was having severe choreiform movements and her sinemet was decreased by neurology in May/June.  She has seen significant improvement in these movements. She had issues with feeding due to the movements and this has improved slightly. She tends to walk around and needs finger foods for nutrition. She has gained 1.2 lbs after a periods of significant weight loss (15 lbs since January). She continues with periods of delusions and difficulty following commands but she can be redirected. She remains ambulatory and needs 1:1 care and has sitters for this reason.  There are no reports of difficulty sleeping.   Past Medical History:  Diagnosis Date   Abdominal pain, epigastric    Abnormality of gait    Anxiety state, unspecified    Arthritis    Bladder cancer ([place]) 2011   DR. GRAPEY   Cervicalgia    Detrusor instability    Esophageal reflux    Flatulence, eructation, and gas pain    Hiatal hernia    History of recurrent UTIs    Irritable bowel syndrome    Lewy body dementia (HCC)    Lumbago    Nonspecific elevation of levels of transaminase or lactic acid dehydrogenase (LDH)    Other abnormal glucose    Other and unspecified hyperlipidemia    Other and unspecified hyperlipidemia    Other chest pain    Other malaise and fatigue    Pain in joint, site unspecified    Palpitations    Parkinson's disease  Dr Tonye Royalty, [facility]   Personal history of other disorders of nervous system and sense organs    PONV (postoperative nausea and vomiting)    Restless leg syndrome    Vitamin D deficiency    Past Surgical History:  Procedure Laterality Date   APPENDECTOMY     BLADDER TUMOR EXCISION     BREAST SURGERY     Biopsy-benign   BUNIONECTOMY     left   CESAREAN SECTION     CHEMOTHERAPY     FLUSH-CANCER EXCISED CYSTOSCOPICALLY  FOLLOWED BY INSTALLATION OF CHEMOTHERAPY.   COLONOSCOPY  2011   neg; Dr Sharlett Iles   LUMBAR LAMINECTOMY     X 3; last @ age34   TOTAL HIP ARTHROPLASTY Left 04/12/2012   Procedure: TOTAL HIP ARTHROPLASTY ANTERIOR APPROACH;  Surgeon: Gearlean Alf, MD;  Location: [facility];  Service: Orthopedics;  Laterality: Left;   TRIGGER FINGER RELEASE     X2   UPPER GI ENDOSCOPY     hiatal hernia    Allergies  Allergen Reactions   Omeprazole     REACTION: HIVES Because of a history of documented adverse serious drug reaction;Medi Alert bracelet  is recommended   Simvastatin     REACTION: LEG CRAMPS   Venlafaxine     REACTION: nausea    Outpatient Encounter Medications as of 08/15/2018  Medication Sig   acetaminophen (TYLENOL) 325 MG tablet Take 650 mg by mouth 3 (three) times daily.    carbidopa-levodopa (SINEMET IR) 25-100 MG per tablet Take 1 tablet by mouth 4 (four) times daily.   divalproex (DEPAKOTE SPRINKLE) 125 MG capsule Take 250 mg by mouth 2 (two) times daily. 250 mg each morning and evening and 125 mg at lunch   donepezil (ARICEPT) 10 MG tablet Take 10 mg at bedtime by mouth.    lactose free nutrition (BOOST) LIQD Take 237 mLs by mouth daily.   Melatonin 3 MG TABS Take 6 mg by mouth at bedtime.   PARoxetine (PAXIL) 40 MG tablet Take 1 tablet by mouth every morning.   QUEtiapine (SEROQUEL) 100 MG tablet Take 1 tablet by mouth at bedtime.   sennosides-docusate sodium (SENOKOT-S) 8.6-50 MG tablet Take 2 tablets by mouth every other day.   [DISCONTINUED] RABEprazole (ACIPHEX) 20 MG tablet Take 1 tablet (20 mg total) by mouth daily.   No facility-administered encounter medications on file as of 08/15/2018.     Review of Systems  Unable to perform ROS: Dementia    Immunization History  Administered Date(s) Administered   Influenza Whole 11/15/2001, 11/08/2009   Influenza, High Dose Seasonal PF 12/22/2015, 11/24/2016   Influenza,inj,Quad PF,6+ Mos 03/11/2014,  11/23/2014, 11/27/2017   Influenza-Unspecified 10/07/2012   Pneumococcal Conjugate-13 11/23/2014   Pneumococcal Polysaccharide-23 11/24/2015   Td 02/09/1992, 07/20/2008, 05/20/2018   Tdap 05/20/2018   Zoster 11/14/2010   Zoster Recombinat (Shingrix) 11/12/2017, 02/18/2018   Pertinent  Health Maintenance Due  Topic Date Due   INFLUENZA VACCINE  09/07/2018   COLONOSCOPY  03/21/2020   DEXA SCAN  Completed   PNA vac Low Risk Adult  Completed   MAMMOGRAM  Discontinued   Fall Risk  12/05/2017 11/28/2016 07/12/2016 03/15/2016 11/24/2015  Falls in the past year? No Yes Yes No Yes  Number falls in past yr: - 2 or more 2 or more - 1  Injury with Fall? - No No - Yes  Risk for fall due to : - - - - -   Functional Status Survey:    Vitals:  08/15/18 1559  Weight: 132 lb 3.2 oz (60 kg)   Body mass index is 21.34 kg/m. Physical Exam Vitals signs and nursing note reviewed.  Constitutional:      General: She is not in acute distress.    Appearance: She is not diaphoretic.  HENT:     Head: Normocephalic and atraumatic.     Mouth/Throat:     Mouth: Mucous membranes are moist.  Eyes:     Conjunctiva/sclera: Conjunctivae normal.     Pupils: Pupils are equal, round, and reactive to light.  Neck:     Vascular: No JVD.  Cardiovascular:     Rate and Rhythm: Normal rate and regular rhythm.     Heart sounds: No murmur.  Pulmonary:     Effort: Pulmonary effort is normal. No respiratory distress.     Breath sounds: Normal breath sounds. No wheezing.  Abdominal:     General: Bowel sounds are normal. There is no distension.     Palpations: Abdomen is soft.  Musculoskeletal:     Right lower leg: No edema.     Left lower leg: No edema.  Skin:    General: Skin is warm and dry.  Neurological:     General: No focal deficit present.     Mental Status: She is alert. Mental status is at baseline.  Psychiatric:        Mood and Affect: Mood normal.     Labs reviewed: Recent Labs      09/03/17 06/17/18  NA 143 143  K 4.4 4.2  BUN 25* 21  CREATININE 0.7 0.8   Recent Labs    09/03/17 04/08/18  AST 21 12*  ALT 7 14  ALKPHOS 86 57   Recent Labs    09/03/17 04/08/18  WBC 4.8 6.5  HGB 13.6 14.1  HCT 40 42  PLT 264 310   Lab Results  Component Value Date   TSH 1.79 06/17/2018   Lab Results  Component Value Date   HGBA1C 6.0 02/26/2012   Lab Results  Component Value Date   CHOL 193 07/04/2016   HDL 43 07/04/2016   LDLCALC 116 07/04/2016   LDLDIRECT 135.1 02/26/2012   TRIG 171 (A) 07/04/2016   CHOLHDL 5 02/26/2012    Significant Diagnostic Results in last 30 days:  No results found.  Assessment/Plan 1. PARKINSON'S DISEASE Significant improvement has been noted with reduction in the dose of sinemet with choreiform movements. Continue current dose of sinemet 1 tab qid.   2. Lewy body dementia without behavioral disturbance ([facility]) Severe but remains ambulatory and intermittently able to f/c.  She has lost weight and we could consider discontinuing the aricept but much of her weight loss may have been attribute to eating challenges and calorie burn associated with her excessive movements. Will continue to monitor.   3. Irritable bowel syndrome with constipation No bowel issues reported. Continue senna s two tabs qod   4. Severe single current episode of major depressive disorder, with psychotic features ([place]) Less periods of crying noted recently. Continues to have delusions. Continue seroquel 100 mg qd.   5. Weight loss Has gained 1.2 lbs in the past month, see above  6. Primary insomnia Continue melatonin 3 mg qhs   MMA ordered as B12 was borderline low which was originally ordered due to her dementia progression and excessive movement which have since improved.    Family/ staff Communication: resident and staff   Labs/tests ordered:  MMA

## 2018-08-16 DIAGNOSIS — D510 Vitamin B12 deficiency anemia due to intrinsic factor deficiency: Secondary | ICD-10-CM | POA: Diagnosis not present

## 2018-09-09 ENCOUNTER — Non-Acute Institutional Stay (SKILLED_NURSING_FACILITY): Payer: Medicare Other | Admitting: Adult Health

## 2018-09-09 ENCOUNTER — Encounter: Payer: Self-pay | Admitting: Adult Health

## 2018-09-09 DIAGNOSIS — G20 Parkinson's disease: Secondary | ICD-10-CM | POA: Diagnosis not present

## 2018-09-09 DIAGNOSIS — F5101 Primary insomnia: Secondary | ICD-10-CM | POA: Diagnosis not present

## 2018-09-09 DIAGNOSIS — F0280 Dementia in other diseases classified elsewhere without behavioral disturbance: Secondary | ICD-10-CM

## 2018-09-09 DIAGNOSIS — C679 Malignant neoplasm of bladder, unspecified: Secondary | ICD-10-CM

## 2018-09-09 DIAGNOSIS — M1611 Unilateral primary osteoarthritis, right hip: Secondary | ICD-10-CM

## 2018-09-09 DIAGNOSIS — G3183 Dementia with Lewy bodies: Secondary | ICD-10-CM

## 2018-09-09 DIAGNOSIS — F323 Major depressive disorder, single episode, severe with psychotic features: Secondary | ICD-10-CM

## 2018-09-09 NOTE — Progress Notes (Signed)
Location:  Occupational psychologist of Service:  SNF (31) Provider:   Cindi Carbon, ANP [place] [phone]   Gayland Curry, DO  Patient Care Team: Gayland Curry, DO as PCP - General (Geriatric Medicine) Gaynelle Arabian, MD as Consulting Physician (Orthopedic Surgery) Ricki Rodriguez, MD as Attending Physician (Psychiatry) Martinique, Amy, MD as Consulting Physician (Dermatology) Rana Snare, MD as Consulting Physician (Urology) Nyoka Cowden, Stephannie Li, PA-C as Consulting Physician (Neurology) Duffy, Creola Corn, LCSW as Social Worker (Licensed Clinical Social Worker) Conan Bowens, RN as Registered Nurse (Hospice and Palliative Medicine)  Extended Emergency Contact Information Primary Emergency Contact: Arta Bruce Address: [address]          [place], [place] [address] Johnnette Litter of [place] Phone: [phone] Work Phone: [phone] Mobile Phone: [phone] Relation: Spouse Secondary Emergency Contact: Deatra James, [place] of [employer] Phone: [phone] Relation: Son  Code Status:  DNR Goals of care: Advanced Directive information Advanced Directives 04/30/2018  Does Patient Have a Medical Advance Directive? Yes  Type of Paramedic of [place];Living will;Out of facility DNR (pink MOST or yellow form)  Does patient want to make changes to medical advance directive? No - Patient declined  Copy of [place] in Chart? Yes - validated most recent copy scanned in chart (See row information)  Would patient like information on creating a medical advance directive? -  Pre-existing out of facility DNR order (yellow form or pink MOST form) Yellow form placed in chart (order not valid for inpatient use);Pink MOST form placed in chart (order not valid for inpatient use)     Chief Complaint  Patient presents with  . Medical Management of Chronic Issues     HPI:  Pt is a 73 y.o. female seen today for medical management of chronic diseases.    LBD: she has periods of tearfulness and delusions but they are less often. She has no issues with aggressive behavior. No fever or rigidity.   PD: doing well on sinemet, nurse reports small increase in choreiform movements.   No reports of joint pain or swelling.   Weight is up 3 lbs from June and she is drinking boost   Functional status: ambulatory, intermittent incontinent Past Medical History:  Diagnosis Date  . Abdominal pain, epigastric   . Abnormality of gait   . Anxiety state, unspecified   . Arthritis   . Bladder cancer ([place]) 2011   DR. GRAPEY  . Cervicalgia   . Detrusor instability   . Esophageal reflux   . Flatulence, eructation, and gas pain   . Hiatal hernia   . History of recurrent UTIs   . Irritable bowel syndrome   . Lewy body dementia ([place])   . Lumbago   . Nonspecific elevation of levels of transaminase or lactic acid dehydrogenase (LDH)   . Other abnormal glucose   . Other and unspecified hyperlipidemia   . Other and unspecified hyperlipidemia   . Other chest pain   . Other malaise and fatigue   . Pain in joint, site unspecified   . Palpitations   . Parkinson's disease    Dr Tonye Royalty, [facility]  . Personal history of other disorders of nervous system and sense organs   . PONV (postoperative nausea and vomiting)   . Restless leg syndrome   . Vitamin D deficiency    Past Surgical History:  Procedure  Laterality Date  . APPENDECTOMY    . BLADDER TUMOR EXCISION    . BREAST SURGERY     Biopsy-benign  . BUNIONECTOMY     left  . CESAREAN SECTION    . CHEMOTHERAPY     FLUSH-CANCER EXCISED CYSTOSCOPICALLY FOLLOWED BY INSTALLATION OF CHEMOTHERAPY.  . COLONOSCOPY  2011   neg; Dr Sharlett Iles  . LUMBAR LAMINECTOMY     X 3; last @ age73  . TOTAL HIP ARTHROPLASTY Left 04/12/2012   Procedure: TOTAL HIP ARTHROPLASTY ANTERIOR APPROACH;  Surgeon: Gearlean Alf, MD;  Location: [facility];  Service: Orthopedics;  Laterality: Left;  . TRIGGER FINGER RELEASE     X2  . UPPER GI ENDOSCOPY     hiatal hernia    Allergies  Allergen Reactions  . Omeprazole     REACTION: HIVES Because of a history of documented adverse serious drug reaction;Medi Alert bracelet  is recommended  . Simvastatin     REACTION: LEG CRAMPS  . Venlafaxine     REACTION: nausea    Outpatient Encounter Medications as of 09/09/2018  Medication Sig  . acetaminophen (TYLENOL) 325 MG tablet Take 650 mg by mouth 3 (three) times daily.   . carbidopa-levodopa (SINEMET IR) 25-100 MG per tablet Take 1 tablet by mouth 4 (four) times daily.  . divalproex (DEPAKOTE SPRINKLE) 125 MG capsule Take 250 mg by mouth 2 (two) times daily. 250 mg each morning and evening and 125 mg at lunch  . donepezil (ARICEPT) 10 MG tablet Take 10 mg at bedtime by mouth.   . lactose free nutrition (BOOST) LIQD Take 237 mLs by mouth daily.  . Melatonin 3 MG TABS Take 6 mg by mouth at bedtime.  Marland Kitchen PARoxetine (PAXIL) 40 MG tablet Take 1 tablet by mouth every morning.  Marland Kitchen QUEtiapine (SEROQUEL) 100 MG tablet Take 1 tablet by mouth at bedtime.  . sennosides-docusate sodium (SENOKOT-S) 8.6-50 MG tablet Take 2 tablets by mouth every other day.  . [DISCONTINUED] RABEprazole (ACIPHEX) 20 MG tablet Take 1 tablet (20 mg total) by mouth daily.   No facility-administered encounter medications on file as of 09/09/2018.     Review of Systems  Unable to perform ROS: Dementia    Immunization History  Administered Date(s) Administered  . Influenza Whole 11/15/2001, 11/08/2009  . Influenza, High Dose Seasonal PF 12/22/2015, 11/24/2016  . Influenza,inj,Quad PF,6+ Mos 03/11/2014, 11/23/2014, 11/27/2017  . Influenza-Unspecified 10/07/2012  . Pneumococcal Conjugate-13 11/23/2014  . Pneumococcal Polysaccharide-23 11/24/2015  . Td 02/09/1992, 07/20/2008, 05/20/2018  . Tdap 05/20/2018  . Zoster 11/14/2010  . Zoster Recombinat (Shingrix) 11/12/2017,  02/18/2018   Pertinent  Health Maintenance Due  Topic Date Due  . INFLUENZA VACCINE  09/07/2018  . COLONOSCOPY  03/21/2020  . DEXA SCAN  Completed  . PNA vac Low Risk Adult  Completed  . MAMMOGRAM  Discontinued   Fall Risk  12/05/2017 11/28/2016 07/12/2016 03/15/2016 11/24/2015  Falls in the past year? No Yes Yes No Yes  Number falls in past yr: - 2 or more 2 or more - 1  Injury with Fall? - No No - Yes  Risk for fall due to : - - - - -   Functional Status Survey:    Vitals:   09/09/18 1458  BP: 113/77  Pulse: 76  Resp: 18  Temp: (!) 97.2 F (36.2 C)  SpO2: 96%  Weight: 131 lb 12.8 oz (59.8 kg)   Body mass index is 21.27 kg/m.  Wt Readings from Last 3  Encounters:  09/09/18 131 lb 12.8 oz (59.8 kg)  08/15/18 132 lb 3.2 oz (60 kg)  07/09/18 128 lb (58.1 kg)   Physical Exam Vitals signs and nursing note reviewed.  Constitutional:      General: She is not in acute distress.    Appearance: She is not diaphoretic.  HENT:     Head: Normocephalic and atraumatic.  Neck:     Vascular: No JVD.  Cardiovascular:     Rate and Rhythm: Normal rate and regular rhythm.     Heart sounds: No murmur.  Pulmonary:     Effort: Pulmonary effort is normal. No respiratory distress.     Breath sounds: Normal breath sounds. No wheezing.  Abdominal:     General: Bowel sounds are normal. There is no distension.     Palpations: Abdomen is soft.     Tenderness: There is no abdominal tenderness.  Musculoskeletal:     Right lower leg: No edema.     Left lower leg: No edema.  Lymphadenopathy:     Cervical: No cervical adenopathy.  Skin:    General: Skin is warm and dry.  Neurological:     General: No focal deficit present.     Mental Status: She is alert. Mental status is at baseline.     Motor: No weakness.     Gait: Gait abnormal.     Comments: No tremor noted. Choreiform like movements of arms noted   Psychiatric:        Mood and Affect: Mood normal.     Labs reviewed: Recent  Labs    06/17/18  NA 143  K 4.2  BUN 21  CREATININE 0.8   Recent Labs    04/08/18  AST 12*  ALT 14  ALKPHOS 57   Recent Labs    04/08/18  WBC 6.5  HGB 14.1  HCT 42  PLT 310   Lab Results  Component Value Date   TSH 1.79 06/17/2018   Lab Results  Component Value Date   HGBA1C 6.0 02/26/2012   Lab Results  Component Value Date   CHOL 193 07/04/2016   HDL 43 07/04/2016   LDLCALC 116 07/04/2016   LDLDIRECT 135.1 02/26/2012   TRIG 171 (A) 07/04/2016   CHOLHDL 5 02/26/2012    Significant Diagnostic Results in last 30 days:  No results found.  Assessment/Plan 1. Lewy body dementia without behavioral disturbance ([place]) .Progressive decline in cognition and physical function c/w the disease. Continue supportive care in the memory care environment.  2. PARKINSON'S DISEASE Less choreiform movements over all but still present.  Followed by neurology on sinemet  3. Arthritis of right hip Controlled with tylenol tid  4. Malignant neoplasm of urinary bladder, unspecified site [facility]) No recurrent of symptoms Goals of care are comfort based   5. Primary insomnia Continue melatonin 6 mg qhs  6. Severe single current episode of major depressive disorder, with psychotic features ([place]) She continues to have occasional periods of delusions and tearfulness but overall she is improved in this matter over the past year Continue Paxil 40 mg qd and seroquel 100 mg qd     Family/ staff Communication: staff   Labs/tests ordered:  NA

## 2018-10-08 ENCOUNTER — Encounter: Payer: Self-pay | Admitting: Internal Medicine

## 2018-10-08 ENCOUNTER — Non-Acute Institutional Stay (SKILLED_NURSING_FACILITY): Payer: Medicare Other | Admitting: Internal Medicine

## 2018-10-08 DIAGNOSIS — G20 Parkinson's disease: Secondary | ICD-10-CM | POA: Diagnosis not present

## 2018-10-08 DIAGNOSIS — R634 Abnormal weight loss: Secondary | ICD-10-CM

## 2018-10-08 DIAGNOSIS — F0281 Dementia in other diseases classified elsewhere with behavioral disturbance: Secondary | ICD-10-CM

## 2018-10-08 DIAGNOSIS — G8929 Other chronic pain: Secondary | ICD-10-CM

## 2018-10-08 DIAGNOSIS — R258 Other abnormal involuntary movements: Secondary | ICD-10-CM | POA: Diagnosis not present

## 2018-10-08 DIAGNOSIS — M545 Low back pain, unspecified: Secondary | ICD-10-CM

## 2018-10-08 DIAGNOSIS — G20A1 Parkinson's disease without dyskinesia, without mention of fluctuations: Secondary | ICD-10-CM

## 2018-10-08 DIAGNOSIS — K581 Irritable bowel syndrome with constipation: Secondary | ICD-10-CM | POA: Diagnosis not present

## 2018-10-08 DIAGNOSIS — G3183 Dementia with Lewy bodies: Secondary | ICD-10-CM | POA: Diagnosis not present

## 2018-10-08 NOTE — Progress Notes (Signed)
Patient ID: Kristina Carr, female   DOB: [birth year], 73 y.o.   MRN: [id number]  Location:  [facility] Room Number: [id number] memory care Place of Service:  SNF [phone]) Provider:   Gayland Curry, DO  Patient Care Team: Gayland Curry, DO as PCP - General (Geriatric Medicine) Gaynelle Arabian, MD as Consulting Physician (Orthopedic Surgery) Ricki Rodriguez, MD as Attending Physician (Psychiatry) Martinique, Amy, MD as Consulting Physician (Dermatology) Rana Snare, MD as Consulting Physician (Urology) Nyoka Cowden, Stephannie Li, PA-C as Consulting Physician (Neurology) Duffy, Creola Corn, LCSW as Social Worker (Licensed Clinical Social Worker) Conan Bowens, RN as Registered Nurse [facility] and Palliative [facility])  Extended Emergency Contact Information Primary Emergency Contact: Arta Bruce Address: [address]          [place], [place] [address] Johnnette Litter of [place] Phone: [phone] Work Phone: [phone] Mobile Phone: [phone] Relation: Spouse Secondary Emergency Contact: Deatra James, [place] of [place] Phone: [phone] Relation: Son  Code Status:  DNR, MOST Goals of care: Advanced Directive information Advanced Directives 04/30/2018  Does Patient Have a Medical Advance Directive? Yes  Type of Paramedic of [place];Living will;Out of facility DNR (pink MOST or yellow form)  Does patient want to make changes to medical advance directive? No - Patient declined  Copy of [place] in Chart? Yes - validated most recent copy scanned in chart (See row information)  Would patient like information on creating a medical advance directive? -  Pre-existing out of facility DNR order (yellow form or pink MOST form) Yellow form placed in chart (order not valid for inpatient use);Pink MOST form placed in chart (order not valid for inpatient use)     Chief  Complaint  Patient presents with  . Medical Management of Chronic Issues    Routine Visit    HPI:  Pt is a 73 y.o. female seen today for medical management of chronic diseases.  Kristina Carr lives in memory care for long-term care due to parkinson's disease and lewy body dementia.  She's also had a history of bladder ca, chronic constipation, osteoporosis and depression.  Kristina Carr has been doing much better since her medications were adjusted (sinemet reduced due to choreiform movements and increased falls).  She does not seem to be struggling with increased tremors or freezing to speak of.  She does still fall occasionally.  Staff help her transfer by taking her hands.  There were no reports of agitation or combativeness either.    Nursing notes only discuss a fall she had a few days ago.  No injury.  No mention of behavioral concerns.  She has been on long-term seroquel for her psychosis from lewy body disease.  She's been on paxil for depression and depakote to stabilize her mood.  She takes aricept for dementia.    She is getting boost daily and a diet with chopped meats fed with a spoon.    She takes melatonin to help her rest at night.   Bowels are moving ok with senokot-s 2 tabs qod.    She takes regular tylenol for her back pain which has been longstanding, as well.    Past Medical History:  Diagnosis Date  . Abdominal pain, epigastric   . Abnormality of gait   . Anxiety state, unspecified   . Arthritis   . Bladder cancer ([place]) 2011   DR. GRAPEY  . Cervicalgia   .  Detrusor instability   . Esophageal reflux   . Flatulence, eructation, and gas pain   . Hiatal hernia   . History of recurrent UTIs   . Irritable bowel syndrome   . Lewy body dementia ([place])   . Lumbago   . Nonspecific elevation of levels of transaminase or lactic acid dehydrogenase (LDH)   . Other abnormal glucose   . Other and unspecified hyperlipidemia   . Other and unspecified hyperlipidemia   . Other chest pain     . Other malaise and fatigue   . Pain in joint, site unspecified   . Palpitations   . Parkinson's disease    Dr Tonye Royalty, [facility]  . Personal history of other disorders of nervous system and sense organs   . PONV (postoperative nausea and vomiting)   . Restless leg syndrome   . Vitamin D deficiency    Past Surgical History:  Procedure Laterality Date  . APPENDECTOMY    . BLADDER TUMOR EXCISION    . BREAST SURGERY     Biopsy-benign  . BUNIONECTOMY     left  . CESAREAN SECTION    . CHEMOTHERAPY     FLUSH-CANCER EXCISED CYSTOSCOPICALLY FOLLOWED BY INSTALLATION OF CHEMOTHERAPY.  . COLONOSCOPY  2011   neg; Dr Sharlett Iles  . LUMBAR LAMINECTOMY     X 3; last @ age34  . TOTAL HIP ARTHROPLASTY Left 04/12/2012   Procedure: TOTAL HIP ARTHROPLASTY ANTERIOR APPROACH;  Surgeon: Gearlean Alf, MD;  Location: [facility];  Service: Orthopedics;  Laterality: Left;  . TRIGGER FINGER RELEASE     X2  . UPPER GI ENDOSCOPY     hiatal hernia    Allergies  Allergen Reactions  . Omeprazole     REACTION: HIVES Because of a history of documented adverse serious drug reaction;Medi Alert bracelet  is recommended  . Simvastatin     REACTION: LEG CRAMPS  . Venlafaxine     REACTION: nausea    Outpatient Encounter Medications as of 10/08/2018  Medication Sig  . acetaminophen (TYLENOL) 325 MG tablet Take 650 mg by mouth 3 (three) times daily.   . carbidopa-levodopa (SINEMET IR) 25-100 MG per tablet Take 1 tablet by mouth 4 (four) times daily.  . divalproex (DEPAKOTE SPRINKLE) 125 MG capsule Take 250 mg by mouth 2 (two) times daily. Then take 1 tablet by mouth daily at lunch  . donepezil (ARICEPT) 10 MG tablet Take 10 mg at bedtime by mouth.   . lactose free nutrition (BOOST) LIQD Take 237 mLs by mouth daily.  . Melatonin 3 MG TABS Take 6 mg by mouth at bedtime.  Marland Kitchen PARoxetine (PAXIL) 40 MG tablet Take 1 tablet by mouth every morning.  Marland Kitchen QUEtiapine (SEROQUEL) 100 MG tablet Take 1 tablet by mouth at bedtime.  .  sennosides-docusate sodium (SENOKOT-S) 8.6-50 MG tablet Take 2 tablets by mouth every other day.  . [DISCONTINUED] RABEprazole (ACIPHEX) 20 MG tablet Take 1 tablet (20 mg total) by mouth daily.   No facility-administered encounter medications on file as of 10/08/2018.     Review of Systems  Constitutional: Negative for activity change, appetite change, chills and fever.       She has gained 5 lbs (had been struggling even with eating when movements were so bad)  HENT: Negative for congestion, hearing loss and trouble swallowing.   Eyes: Negative for visual disturbance.  Respiratory: Negative for chest tightness and shortness of breath.   Cardiovascular: Negative for chest pain, palpitations and leg swelling.  Gastrointestinal: Positive for constipation. Negative for abdominal distention, abdominal pain, diarrhea, nausea and vomiting.  Genitourinary: Negative for dysuria.  Musculoskeletal: Positive for back pain and gait problem. Negative for arthralgias.  Skin: Negative for color change.  Neurological: Negative for dizziness and weakness.  Psychiatric/Behavioral: Positive for confusion and hallucinations. Negative for agitation, behavioral problems, self-injury and sleep disturbance. The patient is not nervous/anxious.     Immunization History  Administered Date(s) Administered  . Influenza Whole 11/15/2001, 11/08/2009  . Influenza, High Dose Seasonal PF 12/22/2015, 11/24/2016  . Influenza,inj,Quad PF,6+ Mos 03/11/2014, 11/23/2014, 11/27/2017  . Influenza-Unspecified 10/07/2012  . Pneumococcal Conjugate-13 11/23/2014  . Pneumococcal Polysaccharide-23 11/24/2015  . Td 02/09/1992, 07/20/2008, 05/20/2018  . Tdap 05/20/2018  . Zoster 11/14/2010  . Zoster Recombinat (Shingrix) 11/12/2017, 02/18/2018   Pertinent  Health Maintenance Due  Topic Date Due  . INFLUENZA VACCINE  09/07/2018  . COLONOSCOPY  03/21/2020  . DEXA SCAN  Completed  . PNA vac Low Risk Adult  Completed  . MAMMOGRAM   Discontinued   Fall Risk  12/05/2017 11/28/2016 07/12/2016 03/15/2016 11/24/2015  Falls in the past year? No Yes Yes No Yes  Number falls in past yr: - 2 or more 2 or more - 1  Injury with Fall? - No No - Yes  Risk for fall due to : - - - - -   Functional Status Survey:    Vitals:   10/08/18 1309  BP: 121/79  Pulse: 67  Resp: 18  Temp: 97.7 F (36.5 C)  TempSrc: Oral  SpO2: 96%  Weight: 136 lb (61.7 kg)  Height: 5\' 6"  (1.676 m)   Body mass index is 21.95 kg/m. Physical Exam Vitals signs reviewed.  Constitutional:      General: She is not in acute distress.    Appearance: Normal appearance. She is not ill-appearing or toxic-appearing.  HENT:     Head: Normocephalic and atraumatic.  Cardiovascular:     Rate and Rhythm: Normal rate and regular rhythm.     Pulses: Normal pulses.     Heart sounds: Normal heart sounds.  Pulmonary:     Effort: Pulmonary effort is normal.     Breath sounds: Normal breath sounds.  Abdominal:     General: Bowel sounds are normal.  Musculoskeletal: Normal range of motion.     Right lower leg: No edema.     Left lower leg: No edema.     Comments: Mild resting tremor and cogwheel rigidity, unsteady gait with high steps  Skin:    General: Skin is warm and dry.     Capillary Refill: Capillary refill takes less than 2 seconds.  Neurological:     General: No focal deficit present.     Mental Status: She is alert. Mental status is at baseline.     Cranial Nerves: No cranial nerve deficit.     Motor: Weakness present.     Gait: Gait abnormal.  Psychiatric:     Comments: Flat affect; no longer seems aware of who I am like she once was     Labs reviewed: Recent Labs    06/17/18  NA 143  K 4.2  BUN 21  CREATININE 0.8   Recent Labs    04/08/18  AST 12*  ALT 14  ALKPHOS 57   Recent Labs    04/08/18  WBC 6.5  HGB 14.1  HCT 42  PLT 310   Lab Results  Component Value Date   TSH 1.79 06/17/2018   Lab  Results  Component Value Date     HGBA1C 6.0 02/26/2012   Lab Results  Component Value Date   CHOL 193 07/04/2016   HDL 43 07/04/2016   LDLCALC 116 07/04/2016   LDLDIRECT 135.1 02/26/2012   TRIG 171 (A) 07/04/2016   CHOLHDL 5 02/26/2012    Assessment/Plan 1. Lewy body dementia with behavioral disturbance ([place]) -continue her seroquel for disturbing hallucinations; also on depakote for mood stabilization and paxil for depression related to this  2. PARKINSON'S DISEASE -doing much better now, but ongoing, cont sinemet therapy  3. Chronic midline low back pain without sciatica -continues on scheduled tylenol for this and some recent hip pain, as well  4. Irritable bowel syndrome with constipation -continue senokot-s regimen  5. Choreiform movements -resolved with decreased sinemet  6. Weight loss -weight going back up now that able to move more freely and getting fed chopped meats with spoon and getting boost  Family/ staff Communication: discussed with memory care nurse  Labs/tests ordered:  No new  Jasie Meleski L. Shauntay Brunelli, D.O. [facility] [employer] [address], [place] [address] Cell Phone (Mon-Fri 8am-5pm):  [phone] On Call:  [phone] & follow prompts after 5pm & weekends Office Phone:  [phone] Office Fax:  [phone]

## 2018-11-06 DIAGNOSIS — Z9189 Other specified personal risk factors, not elsewhere classified: Secondary | ICD-10-CM | POA: Diagnosis not present

## 2018-11-07 LAB — NOVEL CORONAVIRUS, NAA: SARS-CoV-2, NAA: NOT DETECTED

## 2018-11-13 ENCOUNTER — Encounter: Payer: Self-pay | Admitting: Gynecology

## 2018-11-14 DIAGNOSIS — Z20828 Contact with and (suspected) exposure to other viral communicable diseases: Secondary | ICD-10-CM | POA: Diagnosis not present

## 2018-11-20 ENCOUNTER — Encounter: Payer: Self-pay | Admitting: *Deleted

## 2018-11-21 ENCOUNTER — Non-Acute Institutional Stay (SKILLED_NURSING_FACILITY): Payer: Medicare Other | Admitting: Adult Health

## 2018-11-21 ENCOUNTER — Encounter: Payer: Self-pay | Admitting: Adult Health

## 2018-11-21 DIAGNOSIS — K219 Gastro-esophageal reflux disease without esophagitis: Secondary | ICD-10-CM | POA: Diagnosis not present

## 2018-11-21 DIAGNOSIS — M539 Dorsopathy, unspecified: Secondary | ICD-10-CM

## 2018-11-21 DIAGNOSIS — G20 Parkinson's disease: Secondary | ICD-10-CM

## 2018-11-21 DIAGNOSIS — G3183 Dementia with Lewy bodies: Secondary | ICD-10-CM

## 2018-11-21 DIAGNOSIS — K592 Neurogenic bowel, not elsewhere classified: Secondary | ICD-10-CM

## 2018-11-21 DIAGNOSIS — F0280 Dementia in other diseases classified elsewhere without behavioral disturbance: Secondary | ICD-10-CM

## 2018-11-21 DIAGNOSIS — K5900 Constipation, unspecified: Secondary | ICD-10-CM | POA: Diagnosis not present

## 2018-11-21 DIAGNOSIS — F323 Major depressive disorder, single episode, severe with psychotic features: Secondary | ICD-10-CM

## 2018-11-21 DIAGNOSIS — Z9189 Other specified personal risk factors, not elsewhere classified: Secondary | ICD-10-CM | POA: Diagnosis not present

## 2018-11-22 NOTE — Progress Notes (Signed)
Location:  Occupational psychologist of Service:  SNF (31) Provider:   Cindi Carbon, ANP [place] [phone]   Gayland Curry, DO  Patient Care Team: Gayland Curry, DO as PCP - General (Geriatric Medicine) Gaynelle Arabian, MD as Consulting Physician (Orthopedic Surgery) Ricki Rodriguez, MD as Attending Physician (Psychiatry) Martinique, Amy, MD as Consulting Physician (Dermatology) Rana Snare, MD as Consulting Physician (Urology) Nyoka Cowden, Stephannie Li, PA-C as Consulting Physician (Neurology) Duffy, Creola Corn, LCSW as Social Worker (Licensed Clinical Social Worker) Conan Bowens, RN as Registered Nurse (Hospice and Palliative Medicine)  Extended Emergency Contact Information Primary Emergency Contact: Arta Bruce Address: [address]          [place], [place] [address] Johnnette Litter of [place] Phone: [phone] Work Phone: [phone] Mobile Phone: [phone] Relation: Spouse Secondary Emergency Contact: Deatra James, [place] of [employer] Phone: [phone] Relation: Son  Code Status:  DNR Goals of care: Advanced Directive information Advanced Directives 04/30/2018  Does Patient Have a Medical Advance Directive? Yes  Type of Paramedic of [place];Living will;Out of facility DNR (pink MOST or yellow form)  Does patient want to make changes to medical advance directive? No - Patient declined  Copy of [place] in Chart? Yes - validated most recent copy scanned in chart (See row information)  Would patient like information on creating a medical advance directive? -  Pre-existing out of facility DNR order (yellow form or pink MOST form) Yellow form placed in chart (order not valid for inpatient use);Pink MOST form placed in chart (order not valid for inpatient use)     Chief Complaint  Patient presents with  . Medical Management of Chronic Issues     HPI:  Pt is a 73 y.o. female seen today for medical management of chronic diseases.    LBD: the nurse reports that she has periods of restlessness and needs guidance and cuing during ambulation. Possible delusions reported.  Depression: Less teafulness, weight trending upward.  Wt Readings from Last 3 Encounters:  11/22/18 135 lb 12.8 oz (61.6 kg)  10/08/18 136 lb (61.7 kg)  09/09/18 131 lb 12.8 oz (59.8 kg)   Bowels are moving well. Appetite is fair  Staff report that she periodically will report back pain. She has a hx of DDD.  No reported issus with reflux. She has a hx of gerd and a diaphragmatic hernia.  Continues with festinating gait but no issues with rigidity. Chorea like movements are reduced.    Past Medical History:  Diagnosis Date  . Abdominal pain, epigastric   . Abnormality of gait   . Anxiety state, unspecified   . Arthritis   . Bladder cancer ([place]) 2011   DR. GRAPEY  . Cervicalgia   . Detrusor instability   . Esophageal reflux   . Flatulence, eructation, and gas pain   . Hiatal hernia   . History of recurrent UTIs   . Irritable bowel syndrome   . Lewy body dementia ([place])   . Lumbago   . Nonspecific elevation of levels of transaminase or lactic acid dehydrogenase (LDH)   . Other abnormal glucose   . Other and unspecified hyperlipidemia   . Other and unspecified hyperlipidemia   . Other chest pain   . Other malaise and fatigue   . Pain in joint, site unspecified   . Palpitations   . Parkinson's disease  Dr Tonye Royalty, [facility]  . Personal history of other disorders of nervous system and sense organs   . PONV (postoperative nausea and vomiting)   . Restless leg syndrome   . Vitamin D deficiency    Past Surgical History:  Procedure Laterality Date  . APPENDECTOMY    . BLADDER TUMOR EXCISION    . BREAST SURGERY     Biopsy-benign  . BUNIONECTOMY     left  . CESAREAN SECTION    . CHEMOTHERAPY     FLUSH-CANCER EXCISED CYSTOSCOPICALLY FOLLOWED BY  INSTALLATION OF CHEMOTHERAPY.  . COLONOSCOPY  2011   neg; Dr Sharlett Iles  . LUMBAR LAMINECTOMY     X 3; last @ age34  . TOTAL HIP ARTHROPLASTY Left 04/12/2012   Procedure: TOTAL HIP ARTHROPLASTY ANTERIOR APPROACH;  Surgeon: Gearlean Alf, MD;  Location: [facility];  Service: Orthopedics;  Laterality: Left;  . TRIGGER FINGER RELEASE     X2  . UPPER GI ENDOSCOPY     hiatal hernia    Allergies  Allergen Reactions  . Omeprazole     REACTION: HIVES Because of a history of documented adverse serious drug reaction;Medi Alert bracelet  is recommended  . Simvastatin     REACTION: LEG CRAMPS  . Venlafaxine     REACTION: nausea    Outpatient Encounter Medications as of 11/21/2018  Medication Sig  . acetaminophen (TYLENOL) 325 MG tablet Take 650 mg by mouth 3 (three) times daily.   . carbidopa-levodopa (SINEMET IR) 25-100 MG per tablet Take 1 tablet by mouth 4 (four) times daily.  . divalproex (DEPAKOTE SPRINKLE) 125 MG capsule Take 250 mg by mouth 2 (two) times daily. Then take 1 tablet by mouth daily at lunch  . donepezil (ARICEPT) 10 MG tablet Take 10 mg at bedtime by mouth.   . lactose free nutrition (BOOST) LIQD Take 237 mLs by mouth daily.  . Melatonin 3 MG TABS Take 6 mg by mouth at bedtime.  Marland Kitchen PARoxetine (PAXIL) 40 MG tablet Take 1 tablet by mouth every morning.  Marland Kitchen QUEtiapine (SEROQUEL) 100 MG tablet Take 1 tablet by mouth at bedtime.  . sennosides-docusate sodium (SENOKOT-S) 8.6-50 MG tablet Take 2 tablets by mouth every other day.  . [DISCONTINUED] RABEprazole (ACIPHEX) 20 MG tablet Take 1 tablet (20 mg total) by mouth daily.   No facility-administered encounter medications on file as of 11/21/2018.     Review of Systems  Unable to perform ROS: Dementia    Immunization History  Administered Date(s) Administered  . Influenza Whole 11/15/2001, 11/08/2009  . Influenza, High Dose Seasonal PF 12/22/2015, 11/24/2016  . Influenza,inj,Quad PF,6+ Mos 03/11/2014, 11/23/2014, 11/27/2017   . Influenza-Unspecified 10/07/2012  . Pneumococcal Conjugate-13 11/23/2014  . Pneumococcal Polysaccharide-23 11/24/2015  . Td 02/09/1992, 07/20/2008, 05/20/2018  . Tdap 05/20/2018  . Zoster 11/14/2010  . Zoster Recombinat (Shingrix) 11/12/2017, 02/18/2018   Pertinent  Health Maintenance Due  Topic Date Due  . INFLUENZA VACCINE  09/07/2018  . COLONOSCOPY  03/21/2020  . DEXA SCAN  Completed  . PNA vac Low Risk Adult  Completed  . MAMMOGRAM  Discontinued   Fall Risk  12/05/2017 11/28/2016 07/12/2016 03/15/2016 11/24/2015  Falls in the past year? No Yes Yes No Yes  Number falls in past yr: - 2 or more 2 or more - 1  Injury with Fall? - No No - Yes  Risk for fall due to : - - - - -   Functional Status Survey:    Vitals:   11/22/18  0908  Weight: 135 lb 12.8 oz (61.6 kg)   Body mass index is 21.92 kg/m. Physical Exam Vitals signs and nursing note reviewed.  Constitutional:      General: She is not in acute distress.    Appearance: She is not diaphoretic.  HENT:     Head: Normocephalic and atraumatic.  Neck:     Vascular: No JVD.  Cardiovascular:     Rate and Rhythm: Normal rate and regular rhythm.     Heart sounds: No murmur.  Pulmonary:     Effort: Pulmonary effort is normal. No respiratory distress.     Breath sounds: Normal breath sounds. No wheezing.  Abdominal:     General: Bowel sounds are normal. There is no distension.     Palpations: Abdomen is soft.     Tenderness: There is no abdominal tenderness.  Skin:    General: Skin is warm and dry.  Neurological:     General: No focal deficit present.     Mental Status: She is alert. Mental status is at baseline.     Comments: No tremor or rigidity noted.  Psychiatric:        Mood and Affect: Mood normal.     Labs reviewed: Recent Labs    06/17/18  NA 143  K 4.2  BUN 21  CREATININE 0.8   Recent Labs    04/08/18  AST 12*  ALT 14  ALKPHOS 57   Recent Labs    04/08/18  WBC 6.5  HGB 14.1  HCT 42  PLT  310   Lab Results  Component Value Date   TSH 1.79 06/17/2018   Lab Results  Component Value Date   HGBA1C 6.0 02/26/2012   Lab Results  Component Value Date   CHOL 193 07/04/2016   HDL 43 07/04/2016   LDLCALC 116 07/04/2016   LDLDIRECT 135.1 02/26/2012   TRIG 171 (A) 07/04/2016   CHOLHDL 5 02/26/2012    Significant Diagnostic Results in last 30 days:  No results found.  Assessment/Plan 1. 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 Gastroesophageal reflux disease, unspecified whether esophagitis present No s/s of gerd. Continue to assess which is difficult due to her dementia  3. Lewy body dementia without behavioral disturbance ([place]) Continues with restlessness, confusion, possible delusions, etc. Overall she has improved in the past few months. Would continue her current regimen of Depakote and Seroquel.   4. PARKINSON'S DISEASE Improved chorea like movements with current dose of sinemet. Continue to monitor, followed by neurology  5. Multilevel degenerative disc disease Controlled with scheduled tylenol  6. Severe single current episode of major depressive disorder, with psychotic features ([place]) Has periods of delusions but improved mood in that she has less tearful episodes. Would continue paxil at current dose at this time. She is doing so well that compared to 6 months ago that changes in her current regimen could lead to destabilization    Family/ staff Communication: staff  Labs/tests ordered:  None today

## 2018-11-25 DIAGNOSIS — Z9189 Other specified personal risk factors, not elsewhere classified: Secondary | ICD-10-CM | POA: Diagnosis not present

## 2018-12-02 DIAGNOSIS — Z9189 Other specified personal risk factors, not elsewhere classified: Secondary | ICD-10-CM | POA: Diagnosis not present

## 2018-12-09 DIAGNOSIS — Z9189 Other specified personal risk factors, not elsewhere classified: Secondary | ICD-10-CM | POA: Diagnosis not present

## 2018-12-18 DIAGNOSIS — Z9189 Other specified personal risk factors, not elsewhere classified: Secondary | ICD-10-CM | POA: Diagnosis not present

## 2018-12-19 ENCOUNTER — Encounter: Payer: Self-pay | Admitting: Adult Health

## 2018-12-19 ENCOUNTER — Non-Acute Institutional Stay (SKILLED_NURSING_FACILITY): Payer: Medicare Other | Admitting: Adult Health

## 2018-12-19 DIAGNOSIS — G20 Parkinson's disease: Secondary | ICD-10-CM | POA: Diagnosis not present

## 2018-12-19 DIAGNOSIS — F5101 Primary insomnia: Secondary | ICD-10-CM | POA: Diagnosis not present

## 2018-12-19 DIAGNOSIS — R531 Weakness: Secondary | ICD-10-CM | POA: Diagnosis not present

## 2018-12-19 DIAGNOSIS — K5900 Constipation, unspecified: Secondary | ICD-10-CM

## 2018-12-19 DIAGNOSIS — F0280 Dementia in other diseases classified elsewhere without behavioral disturbance: Secondary | ICD-10-CM

## 2018-12-19 DIAGNOSIS — M539 Dorsopathy, unspecified: Secondary | ICD-10-CM

## 2018-12-19 DIAGNOSIS — G3183 Dementia with Lewy bodies: Secondary | ICD-10-CM | POA: Diagnosis not present

## 2018-12-19 DIAGNOSIS — K592 Neurogenic bowel, not elsewhere classified: Secondary | ICD-10-CM

## 2018-12-19 NOTE — Progress Notes (Signed)
Location:  Occupational psychologist of Service:  SNF (31) Provider:   Cindi Carbon, ANP [place] [phone]  Gayland Curry, DO  Patient Care Team: Gayland Curry, DO as PCP - General (Geriatric Medicine) Gaynelle Arabian, MD as Consulting Physician (Orthopedic Surgery) Ricki Rodriguez, MD as Attending Physician (Psychiatry) Martinique, Amy, MD as Consulting Physician (Dermatology) Rana Snare, MD as Consulting Physician (Urology) Nyoka Cowden, Stephannie Li, PA-C as Consulting Physician (Neurology) Duffy, Creola Corn, LCSW as Social Worker (Licensed Clinical Social Worker) Conan Bowens, RN as Registered Nurse (Hospice and Palliative Medicine)  Extended Emergency Contact Information Primary Emergency Contact: Arta Bruce Address: [address]          [place],  [address] Johnnette Litter of [place] Phone: [phone] Work Phone: [phone] Mobile Phone: [phone] Relation: Spouse Secondary Emergency Contact: Deatra James, [place] of [employer] Phone: [phone] Relation: Son  Code Status:  DNR Goals of care: Advanced Directive information Advanced Directives 04/30/2018  Does Patient Have a Medical Advance Directive? Yes  Type of Paramedic of [place];Living will;Out of facility DNR (pink MOST or yellow form)  Does patient want to make changes to medical advance directive? No - Patient declined  Copy of [place] in Chart? Yes - validated most recent copy scanned in chart (See row information)  Would patient like information on creating a medical advance directive? -  Pre-existing out of facility DNR order (yellow form or pink MOST form) Yellow form placed in chart (order not valid for inpatient use);Pink MOST form placed in chart (order not valid for inpatient use)     Chief Complaint  Patient presents with  . Medical Management of Chronic Issues     HPI:  Pt is a 73 y.o. female seen today for medical management of chronic diseases. She resides in the memory care unit due to Lewy body dementia and PD.  The nurse reports that she is weaker today and having increased tremors and choreiform movements. She did not eat breakfast. She did have another episode similar to this several weeks ago and spontaneously improved.  There are no reports of cough or congestion. No urinary symptoms. Bowels are moving well.  Functionally she is ambulatory (prior to today) but needs frequent monitoring to prevent falls.  Vitals are stable  Wt Readings from Last 3 Encounters:  12/19/18 135 lb (61.2 kg)  11/22/18 135 lb 12.8 oz (61.6 kg)  10/08/18 136 lb (61.7 kg)      Past Medical History:  Diagnosis Date  . Abdominal pain, epigastric   . Abnormality of gait   . Anxiety state, unspecified   . Arthritis   . Bladder cancer ([place]) 2011   DR. GRAPEY  . Cervicalgia   . Detrusor instability   . Esophageal reflux   . Flatulence, eructation, and gas pain   . Hiatal hernia   . History of recurrent UTIs   . Irritable bowel syndrome   . Lewy body dementia ([place])   . Lumbago   . Nonspecific elevation of levels of transaminase or lactic acid dehydrogenase (LDH)   . Other abnormal glucose   . Other and unspecified hyperlipidemia   . Other and unspecified hyperlipidemia   . Other chest pain   . Other malaise and fatigue   . Pain in joint, site unspecified   . Palpitations   . Parkinson's disease  Dr Tonye Royalty, [facility]  . Personal history of other disorders of nervous system and sense organs   . PONV (postoperative nausea and vomiting)   . Restless leg syndrome   . Vitamin D deficiency    Past Surgical History:  Procedure Laterality Date  . APPENDECTOMY    . BLADDER TUMOR EXCISION    . BREAST SURGERY     Biopsy-benign  . BUNIONECTOMY     left  . CESAREAN SECTION    . CHEMOTHERAPY     FLUSH-CANCER EXCISED CYSTOSCOPICALLY FOLLOWED BY INSTALLATION OF  CHEMOTHERAPY.  . COLONOSCOPY  2011   neg; Dr Sharlett Iles  . LUMBAR LAMINECTOMY     X 3; last @ age34  . TOTAL HIP ARTHROPLASTY Left 04/12/2012   Procedure: TOTAL HIP ARTHROPLASTY ANTERIOR APPROACH;  Surgeon: Gearlean Alf, MD;  Location: [facility];  Service: Orthopedics;  Laterality: Left;  . TRIGGER FINGER RELEASE     X2  . UPPER GI ENDOSCOPY     hiatal hernia    Allergies  Allergen Reactions  . Omeprazole     REACTION: HIVES Because of a history of documented adverse serious drug reaction;Medi Alert bracelet  is recommended  . Simvastatin     REACTION: LEG CRAMPS  . Venlafaxine     REACTION: nausea    Outpatient Encounter Medications as of 12/19/2018  Medication Sig  . acetaminophen (TYLENOL) 325 MG tablet Take 650 mg by mouth 3 (three) times daily.   . carbidopa-levodopa (SINEMET IR) 25-100 MG per tablet Take 1 tablet by mouth 4 (four) times daily.  . divalproex (DEPAKOTE SPRINKLE) 125 MG capsule Take 250 mg by mouth 2 (two) times daily. Then take 1 tablet by mouth daily at lunch  . donepezil (ARICEPT) 10 MG tablet Take 10 mg at bedtime by mouth.   . lactose free nutrition (BOOST) LIQD Take 237 mLs by mouth daily.  . Melatonin 3 MG TABS Take 6 mg by mouth at bedtime.  Marland Kitchen PARoxetine (PAXIL) 40 MG tablet Take 1 tablet by mouth every morning.  Marland Kitchen QUEtiapine (SEROQUEL) 100 MG tablet Take 1 tablet by mouth at bedtime.  . sennosides-docusate sodium (SENOKOT-S) 8.6-50 MG tablet Take 2 tablets by mouth every other day.  . [DISCONTINUED] RABEprazole (ACIPHEX) 20 MG tablet Take 1 tablet (20 mg total) by mouth daily.   No facility-administered encounter medications on file as of 12/19/2018.     Review of Systems  Unable to perform ROS: Dementia    Immunization History  Administered Date(s) Administered  . Influenza Whole 11/15/2001, 11/08/2009  . Influenza, High Dose Seasonal PF 12/22/2015, 11/24/2016, 12/05/2018  . Influenza,inj,Quad PF,6+ Mos 03/11/2014, 11/23/2014, 11/27/2017  .  Influenza-Unspecified 10/07/2012  . Pneumococcal Conjugate-13 11/23/2014  . Pneumococcal Polysaccharide-23 11/24/2015  . Td 02/09/1992, 07/20/2008, 05/20/2018  . Tdap 05/20/2018  . Zoster 11/14/2010  . Zoster Recombinat (Shingrix) 11/12/2017, 02/18/2018   Pertinent  Health Maintenance Due  Topic Date Due  . COLONOSCOPY  03/21/2020  . INFLUENZA VACCINE  Completed  . DEXA SCAN  Completed  . PNA vac Low Risk Adult  Completed  . MAMMOGRAM  Discontinued   Fall Risk  12/05/2017 11/28/2016 07/12/2016 03/15/2016 11/24/2015  Falls in the past year? No Yes Yes No Yes  Number falls in past yr: - 2 or more 2 or more - 1  Injury with Fall? - No No - Yes  Risk for fall due to : - - - - -   Functional Status Survey:    Vitals:  12/19/18 1146  BP: (!) 148/82  Pulse: 64  Resp: 20  Temp: (!) 97.4 F (36.3 C)  SpO2: 97%  Weight: 135 lb (61.2 kg)   Body mass index is 21.79 kg/m. Physical Exam Vitals signs and nursing note reviewed.  Constitutional:      General: She is not in acute distress.    Appearance: She is not diaphoretic.  HENT:     Head: Normocephalic and atraumatic.     Nose: Nose normal. No congestion.     Mouth/Throat:     Pharynx: Oropharynx is clear. No oropharyngeal exudate.  Eyes:     Conjunctiva/sclera: Conjunctivae normal.     Pupils: Pupils are equal, round, and reactive to light.  Neck:     Vascular: No JVD.  Cardiovascular:     Rate and Rhythm: Normal rate and regular rhythm.     Heart sounds: No murmur.  Pulmonary:     Effort: Pulmonary effort is normal. No respiratory distress.     Breath sounds: Normal breath sounds. No wheezing.  Abdominal:     General: Bowel sounds are normal. There is no distension.     Palpations: Abdomen is soft.     Tenderness: There is no abdominal tenderness.  Musculoskeletal:     Right lower leg: No edema.     Left lower leg: No edema.  Skin:    General: Skin is warm and dry.  Neurological:     General: No focal deficit  present.     Mental Status: She is alert.     Comments: Not able to f/c or answer questions. Resting tremor noted to both hands R>L.  Increased rigidity to BUE and BLE.   Psychiatric:     Comments: flat     Labs reviewed: Recent Labs    06/17/18  NA 143  K 4.2  BUN 21  CREATININE 0.8   Recent Labs    04/08/18  AST 12*  ALT 14  ALKPHOS 57   Recent Labs    04/08/18  WBC 6.5  HGB 14.1  HCT 42  PLT 310   Lab Results  Component Value Date   TSH 1.79 06/17/2018   Lab Results  Component Value Date   HGBA1C 6.0 02/26/2012   Lab Results  Component Value Date   CHOL 193 07/04/2016   HDL 43 07/04/2016   LDLCALC 116 07/04/2016   LDLDIRECT 135.1 02/26/2012   TRIG 171 (A) 07/04/2016   CHOLHDL 5 02/26/2012    Significant Diagnostic Results in last 30 days:  No results found.  Assessment/Plan 1. Weakness ? If this due to progression of disease. Goals of care indicated no hospitalizations. Will order labs to look for any reversible causes.   2. Lewy body dementia without behavioral disturbance ([facility]) Severe in nature with progression noted as above Continues on Aricept and not sure if she is benefiting. She may follow up with neurology, will await there input.   3. PARKINSON'S DISEASE She does appear to have a worsening tremor and rigidity on exam, however, not to the extent that she has had earlier this year. She is no longer seeing her current neurologist, as they moved. Given the advanced nature of her disease I am not sure if she would benefit from continuing to go out see someone. I am going to rule out any other issue such as electrolyte imbalance or infection and if none is identified will consult with her husband.   4. Multilevel degenerative disc disease Pain is  controlled with scheduled tylenol.   5. Primary insomnia No reported issues with sleep, continue melatonin 6 mg qhs  6. 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

## 2018-12-20 DIAGNOSIS — R569 Unspecified convulsions: Secondary | ICD-10-CM | POA: Diagnosis not present

## 2018-12-20 DIAGNOSIS — D649 Anemia, unspecified: Secondary | ICD-10-CM | POA: Diagnosis not present

## 2018-12-20 DIAGNOSIS — Z79899 Other long term (current) drug therapy: Secondary | ICD-10-CM | POA: Diagnosis not present

## 2018-12-20 LAB — BASIC METABOLIC PANEL
BUN: 22 — AB (ref 4–21)
Creatinine: 0.8 (ref 0.5–1.1)
Glucose: 115
Potassium: 4.4 (ref 3.4–5.3)
Sodium: 142 (ref 137–147)

## 2018-12-20 LAB — CBC AND DIFFERENTIAL
HCT: 43 (ref 36–46)
Hemoglobin: 14.7 (ref 12.0–16.0)
Platelets: 262 (ref 150–399)
WBC: 8.7

## 2018-12-20 LAB — HEPATIC FUNCTION PANEL
ALT: 6 — AB (ref 7–35)
AST: 13 (ref 13–35)
Alkaline Phosphatase: 55 (ref 25–125)
Bilirubin, Total: 0.4

## 2018-12-31 DIAGNOSIS — Z9189 Other specified personal risk factors, not elsewhere classified: Secondary | ICD-10-CM | POA: Diagnosis not present

## 2019-01-14 ENCOUNTER — Non-Acute Institutional Stay (SKILLED_NURSING_FACILITY): Payer: Medicare Other | Admitting: Internal Medicine

## 2019-01-14 ENCOUNTER — Encounter: Payer: Self-pay | Admitting: Internal Medicine

## 2019-01-14 DIAGNOSIS — K5900 Constipation, unspecified: Secondary | ICD-10-CM

## 2019-01-14 DIAGNOSIS — G3183 Dementia with Lewy bodies: Secondary | ICD-10-CM

## 2019-01-14 DIAGNOSIS — F0280 Dementia in other diseases classified elsewhere without behavioral disturbance: Secondary | ICD-10-CM

## 2019-01-14 DIAGNOSIS — M545 Low back pain, unspecified: Secondary | ICD-10-CM

## 2019-01-14 DIAGNOSIS — G20 Parkinson's disease: Secondary | ICD-10-CM | POA: Diagnosis not present

## 2019-01-14 DIAGNOSIS — G8929 Other chronic pain: Secondary | ICD-10-CM

## 2019-01-14 DIAGNOSIS — K592 Neurogenic bowel, not elsewhere classified: Secondary | ICD-10-CM

## 2019-01-14 DIAGNOSIS — R258 Other abnormal involuntary movements: Secondary | ICD-10-CM

## 2019-01-14 DIAGNOSIS — F324 Major depressive disorder, single episode, in partial remission: Secondary | ICD-10-CM

## 2019-01-14 DIAGNOSIS — G20A1 Parkinson's disease without dyskinesia, without mention of fluctuations: Secondary | ICD-10-CM

## 2019-01-14 NOTE — Progress Notes (Signed)
Patient ID: Kristina Carr, female   DOB: 06-Dec-1945, 73 y.o.   MRN: [id number]  Location:  [facility] Room Number: [place] of Service:  SNF [phone]) Provider:  Gayland Curry, DO  Patient Care Team: Gayland Curry, DO as PCP - General (Geriatric Medicine) Gaynelle Arabian, MD as Consulting Physician (Orthopedic Surgery) Ricki Rodriguez, MD as Attending Physician (Psychiatry) Martinique, Amy, MD as Consulting Physician (Dermatology) Rana Snare, MD as Consulting Physician (Urology) Nyoka Cowden, Stephannie Li, PA-C as Consulting Physician (Neurology) Duffy, Creola Corn, LCSW as Social Worker (Licensed Clinical Social Worker) Conan Bowens, RN as Registered Nurse [facility] and Palliative Medicine)  Extended Emergency Contact Information Primary Emergency Contact: Arta Bruce Address: [address]          [place], [place] [address] Johnnette Litter of [place] Phone: [phone] Work Phone: [phone] Mobile Phone: [phone] Relation: Spouse Secondary Emergency Contact: Deatra James, [place] of [place] Phone: [phone] Relation: Son  Code Status:  DNR  Goals of care: Advanced Directive information Advanced Directives 01/14/2019  Does Patient Have a Medical Advance Directive? Yes  Type of Advance Directive Out of facility DNR (pink MOST or yellow form)  Does patient want to make changes to medical advance directive? No - Patient declined  Copy of [place] in Chart? -  Would patient like information on creating a medical advance directive? -  Pre-existing out of facility DNR order (yellow form or pink MOST form) Yellow form placed in chart (order not valid for inpatient use);Pink MOST form placed in chart (order not valid for inpatient use)     Chief Complaint  Patient presents with  . Medical Management of Chronic Issues    Routine Visit     HPI:  Pt is a 73  y.o. female seen today for medical management of chronic diseases.  [name] lives in memory care for lewy body dementia and Parkinson's disease.  She is dependent in adls with recent declining ambulation.  She is on palliative care.  [name] has unfortunately developed more choreiform movements about 5 months after we reduced her sinemet last.  She is often found in her bed lying with her feet up almost in a half yoga happy baby position.  Her extremities are writhing around.  Her gait has become much more unsteady again and she requires help of others when ambulating, not just walker as she once was able to use.  When seen today, I found her in the position described and she would not open her eyes, was speaking incomprehensibly with her body moving uncontrollably.  She appeared to be having active hallucinations.  She reported abdominal and back pain, but did not appear tender during palpation of either part.  Bowels moving per staff.  Weight is stable the past few months at 135lbs.  Past Medical History:  Diagnosis Date  . Abdominal pain, epigastric   . Abnormality of gait   . Anxiety state, unspecified   . Arthritis   . Bladder cancer ([place]) 2011   DR. GRAPEY  . Cervicalgia   . Detrusor instability   . Esophageal reflux   . Flatulence, eructation, and gas pain   . Hiatal hernia   . History of recurrent UTIs   . Irritable bowel syndrome   . Lewy body dementia ([place])   . Lumbago   . Nonspecific elevation of levels of transaminase or lactic acid  dehydrogenase (LDH)   . Other abnormal glucose   . Other and unspecified hyperlipidemia   . Other and unspecified hyperlipidemia   . Other chest pain   . Other malaise and fatigue   . Pain in joint, site unspecified   . Palpitations   . Parkinson's disease    Dr Tonye Royalty, [facility]  . Personal history of other disorders of nervous system and sense organs   . PONV (postoperative nausea and vomiting)   . Restless leg syndrome   . Vitamin D deficiency     Past Surgical History:  Procedure Laterality Date  . APPENDECTOMY    . BLADDER TUMOR EXCISION    . BREAST SURGERY     Biopsy-benign  . BUNIONECTOMY     left  . CESAREAN SECTION    . CHEMOTHERAPY     FLUSH-CANCER EXCISED CYSTOSCOPICALLY FOLLOWED BY INSTALLATION OF CHEMOTHERAPY.  . COLONOSCOPY  2011   neg; Dr Sharlett Iles  . LUMBAR LAMINECTOMY     X 3; last @ age34  . TOTAL HIP ARTHROPLASTY Left 04/12/2012   Procedure: TOTAL HIP ARTHROPLASTY ANTERIOR APPROACH;  Surgeon: Gearlean Alf, MD;  Location: [facility];  Service: Orthopedics;  Laterality: Left;  . TRIGGER FINGER RELEASE     X2  . UPPER GI ENDOSCOPY     hiatal hernia    Allergies  Allergen Reactions  . Omeprazole     REACTION: HIVES Because of a history of documented adverse serious drug reaction;Medi Alert bracelet  is recommended  . Simvastatin     REACTION: LEG CRAMPS  . Venlafaxine     REACTION: nausea    Outpatient Encounter Medications as of 01/14/2019  Medication Sig  . acetaminophen (TYLENOL) 325 MG tablet Take 650 mg by mouth 3 (three) times daily.   . carbidopa-levodopa (SINEMET IR) 25-100 MG per tablet Take 1 tablet by mouth 4 (four) times daily.  . divalproex (DEPAKOTE SPRINKLE) 125 MG capsule Take 250 mg by mouth 2 (two) times daily. Then take 1 tablet by mouth daily at lunch  . donepezil (ARICEPT) 10 MG tablet Take 10 mg at bedtime by mouth.   . lactose free nutrition (BOOST) LIQD Take 237 mLs by mouth daily.  . Melatonin 3 MG TABS Take 6 mg by mouth at bedtime.  Marland Kitchen PARoxetine (PAXIL) 20 MG tablet Take 40 mg by mouth daily.  . QUEtiapine (SEROQUEL) 100 MG tablet Take 1 tablet by mouth at bedtime.  . sennosides-docusate sodium (SENOKOT-S) 8.6-50 MG tablet Take 2 tablets by mouth every other day.  . [DISCONTINUED] PARoxetine (PAXIL) 40 MG tablet Take 1 tablet by mouth every morning.  . [DISCONTINUED] RABEprazole (ACIPHEX) 20 MG tablet Take 1 tablet (20 mg total) by mouth daily.   No facility-administered  encounter medications on file as of 01/14/2019.     Review of Systems  Constitutional: Positive for activity change. Negative for appetite change, chills, fever and unexpected weight change.  HENT: Negative for congestion, hearing loss and trouble swallowing.   Eyes: Negative for visual disturbance.  Respiratory: Negative for chest tightness and shortness of breath.   Cardiovascular: Negative for chest pain and leg swelling.  Gastrointestinal: Positive for abdominal pain and constipation. Negative for diarrhea, nausea and vomiting.  Genitourinary: Negative for dysuria.  Musculoskeletal: Positive for back pain and gait problem.  Skin: Negative for color change.    Immunization History  Administered Date(s) Administered  . Influenza Whole 11/15/2001, 11/08/2009  . Influenza, High Dose Seasonal PF 12/22/2015, 11/24/2016, 12/05/2018  . Influenza,inj,Quad  PF,6+ Mos 03/11/2014, 11/23/2014, 11/27/2017  . Influenza-Unspecified 10/07/2012  . Pneumococcal Conjugate-13 11/23/2014  . Pneumococcal Polysaccharide-23 11/24/2015  . Td 02/09/1992, 07/20/2008, 05/20/2018  . Tdap 05/20/2018  . Zoster 11/14/2010  . Zoster Recombinat (Shingrix) 11/12/2017, 02/18/2018   Pertinent  Health Maintenance Due  Topic Date Due  . COLONOSCOPY  03/21/2020  . INFLUENZA VACCINE  Completed  . DEXA SCAN  Completed  . PNA vac Low Risk Adult  Completed  . MAMMOGRAM  Discontinued   Fall Risk  12/05/2017 11/28/2016 07/12/2016 03/15/2016 11/24/2015  Falls in the past year? No Yes Yes No Yes  Number falls in past yr: - 2 or more 2 or more - 1  Injury with Fall? - No No - Yes  Risk for fall due to : - - - - -   Functional Status Survey:    Vitals:   01/14/19 1112  BP: 115/78  Pulse: 65  Resp: 18  Temp: (!) 97 F (36.1 C)  SpO2: 97%  Weight: 135 lb (61.2 kg)  Height: 5\' 6"  (1.676 m)   Body mass index is 21.79 kg/m. Physical Exam Vitals and nursing note reviewed.  Constitutional:      Appearance: She is  normal weight. She is not toxic-appearing.     Comments: Eyes closed, talking out loud, could not get her attention to open eyes/follow commands, but did respond to questions  HENT:     Head: Normocephalic and atraumatic.  Cardiovascular:     Rate and Rhythm: Normal rate and regular rhythm.     Pulses: Normal pulses.     Heart sounds: Normal heart sounds.  Pulmonary:     Effort: Pulmonary effort is normal.     Breath sounds: Normal breath sounds. No wheezing, rhonchi or rales.  Abdominal:     General: Bowel sounds are normal. There is no distension.     Palpations: Abdomen is soft.     Tenderness: There is no abdominal tenderness. There is no guarding or rebound.  Musculoskeletal:     Right lower leg: No edema.     Left lower leg: No edema.     Comments: Positioned holding the outside of her right foot with her right hand; lower body moving on its own  Skin:    General: Skin is warm and dry.  Neurological:     Cranial Nerves: No cranial nerve deficit.     Gait: Gait abnormal.     Comments: Choreiform movements of extremities  Psychiatric:     Comments: Appears to be actively hallucinating     Labs reviewed: Recent Labs    06/17/18 12/20/18  NA 143 142  K 4.2 4.4  BUN 21 22*  CREATININE 0.8 0.8   Recent Labs    04/08/18 12/20/18  AST 12* 13  ALT 14 6*  ALKPHOS 57 55   Recent Labs    04/08/18 12/20/18  WBC 6.5 8.7  HGB 14.1 14.7  HCT 42 43  PLT 310 262   Lab Results  Component Value Date   TSH 1.79 06/17/2018   Lab Results  Component Value Date   HGBA1C 6.0 02/26/2012   Lab Results  Component Value Date   CHOL 193 07/04/2016   HDL 43 07/04/2016   LDLCALC 116 07/04/2016   LDLDIRECT 135.1 02/26/2012   TRIG 171 (A) 07/04/2016   CHOLHDL 5 02/26/2012    Assessment/Plan 1. Choreiform movements -previously these responded to reduction in her sinemet by prior neurologist, Dr. Fayne Mediate recommendation -I'm concerned  that as we reduce her move, her freezing and  tremors will become more disabling, but currently, these movements are incredibly disabling and increasing her fall risk so we will attempt another reduction -if this is not helpful within a couple of weeks, we may need to consider a change to the depakote or seroquel or see if Dr. Carles Collet from neuro locally would be willing to do a virtual visit to evaluate [name] (I will reach out if necessary) -for now, reduce alternating doses of sinemet to 10/100mg  while leaving other two doses as is at 25/100mg   2. PARKINSON'S DISEASE -having more choreiform movements previously felt to be due to excessive dopamine with sinemet as dose had been gradually increased--this got much better, but now worse again -changes as above -[name] is on palliative care for her disease  3. Lewy body dementia without behavioral disturbance ([place]) -continues to have active hallucinations--she was too engrossed in this to be able to participate fully in her visit today -she is definitely declining from the cognitive perspective and gait worsening  4. 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 Depression, major, single episode, in partial remission ([place]) -seemingly in partial remission, but difficult to assess with her current level of cognitive loss and active psychosis from her lewy body dementia  6. Chronic midline low back pain without sciatica -seems to be controlled with tylenol and rest--has historically gotten worse with prolonged standing or walking  Family/ staff Communication: discussed with memory care snf nurse  Labs/tests ordered:  No new  Cataleya Cristina L. Coutney Wildermuth, D.O. [facility] [address], [place] [address] Cell Phone (Mon-Fri 8am-5pm):  [phone] On Call:  [phone] & follow prompts after 5pm & weekends Office Phone:  [phone] Office Fax:  [phone]

## 2019-01-17 DIAGNOSIS — Z9189 Other specified personal risk factors, not elsewhere classified: Secondary | ICD-10-CM | POA: Diagnosis not present

## 2019-01-22 DIAGNOSIS — Z9189 Other specified personal risk factors, not elsewhere classified: Secondary | ICD-10-CM | POA: Diagnosis not present

## 2019-01-27 DIAGNOSIS — Z9189 Other specified personal risk factors, not elsewhere classified: Secondary | ICD-10-CM | POA: Diagnosis not present

## 2019-02-03 DIAGNOSIS — Z9189 Other specified personal risk factors, not elsewhere classified: Secondary | ICD-10-CM | POA: Diagnosis not present

## 2019-02-10 DIAGNOSIS — Z9189 Other specified personal risk factors, not elsewhere classified: Secondary | ICD-10-CM | POA: Diagnosis not present

## 2019-02-11 ENCOUNTER — Non-Acute Institutional Stay (SKILLED_NURSING_FACILITY): Payer: Medicare Other | Admitting: Internal Medicine

## 2019-02-11 ENCOUNTER — Encounter: Payer: Self-pay | Admitting: Internal Medicine

## 2019-02-11 DIAGNOSIS — G20 Parkinson's disease: Secondary | ICD-10-CM | POA: Diagnosis not present

## 2019-02-11 DIAGNOSIS — G3183 Dementia with Lewy bodies: Secondary | ICD-10-CM

## 2019-02-11 DIAGNOSIS — R258 Other abnormal involuntary movements: Secondary | ICD-10-CM

## 2019-02-11 DIAGNOSIS — G20A1 Parkinson's disease without dyskinesia, without mention of fluctuations: Secondary | ICD-10-CM

## 2019-02-11 DIAGNOSIS — F0280 Dementia in other diseases classified elsewhere without behavioral disturbance: Secondary | ICD-10-CM

## 2019-02-11 NOTE — Progress Notes (Signed)
Patient ID: Kristina ARBAIZA, female   DOB: 18-Aug-1945, 74 y.o.   MRN: [id number]  Location:  [facility] Room Number: 315 A Place of Service:  SNF [phone]) Provider:   Gayland Curry, DO  Patient Care Team: Gayland Curry, DO as PCP - General (Geriatric Medicine) Gaynelle Arabian, MD as Consulting Physician (Orthopedic Surgery) Ricki Rodriguez, MD as Attending Physician (Psychiatry) Martinique, Amy, MD as Consulting Physician (Dermatology) Rana Snare, MD as Consulting Physician (Urology) Nyoka Cowden, Stephannie Li, PA-C as Consulting Physician (Neurology) Duffy, Creola Corn, LCSW as Social Worker (Licensed Clinical Social Worker) Conan Bowens, RN as Registered Nurse [facility] and Palliative Medicine)  Extended Emergency Contact Information Primary Emergency Contact: Arta Bruce Address: [address]          [place], [place] [address] Johnnette Litter of [place] Phone: [phone] Work Phone: [phone] Mobile Phone: [phone] Relation: Spouse Secondary Emergency Contact: Deatra James, [place] of [place] Phone: [phone] Relation: Son  Code Status:  DNR, palliative care Goals of care: Advanced Directive information Advanced Directives 01/14/2019  Does Patient Have a Medical Advance Directive? Yes  Type of Advance Directive Out of facility DNR (pink MOST or yellow form)  Does patient want to make changes to medical advance directive? No - Patient declined  Copy of [employer] in Chart? -  Would patient like information on creating a medical advance directive? -  Pre-existing out of facility DNR order (yellow form or pink MOST form) Yellow form placed in chart (order not valid for inpatient use);Pink MOST form placed in chart (order not valid for inpatient use)     Chief Complaint  Patient presents with  . Acute Visit     Eye rolling, increased rigidity    HPI:  Pt is a 74 y.o.  female with Parkinson's disease and Lewy body dementia that has been gradually declining seen today for an acute visit for her eyes rolling and increased rigidity, not looking good, pale, and did not eat her breakfast.  Her regular memory care nurse felt like she was not herself and taking a turn for the worse.  We had decreased her sinemet when I last saw her in hopes it would help her eye rolling and athetosis.  Some of her abnormal movements did improve; however, she continued with eye rolling and was now a lot more rigid, less responsive and interactive and fully dependent in all daily functions.     Past Medical History:  Diagnosis Date  . Abdominal pain, epigastric   . Abnormality of gait   . Anxiety state, unspecified   . Arthritis   . Bladder cancer ([place]) 2011   DR. GRAPEY  . Cervicalgia   . Detrusor instability   . Esophageal reflux   . Flatulence, eructation, and gas pain   . Hiatal hernia   . History of recurrent UTIs   . Irritable bowel syndrome   . Lewy body dementia ([place])   . Lumbago   . Nonspecific elevation of levels of transaminase or lactic acid dehydrogenase (LDH)   . Other abnormal glucose   . Other and unspecified hyperlipidemia   . Other and unspecified hyperlipidemia   . Other chest pain   . Other malaise and fatigue   . Pain in joint, site unspecified   . Palpitations   . Parkinson's disease    Dr Tonye Royalty, [facility]  . Personal history of other  disorders of nervous system and sense organs   . PONV (postoperative nausea and vomiting)   . Restless leg syndrome   . Vitamin D deficiency    Past Surgical History:  Procedure Laterality Date  . APPENDECTOMY    . BLADDER TUMOR EXCISION    . BREAST SURGERY     Biopsy-benign  . BUNIONECTOMY     left  . CESAREAN SECTION    . CHEMOTHERAPY     FLUSH-CANCER EXCISED CYSTOSCOPICALLY FOLLOWED BY INSTALLATION OF CHEMOTHERAPY.  . COLONOSCOPY  2011   neg; Dr Sharlett Iles  . LUMBAR LAMINECTOMY     X 3; last @ age34  . TOTAL  HIP ARTHROPLASTY Left 04/12/2012   Procedure: TOTAL HIP ARTHROPLASTY ANTERIOR APPROACH;  Surgeon: Gearlean Alf, MD;  Location: [facility];  Service: Orthopedics;  Laterality: Left;  . TRIGGER FINGER RELEASE     X2  . UPPER GI ENDOSCOPY     hiatal hernia    Allergies  Allergen Reactions  . Omeprazole     REACTION: HIVES Because of a history of documented adverse serious drug reaction;Medi Alert bracelet  is recommended  . Simvastatin     REACTION: LEG CRAMPS  . Venlafaxine     REACTION: nausea    Outpatient Encounter Medications as of 02/11/2019  Medication Sig  . acetaminophen (TYLENOL) 325 MG tablet Take 650 mg by mouth 3 (three) times daily.   . carbidopa-levodopa (SINEMET IR) 25-100 MG per tablet Take 1 tablet by mouth 4 (four) times daily.  . divalproex (DEPAKOTE SPRINKLE) 125 MG capsule Take 250 mg by mouth 2 (two) times daily. Then take 1 tablet by mouth daily at lunch  . donepezil (ARICEPT) 10 MG tablet Take 10 mg at bedtime by mouth.   . lactose free nutrition (BOOST) LIQD Take 237 mLs by mouth daily.  . Melatonin 3 MG TABS Take 6 mg by mouth at bedtime.  Marland Kitchen PARoxetine (PAXIL) 20 MG tablet Take 40 mg by mouth daily.  . QUEtiapine (SEROQUEL) 100 MG tablet Take 1 tablet by mouth at bedtime.  . sennosides-docusate sodium (SENOKOT-S) 8.6-50 MG tablet Take 2 tablets by mouth every other day.  . [DISCONTINUED] RABEprazole (ACIPHEX) 20 MG tablet Take 1 tablet (20 mg total) by mouth daily.   No facility-administered encounter medications on file as of 02/11/2019.    Review of Systems  Constitutional: Positive for activity change, appetite change and fatigue. Negative for chills, fever and unexpected weight change.  HENT: Negative for sore throat.   Eyes: Negative for visual disturbance.  Respiratory: Negative for chest tightness and shortness of breath.   Cardiovascular: Negative for chest pain, palpitations and leg swelling.  Gastrointestinal: Positive for abdominal pain. Negative  for constipation, diarrhea and nausea.  Genitourinary: Negative for dysuria.  Musculoskeletal: Positive for back pain and gait problem. Negative for arthralgias.  Skin: Positive for pallor.    Immunization History  Administered Date(s) Administered  . Influenza Whole 11/15/2001, 11/08/2009  . Influenza, High Dose Seasonal PF 12/22/2015, 11/24/2016, 12/05/2018  . Influenza,inj,Quad PF,6+ Mos 03/11/2014, 11/23/2014, 11/27/2017  . Influenza-Unspecified 10/07/2012  . Pneumococcal Conjugate-13 11/23/2014  . Pneumococcal Polysaccharide-23 11/24/2015  . Td 02/09/1992, 07/20/2008, 05/20/2018  . Tdap 05/20/2018  . Zoster 11/14/2010  . Zoster Recombinat (Shingrix) 11/12/2017, 02/18/2018   Pertinent  Health Maintenance Due  Topic Date Due  . COLONOSCOPY  03/21/2020  . INFLUENZA VACCINE  Completed  . DEXA SCAN  Completed  . PNA vac Low Risk Adult  Completed  . MAMMOGRAM  Discontinued   Fall Risk  12/05/2017 11/28/2016 07/12/2016 03/15/2016 11/24/2015  Falls in the past year? No Yes Yes No Yes  Number falls in past yr: - 2 or more 2 or more - 1  Injury with Fall? - No No - Yes  Risk for fall due to : - - - - -   Functional Status Survey:    Vitals:   02/11/19 1510  BP: 132/77  Pulse: 67  Temp: (!) 97 F (36.1 C)  SpO2: 95%  Weight: 135 lb (61.2 kg)  Height: 5\' 6"  (1.676 m)   Body mass index is 21.79 kg/m. Physical Exam Vitals reviewed.  Constitutional:      General: She is not in acute distress.    Appearance: She is ill-appearing. She is not toxic-appearing.     Comments: Very pale, not speaking at all, initially sitting in wheelchair but we put her to bed due to lethargy  HENT:     Head: Normocephalic and atraumatic.  Cardiovascular:     Rate and Rhythm: Normal rate and regular rhythm.     Pulses: Normal pulses.     Heart sounds: Normal heart sounds.  Pulmonary:     Effort: Pulmonary effort is normal.     Breath sounds: Normal breath sounds.  Abdominal:     General:  Bowel sounds are normal.     Tenderness: There is abdominal tenderness.     Comments: Generalized tenderness  Musculoskeletal:     Right lower leg: No edema.     Left lower leg: No edema.  Skin:    Coloration: Skin is pale.  Neurological:     Comments: Rigidity of extremities increased on exam and she's not able to follow any commands, lethargic     Labs reviewed: Recent Labs    06/17/18 0000 12/20/18 0000  NA 143 142  K 4.2 4.4  BUN 21 22*  CREATININE 0.8 0.8   Recent Labs    04/08/18 0000 12/20/18 0000  AST 12* 13  ALT 14 6*  ALKPHOS 57 55   Recent Labs    04/08/18 0000 12/20/18 0000  WBC 6.5 8.7  HGB 14.1 14.7  HCT 42 43  PLT 310 262   Lab Results  Component Value Date   TSH 1.79 06/17/2018   Lab Results  Component Value Date   HGBA1C 6.0 02/26/2012   Lab Results  Component Value Date   CHOL 193 07/04/2016   HDL 43 07/04/2016   LDLCALC 116 07/04/2016   LDLDIRECT 135.1 02/26/2012   TRIG 171 (A) 07/04/2016   CHOLHDL 5 02/26/2012    Assessment/Plan 1. Lewy body dementia without behavioral disturbance ([place]) -is advanced, will reduce depakote back to 125mg  po tid from 250bid with 125mg  at lunch b/c she's been so lethargic lately   2. PARKINSON'S DISEASE -continue reduced sinemet due to improvement in #3, but may increase sinemet if she has improvement in alertness on less depakote  3. Choreiform movements -improved except the eye rolling vs before, cont on lower dose sinemet at least for now  Family/ staff Communication:  Discussed with memory care nurse and DON  Labs/tests ordered:  None due to comfort-based goals  Keghan Mcfarren L. Finneas Mathe, D.O. [facility] [address], [place] [address] Cell Phone (Mon-Fri 8am-5pm):  [phone] On Call:  [phone] & follow prompts after 5pm & weekends Office Phone:  [phone] Office Fax:  [phone]

## 2019-02-12 ENCOUNTER — Other Ambulatory Visit: Payer: Self-pay | Admitting: Internal Medicine

## 2019-02-12 DIAGNOSIS — Z515 Encounter for palliative care: Secondary | ICD-10-CM

## 2019-02-12 MED ORDER — MORPHINE SULFATE (CONCENTRATE) 20 MG/ML PO SOLN: 5.0000 mg | ORAL | 0 refills | Status: AC | PRN

## 2019-02-12 MED ORDER — LORAZEPAM 2 MG/ML PO CONC: 0.2500 mg | ORAL | 0 refills | Status: AC | PRN

## 2019-03-10 DEATH — deceased
